# Patient Record
Sex: Male | Born: 1945
Health system: Southern US, Community
[De-identification: ages and names within clinical notes are randomized; demographics above are authoritative.]

## PROBLEM LIST (undated history)

## (undated) DIAGNOSIS — I10 Essential (primary) hypertension: Secondary | ICD-10-CM

## (undated) DIAGNOSIS — I2609 Other pulmonary embolism with acute cor pulmonale: Secondary | ICD-10-CM

## (undated) DIAGNOSIS — E785 Hyperlipidemia, unspecified: Secondary | ICD-10-CM

## (undated) DIAGNOSIS — N179 Acute kidney failure, unspecified: Secondary | ICD-10-CM

## (undated) DIAGNOSIS — J449 Chronic obstructive pulmonary disease, unspecified: Secondary | ICD-10-CM

## (undated) DIAGNOSIS — E669 Obesity, unspecified: Secondary | ICD-10-CM

## (undated) DIAGNOSIS — C649 Malignant neoplasm of unspecified kidney, except renal pelvis: Secondary | ICD-10-CM

## (undated) DIAGNOSIS — I82409 Acute embolism and thrombosis of unspecified deep veins of unspecified lower extremity: Secondary | ICD-10-CM

## (undated) DIAGNOSIS — I5033 Acute on chronic diastolic (congestive) heart failure: Secondary | ICD-10-CM

## (undated) DIAGNOSIS — M1990 Unspecified osteoarthritis, unspecified site: Secondary | ICD-10-CM

## (undated) DIAGNOSIS — Z86711 Personal history of pulmonary embolism: Secondary | ICD-10-CM

## (undated) DIAGNOSIS — M109 Gout, unspecified: Secondary | ICD-10-CM

## (undated) DIAGNOSIS — G4733 Obstructive sleep apnea (adult) (pediatric): Secondary | ICD-10-CM

## (undated) HISTORY — DX: Acute on chronic diastolic (congestive) heart failure: I50.33

## (undated) HISTORY — DX: Gout, unspecified: M10.9

## (undated) HISTORY — DX: Acute embolism and thrombosis of unspecified deep veins of unspecified lower extremity: I82.409

## (undated) HISTORY — DX: Essential (primary) hypertension: I10

## (undated) HISTORY — DX: Other pulmonary embolism with acute cor pulmonale: I26.09

## (undated) HISTORY — DX: Hyperlipidemia, unspecified: E78.5

## (undated) HISTORY — DX: Chronic obstructive pulmonary disease, unspecified: J44.9

## (undated) HISTORY — DX: Unspecified osteoarthritis, unspecified site: M19.90

## (undated) HISTORY — PX: NEPHRECTOMY: SHX65

## (undated) HISTORY — DX: Personal history of pulmonary embolism: Z86.711

## (undated) HISTORY — DX: Obstructive sleep apnea (adult) (pediatric): G47.33

## (undated) HISTORY — DX: Obesity, unspecified: E66.9

## (undated) HISTORY — DX: Malignant neoplasm of unspecified kidney, except renal pelvis: C64.9

## (undated) HISTORY — DX: Acute kidney failure, unspecified: N17.9

---

## 1997-07-17 ENCOUNTER — Emergency Department (HOSPITAL_COMMUNITY): Admission: EM | Admit: 1997-07-17 | Discharge: 1997-07-17 | Payer: Self-pay | Admitting: Emergency Medicine

## 1997-07-19 ENCOUNTER — Inpatient Hospital Stay (HOSPITAL_COMMUNITY): Admission: EM | Admit: 1997-07-19 | Discharge: 1997-07-21 | Payer: Self-pay | Admitting: Emergency Medicine

## 2012-04-13 DIAGNOSIS — E785 Hyperlipidemia, unspecified: Secondary | ICD-10-CM | POA: Diagnosis not present

## 2012-04-13 DIAGNOSIS — Z1212 Encounter for screening for malignant neoplasm of rectum: Secondary | ICD-10-CM | POA: Diagnosis not present

## 2012-04-13 DIAGNOSIS — J449 Chronic obstructive pulmonary disease, unspecified: Secondary | ICD-10-CM | POA: Diagnosis not present

## 2012-04-13 DIAGNOSIS — I10 Essential (primary) hypertension: Secondary | ICD-10-CM | POA: Diagnosis not present

## 2012-04-13 DIAGNOSIS — R3911 Hesitancy of micturition: Secondary | ICD-10-CM | POA: Diagnosis not present

## 2012-04-13 DIAGNOSIS — F17200 Nicotine dependence, unspecified, uncomplicated: Secondary | ICD-10-CM | POA: Diagnosis not present

## 2012-05-13 DIAGNOSIS — M25569 Pain in unspecified knee: Secondary | ICD-10-CM | POA: Diagnosis not present

## 2012-05-13 DIAGNOSIS — S33101A Dislocation of unspecified lumbar vertebra, initial encounter: Secondary | ICD-10-CM | POA: Diagnosis not present

## 2012-05-13 DIAGNOSIS — M62838 Other muscle spasm: Secondary | ICD-10-CM | POA: Diagnosis not present

## 2012-06-10 DIAGNOSIS — M62838 Other muscle spasm: Secondary | ICD-10-CM | POA: Diagnosis not present

## 2012-06-10 DIAGNOSIS — M25569 Pain in unspecified knee: Secondary | ICD-10-CM | POA: Diagnosis not present

## 2012-06-10 DIAGNOSIS — S33101A Dislocation of unspecified lumbar vertebra, initial encounter: Secondary | ICD-10-CM | POA: Diagnosis not present

## 2012-08-05 DIAGNOSIS — M62838 Other muscle spasm: Secondary | ICD-10-CM | POA: Diagnosis not present

## 2012-08-05 DIAGNOSIS — M25569 Pain in unspecified knee: Secondary | ICD-10-CM | POA: Diagnosis not present

## 2012-08-05 DIAGNOSIS — S33101A Dislocation of unspecified lumbar vertebra, initial encounter: Secondary | ICD-10-CM | POA: Diagnosis not present

## 2012-09-02 DIAGNOSIS — M62838 Other muscle spasm: Secondary | ICD-10-CM | POA: Diagnosis not present

## 2012-09-02 DIAGNOSIS — S33101A Dislocation of unspecified lumbar vertebra, initial encounter: Secondary | ICD-10-CM | POA: Diagnosis not present

## 2012-09-02 DIAGNOSIS — M25569 Pain in unspecified knee: Secondary | ICD-10-CM | POA: Diagnosis not present

## 2012-09-04 DIAGNOSIS — S33101A Dislocation of unspecified lumbar vertebra, initial encounter: Secondary | ICD-10-CM | POA: Diagnosis not present

## 2012-09-04 DIAGNOSIS — M25569 Pain in unspecified knee: Secondary | ICD-10-CM | POA: Diagnosis not present

## 2012-09-04 DIAGNOSIS — S339XXA Sprain of unspecified parts of lumbar spine and pelvis, initial encounter: Secondary | ICD-10-CM | POA: Diagnosis not present

## 2012-09-04 DIAGNOSIS — M62838 Other muscle spasm: Secondary | ICD-10-CM | POA: Diagnosis not present

## 2012-09-09 DIAGNOSIS — M62838 Other muscle spasm: Secondary | ICD-10-CM | POA: Diagnosis not present

## 2012-09-09 DIAGNOSIS — S339XXA Sprain of unspecified parts of lumbar spine and pelvis, initial encounter: Secondary | ICD-10-CM | POA: Diagnosis not present

## 2012-09-09 DIAGNOSIS — S33101A Dislocation of unspecified lumbar vertebra, initial encounter: Secondary | ICD-10-CM | POA: Diagnosis not present

## 2012-09-09 DIAGNOSIS — M25569 Pain in unspecified knee: Secondary | ICD-10-CM | POA: Diagnosis not present

## 2012-09-16 DIAGNOSIS — M62838 Other muscle spasm: Secondary | ICD-10-CM | POA: Diagnosis not present

## 2012-09-16 DIAGNOSIS — S339XXA Sprain of unspecified parts of lumbar spine and pelvis, initial encounter: Secondary | ICD-10-CM | POA: Diagnosis not present

## 2012-09-16 DIAGNOSIS — M25569 Pain in unspecified knee: Secondary | ICD-10-CM | POA: Diagnosis not present

## 2012-09-16 DIAGNOSIS — S33101A Dislocation of unspecified lumbar vertebra, initial encounter: Secondary | ICD-10-CM | POA: Diagnosis not present

## 2012-10-14 DIAGNOSIS — S33101A Dislocation of unspecified lumbar vertebra, initial encounter: Secondary | ICD-10-CM | POA: Diagnosis not present

## 2012-10-14 DIAGNOSIS — M62838 Other muscle spasm: Secondary | ICD-10-CM | POA: Diagnosis not present

## 2012-10-14 DIAGNOSIS — M25569 Pain in unspecified knee: Secondary | ICD-10-CM | POA: Diagnosis not present

## 2012-11-11 DIAGNOSIS — S33101A Dislocation of unspecified lumbar vertebra, initial encounter: Secondary | ICD-10-CM | POA: Diagnosis not present

## 2012-11-11 DIAGNOSIS — M62838 Other muscle spasm: Secondary | ICD-10-CM | POA: Diagnosis not present

## 2012-11-11 DIAGNOSIS — M25569 Pain in unspecified knee: Secondary | ICD-10-CM | POA: Diagnosis not present

## 2012-12-09 DIAGNOSIS — M25569 Pain in unspecified knee: Secondary | ICD-10-CM | POA: Diagnosis not present

## 2012-12-09 DIAGNOSIS — M62838 Other muscle spasm: Secondary | ICD-10-CM | POA: Diagnosis not present

## 2012-12-09 DIAGNOSIS — S33101A Dislocation of unspecified lumbar vertebra, initial encounter: Secondary | ICD-10-CM | POA: Diagnosis not present

## 2013-01-06 DIAGNOSIS — S339XXA Sprain of unspecified parts of lumbar spine and pelvis, initial encounter: Secondary | ICD-10-CM | POA: Diagnosis not present

## 2013-01-06 DIAGNOSIS — M62838 Other muscle spasm: Secondary | ICD-10-CM | POA: Diagnosis not present

## 2013-01-06 DIAGNOSIS — S33101A Dislocation of unspecified lumbar vertebra, initial encounter: Secondary | ICD-10-CM | POA: Diagnosis not present

## 2013-01-06 DIAGNOSIS — M25569 Pain in unspecified knee: Secondary | ICD-10-CM | POA: Diagnosis not present

## 2013-01-16 DIAGNOSIS — K219 Gastro-esophageal reflux disease without esophagitis: Secondary | ICD-10-CM | POA: Diagnosis present

## 2013-01-16 DIAGNOSIS — E785 Hyperlipidemia, unspecified: Secondary | ICD-10-CM | POA: Diagnosis present

## 2013-01-16 DIAGNOSIS — E279 Disorder of adrenal gland, unspecified: Secondary | ICD-10-CM | POA: Diagnosis present

## 2013-01-16 DIAGNOSIS — R319 Hematuria, unspecified: Secondary | ICD-10-CM | POA: Diagnosis not present

## 2013-01-16 DIAGNOSIS — I119 Hypertensive heart disease without heart failure: Secondary | ICD-10-CM | POA: Diagnosis not present

## 2013-01-16 DIAGNOSIS — R0602 Shortness of breath: Secondary | ICD-10-CM | POA: Diagnosis not present

## 2013-01-16 DIAGNOSIS — J982 Interstitial emphysema: Secondary | ICD-10-CM | POA: Diagnosis not present

## 2013-01-16 DIAGNOSIS — J449 Chronic obstructive pulmonary disease, unspecified: Secondary | ICD-10-CM | POA: Diagnosis not present

## 2013-01-16 DIAGNOSIS — R0902 Hypoxemia: Secondary | ICD-10-CM | POA: Diagnosis not present

## 2013-01-16 DIAGNOSIS — J189 Pneumonia, unspecified organism: Secondary | ICD-10-CM | POA: Diagnosis not present

## 2013-01-16 DIAGNOSIS — J159 Unspecified bacterial pneumonia: Secondary | ICD-10-CM | POA: Diagnosis not present

## 2013-01-16 DIAGNOSIS — N289 Disorder of kidney and ureter, unspecified: Secondary | ICD-10-CM | POA: Diagnosis not present

## 2013-01-16 DIAGNOSIS — J441 Chronic obstructive pulmonary disease with (acute) exacerbation: Secondary | ICD-10-CM | POA: Diagnosis not present

## 2013-01-16 DIAGNOSIS — Z79899 Other long term (current) drug therapy: Secondary | ICD-10-CM | POA: Diagnosis not present

## 2013-01-16 DIAGNOSIS — J4489 Other specified chronic obstructive pulmonary disease: Secondary | ICD-10-CM | POA: Diagnosis not present

## 2013-01-16 DIAGNOSIS — F17200 Nicotine dependence, unspecified, uncomplicated: Secondary | ICD-10-CM | POA: Diagnosis present

## 2013-01-16 DIAGNOSIS — R0609 Other forms of dyspnea: Secondary | ICD-10-CM | POA: Diagnosis not present

## 2013-01-16 DIAGNOSIS — C649 Malignant neoplasm of unspecified kidney, except renal pelvis: Secondary | ICD-10-CM | POA: Diagnosis not present

## 2013-01-16 DIAGNOSIS — J9600 Acute respiratory failure, unspecified whether with hypoxia or hypercapnia: Secondary | ICD-10-CM | POA: Diagnosis not present

## 2013-01-22 DIAGNOSIS — J449 Chronic obstructive pulmonary disease, unspecified: Secondary | ICD-10-CM | POA: Diagnosis not present

## 2013-01-22 DIAGNOSIS — E785 Hyperlipidemia, unspecified: Secondary | ICD-10-CM | POA: Diagnosis not present

## 2013-01-22 DIAGNOSIS — R7309 Other abnormal glucose: Secondary | ICD-10-CM | POA: Diagnosis not present

## 2013-01-22 DIAGNOSIS — J189 Pneumonia, unspecified organism: Secondary | ICD-10-CM | POA: Diagnosis not present

## 2013-01-22 DIAGNOSIS — I10 Essential (primary) hypertension: Secondary | ICD-10-CM | POA: Diagnosis not present

## 2013-02-01 DIAGNOSIS — N289 Disorder of kidney and ureter, unspecified: Secondary | ICD-10-CM | POA: Diagnosis not present

## 2013-02-01 DIAGNOSIS — N401 Enlarged prostate with lower urinary tract symptoms: Secondary | ICD-10-CM | POA: Diagnosis not present

## 2013-02-03 DIAGNOSIS — S33101A Dislocation of unspecified lumbar vertebra, initial encounter: Secondary | ICD-10-CM | POA: Diagnosis not present

## 2013-02-03 DIAGNOSIS — M25569 Pain in unspecified knee: Secondary | ICD-10-CM | POA: Diagnosis not present

## 2013-02-03 DIAGNOSIS — M62838 Other muscle spasm: Secondary | ICD-10-CM | POA: Diagnosis not present

## 2013-02-23 DIAGNOSIS — I10 Essential (primary) hypertension: Secondary | ICD-10-CM | POA: Diagnosis not present

## 2013-03-03 DIAGNOSIS — S33101A Dislocation of unspecified lumbar vertebra, initial encounter: Secondary | ICD-10-CM | POA: Diagnosis not present

## 2013-03-03 DIAGNOSIS — M25569 Pain in unspecified knee: Secondary | ICD-10-CM | POA: Diagnosis not present

## 2013-03-03 DIAGNOSIS — M62838 Other muscle spasm: Secondary | ICD-10-CM | POA: Diagnosis not present

## 2013-03-11 DIAGNOSIS — N2889 Other specified disorders of kidney and ureter: Secondary | ICD-10-CM

## 2013-03-11 HISTORY — DX: Other specified disorders of kidney and ureter: N28.89

## 2013-03-30 DIAGNOSIS — N289 Disorder of kidney and ureter, unspecified: Secondary | ICD-10-CM | POA: Diagnosis not present

## 2013-03-30 DIAGNOSIS — Z01810 Encounter for preprocedural cardiovascular examination: Secondary | ICD-10-CM | POA: Diagnosis not present

## 2013-03-31 DIAGNOSIS — M25569 Pain in unspecified knee: Secondary | ICD-10-CM | POA: Diagnosis not present

## 2013-03-31 DIAGNOSIS — M62838 Other muscle spasm: Secondary | ICD-10-CM | POA: Diagnosis not present

## 2013-03-31 DIAGNOSIS — S33101A Dislocation of unspecified lumbar vertebra, initial encounter: Secondary | ICD-10-CM | POA: Diagnosis not present

## 2013-04-02 DIAGNOSIS — D4100 Neoplasm of uncertain behavior of unspecified kidney: Secondary | ICD-10-CM | POA: Diagnosis not present

## 2013-04-02 DIAGNOSIS — C649 Malignant neoplasm of unspecified kidney, except renal pelvis: Secondary | ICD-10-CM | POA: Diagnosis not present

## 2013-04-20 DIAGNOSIS — J449 Chronic obstructive pulmonary disease, unspecified: Secondary | ICD-10-CM

## 2013-04-20 DIAGNOSIS — C649 Malignant neoplasm of unspecified kidney, except renal pelvis: Secondary | ICD-10-CM

## 2013-04-20 DIAGNOSIS — N289 Disorder of kidney and ureter, unspecified: Secondary | ICD-10-CM | POA: Diagnosis not present

## 2013-04-20 DIAGNOSIS — F1010 Alcohol abuse, uncomplicated: Secondary | ICD-10-CM | POA: Insufficient documentation

## 2013-04-20 HISTORY — DX: Chronic obstructive pulmonary disease, unspecified: J44.9

## 2013-04-20 HISTORY — DX: Malignant neoplasm of unspecified kidney, except renal pelvis: C64.9

## 2013-04-20 HISTORY — DX: Alcohol abuse, uncomplicated: F10.10

## 2013-04-28 DIAGNOSIS — S33101A Dislocation of unspecified lumbar vertebra, initial encounter: Secondary | ICD-10-CM | POA: Diagnosis not present

## 2013-04-28 DIAGNOSIS — S339XXA Sprain of unspecified parts of lumbar spine and pelvis, initial encounter: Secondary | ICD-10-CM | POA: Diagnosis not present

## 2013-04-28 DIAGNOSIS — M62838 Other muscle spasm: Secondary | ICD-10-CM | POA: Diagnosis not present

## 2013-04-28 DIAGNOSIS — M25569 Pain in unspecified knee: Secondary | ICD-10-CM | POA: Diagnosis not present

## 2013-04-29 DIAGNOSIS — Z09 Encounter for follow-up examination after completed treatment for conditions other than malignant neoplasm: Secondary | ICD-10-CM | POA: Diagnosis not present

## 2013-04-29 DIAGNOSIS — Z9089 Acquired absence of other organs: Secondary | ICD-10-CM | POA: Diagnosis not present

## 2013-04-29 DIAGNOSIS — Z85528 Personal history of other malignant neoplasm of kidney: Secondary | ICD-10-CM | POA: Diagnosis not present

## 2013-04-29 DIAGNOSIS — Z905 Acquired absence of kidney: Secondary | ICD-10-CM | POA: Diagnosis not present

## 2013-04-29 DIAGNOSIS — C649 Malignant neoplasm of unspecified kidney, except renal pelvis: Secondary | ICD-10-CM | POA: Diagnosis not present

## 2013-04-29 DIAGNOSIS — N289 Disorder of kidney and ureter, unspecified: Secondary | ICD-10-CM | POA: Diagnosis not present

## 2013-04-29 DIAGNOSIS — J449 Chronic obstructive pulmonary disease, unspecified: Secondary | ICD-10-CM | POA: Diagnosis not present

## 2013-05-11 DIAGNOSIS — Z1289 Encounter for screening for malignant neoplasm of other sites: Secondary | ICD-10-CM | POA: Diagnosis not present

## 2013-05-11 DIAGNOSIS — E079 Disorder of thyroid, unspecified: Secondary | ICD-10-CM | POA: Diagnosis not present

## 2013-05-11 DIAGNOSIS — Z905 Acquired absence of kidney: Secondary | ICD-10-CM | POA: Diagnosis not present

## 2013-05-11 DIAGNOSIS — C649 Malignant neoplasm of unspecified kidney, except renal pelvis: Secondary | ICD-10-CM | POA: Diagnosis not present

## 2013-05-11 DIAGNOSIS — Z0389 Encounter for observation for other suspected diseases and conditions ruled out: Secondary | ICD-10-CM | POA: Diagnosis not present

## 2013-06-01 DIAGNOSIS — E785 Hyperlipidemia, unspecified: Secondary | ICD-10-CM | POA: Diagnosis not present

## 2013-06-01 DIAGNOSIS — J449 Chronic obstructive pulmonary disease, unspecified: Secondary | ICD-10-CM | POA: Diagnosis not present

## 2013-06-01 DIAGNOSIS — M25569 Pain in unspecified knee: Secondary | ICD-10-CM | POA: Diagnosis not present

## 2013-06-01 DIAGNOSIS — I10 Essential (primary) hypertension: Secondary | ICD-10-CM | POA: Diagnosis not present

## 2013-06-01 DIAGNOSIS — M25469 Effusion, unspecified knee: Secondary | ICD-10-CM | POA: Diagnosis not present

## 2013-06-01 DIAGNOSIS — S99919A Unspecified injury of unspecified ankle, initial encounter: Secondary | ICD-10-CM | POA: Diagnosis not present

## 2013-06-01 DIAGNOSIS — R937 Abnormal findings on diagnostic imaging of other parts of musculoskeletal system: Secondary | ICD-10-CM | POA: Diagnosis not present

## 2013-06-01 DIAGNOSIS — M129 Arthropathy, unspecified: Secondary | ICD-10-CM | POA: Diagnosis not present

## 2013-06-01 DIAGNOSIS — S8990XA Unspecified injury of unspecified lower leg, initial encounter: Secondary | ICD-10-CM | POA: Diagnosis not present

## 2013-06-01 DIAGNOSIS — N182 Chronic kidney disease, stage 2 (mild): Secondary | ICD-10-CM | POA: Diagnosis not present

## 2013-06-23 DIAGNOSIS — I10 Essential (primary) hypertension: Secondary | ICD-10-CM | POA: Diagnosis not present

## 2013-06-23 DIAGNOSIS — E785 Hyperlipidemia, unspecified: Secondary | ICD-10-CM | POA: Diagnosis not present

## 2013-06-23 DIAGNOSIS — J449 Chronic obstructive pulmonary disease, unspecified: Secondary | ICD-10-CM | POA: Diagnosis not present

## 2013-06-23 DIAGNOSIS — Z905 Acquired absence of kidney: Secondary | ICD-10-CM | POA: Diagnosis not present

## 2013-06-23 DIAGNOSIS — I498 Other specified cardiac arrhythmias: Secondary | ICD-10-CM | POA: Diagnosis not present

## 2013-07-05 DIAGNOSIS — R011 Cardiac murmur, unspecified: Secondary | ICD-10-CM | POA: Diagnosis not present

## 2013-07-28 DIAGNOSIS — M999 Biomechanical lesion, unspecified: Secondary | ICD-10-CM | POA: Diagnosis not present

## 2013-07-28 DIAGNOSIS — S339XXA Sprain of unspecified parts of lumbar spine and pelvis, initial encounter: Secondary | ICD-10-CM | POA: Diagnosis not present

## 2013-07-30 DIAGNOSIS — M999 Biomechanical lesion, unspecified: Secondary | ICD-10-CM | POA: Diagnosis not present

## 2013-07-30 DIAGNOSIS — S339XXA Sprain of unspecified parts of lumbar spine and pelvis, initial encounter: Secondary | ICD-10-CM | POA: Diagnosis not present

## 2013-08-04 DIAGNOSIS — M999 Biomechanical lesion, unspecified: Secondary | ICD-10-CM | POA: Diagnosis not present

## 2013-08-04 DIAGNOSIS — S339XXA Sprain of unspecified parts of lumbar spine and pelvis, initial encounter: Secondary | ICD-10-CM | POA: Diagnosis not present

## 2013-08-06 DIAGNOSIS — M999 Biomechanical lesion, unspecified: Secondary | ICD-10-CM | POA: Diagnosis not present

## 2013-08-06 DIAGNOSIS — S339XXA Sprain of unspecified parts of lumbar spine and pelvis, initial encounter: Secondary | ICD-10-CM | POA: Diagnosis not present

## 2013-08-11 DIAGNOSIS — S339XXA Sprain of unspecified parts of lumbar spine and pelvis, initial encounter: Secondary | ICD-10-CM | POA: Diagnosis not present

## 2013-08-11 DIAGNOSIS — M999 Biomechanical lesion, unspecified: Secondary | ICD-10-CM | POA: Diagnosis not present

## 2013-08-13 DIAGNOSIS — M999 Biomechanical lesion, unspecified: Secondary | ICD-10-CM | POA: Diagnosis not present

## 2013-08-13 DIAGNOSIS — S339XXA Sprain of unspecified parts of lumbar spine and pelvis, initial encounter: Secondary | ICD-10-CM | POA: Diagnosis not present

## 2013-08-18 DIAGNOSIS — S339XXA Sprain of unspecified parts of lumbar spine and pelvis, initial encounter: Secondary | ICD-10-CM | POA: Diagnosis not present

## 2013-08-18 DIAGNOSIS — M999 Biomechanical lesion, unspecified: Secondary | ICD-10-CM | POA: Diagnosis not present

## 2013-08-20 DIAGNOSIS — S339XXA Sprain of unspecified parts of lumbar spine and pelvis, initial encounter: Secondary | ICD-10-CM | POA: Diagnosis not present

## 2013-08-20 DIAGNOSIS — M999 Biomechanical lesion, unspecified: Secondary | ICD-10-CM | POA: Diagnosis not present

## 2013-08-24 DIAGNOSIS — M25449 Effusion, unspecified hand: Secondary | ICD-10-CM | POA: Diagnosis not present

## 2013-08-24 DIAGNOSIS — Z85528 Personal history of other malignant neoplasm of kidney: Secondary | ICD-10-CM | POA: Diagnosis not present

## 2013-08-24 DIAGNOSIS — C649 Malignant neoplasm of unspecified kidney, except renal pelvis: Secondary | ICD-10-CM | POA: Diagnosis not present

## 2013-08-24 DIAGNOSIS — Q348 Other specified congenital malformations of respiratory system: Secondary | ICD-10-CM | POA: Diagnosis not present

## 2013-08-24 DIAGNOSIS — Z0389 Encounter for observation for other suspected diseases and conditions ruled out: Secondary | ICD-10-CM | POA: Diagnosis not present

## 2013-08-24 DIAGNOSIS — K7689 Other specified diseases of liver: Secondary | ICD-10-CM | POA: Diagnosis not present

## 2013-08-24 DIAGNOSIS — Z483 Aftercare following surgery for neoplasm: Secondary | ICD-10-CM | POA: Diagnosis not present

## 2013-08-25 DIAGNOSIS — S339XXA Sprain of unspecified parts of lumbar spine and pelvis, initial encounter: Secondary | ICD-10-CM | POA: Diagnosis not present

## 2013-08-25 DIAGNOSIS — M999 Biomechanical lesion, unspecified: Secondary | ICD-10-CM | POA: Diagnosis not present

## 2013-08-27 DIAGNOSIS — S339XXA Sprain of unspecified parts of lumbar spine and pelvis, initial encounter: Secondary | ICD-10-CM | POA: Diagnosis not present

## 2013-08-27 DIAGNOSIS — M999 Biomechanical lesion, unspecified: Secondary | ICD-10-CM | POA: Diagnosis not present

## 2013-09-01 DIAGNOSIS — S339XXA Sprain of unspecified parts of lumbar spine and pelvis, initial encounter: Secondary | ICD-10-CM | POA: Diagnosis not present

## 2013-09-01 DIAGNOSIS — M999 Biomechanical lesion, unspecified: Secondary | ICD-10-CM | POA: Diagnosis not present

## 2013-09-02 DIAGNOSIS — C649 Malignant neoplasm of unspecified kidney, except renal pelvis: Secondary | ICD-10-CM | POA: Diagnosis not present

## 2013-09-02 DIAGNOSIS — I10 Essential (primary) hypertension: Secondary | ICD-10-CM | POA: Diagnosis not present

## 2013-09-02 DIAGNOSIS — M1990 Unspecified osteoarthritis, unspecified site: Secondary | ICD-10-CM | POA: Diagnosis not present

## 2013-09-03 DIAGNOSIS — S339XXA Sprain of unspecified parts of lumbar spine and pelvis, initial encounter: Secondary | ICD-10-CM | POA: Diagnosis not present

## 2013-09-03 DIAGNOSIS — M999 Biomechanical lesion, unspecified: Secondary | ICD-10-CM | POA: Diagnosis not present

## 2013-09-08 DIAGNOSIS — S339XXA Sprain of unspecified parts of lumbar spine and pelvis, initial encounter: Secondary | ICD-10-CM | POA: Diagnosis not present

## 2013-09-08 DIAGNOSIS — M999 Biomechanical lesion, unspecified: Secondary | ICD-10-CM | POA: Diagnosis not present

## 2013-09-10 DIAGNOSIS — M999 Biomechanical lesion, unspecified: Secondary | ICD-10-CM | POA: Diagnosis not present

## 2013-09-10 DIAGNOSIS — S339XXA Sprain of unspecified parts of lumbar spine and pelvis, initial encounter: Secondary | ICD-10-CM | POA: Diagnosis not present

## 2013-09-15 DIAGNOSIS — M999 Biomechanical lesion, unspecified: Secondary | ICD-10-CM | POA: Diagnosis not present

## 2013-09-15 DIAGNOSIS — S339XXA Sprain of unspecified parts of lumbar spine and pelvis, initial encounter: Secondary | ICD-10-CM | POA: Diagnosis not present

## 2013-09-17 DIAGNOSIS — M999 Biomechanical lesion, unspecified: Secondary | ICD-10-CM | POA: Diagnosis not present

## 2013-09-17 DIAGNOSIS — S339XXA Sprain of unspecified parts of lumbar spine and pelvis, initial encounter: Secondary | ICD-10-CM | POA: Diagnosis not present

## 2013-09-22 DIAGNOSIS — S339XXA Sprain of unspecified parts of lumbar spine and pelvis, initial encounter: Secondary | ICD-10-CM | POA: Diagnosis not present

## 2013-09-22 DIAGNOSIS — M999 Biomechanical lesion, unspecified: Secondary | ICD-10-CM | POA: Diagnosis not present

## 2013-10-06 DIAGNOSIS — M999 Biomechanical lesion, unspecified: Secondary | ICD-10-CM | POA: Diagnosis not present

## 2013-10-06 DIAGNOSIS — S339XXA Sprain of unspecified parts of lumbar spine and pelvis, initial encounter: Secondary | ICD-10-CM | POA: Diagnosis not present

## 2013-10-12 DIAGNOSIS — Z905 Acquired absence of kidney: Secondary | ICD-10-CM | POA: Diagnosis not present

## 2013-10-12 DIAGNOSIS — N289 Disorder of kidney and ureter, unspecified: Secondary | ICD-10-CM | POA: Diagnosis not present

## 2013-10-12 DIAGNOSIS — C649 Malignant neoplasm of unspecified kidney, except renal pelvis: Secondary | ICD-10-CM | POA: Diagnosis not present

## 2013-10-13 DIAGNOSIS — S339XXA Sprain of unspecified parts of lumbar spine and pelvis, initial encounter: Secondary | ICD-10-CM | POA: Diagnosis not present

## 2013-10-13 DIAGNOSIS — M999 Biomechanical lesion, unspecified: Secondary | ICD-10-CM | POA: Diagnosis not present

## 2013-10-19 DIAGNOSIS — C649 Malignant neoplasm of unspecified kidney, except renal pelvis: Secondary | ICD-10-CM | POA: Diagnosis not present

## 2013-10-19 DIAGNOSIS — N289 Disorder of kidney and ureter, unspecified: Secondary | ICD-10-CM | POA: Diagnosis not present

## 2013-10-19 DIAGNOSIS — Z85528 Personal history of other malignant neoplasm of kidney: Secondary | ICD-10-CM | POA: Diagnosis not present

## 2013-10-19 DIAGNOSIS — Z905 Acquired absence of kidney: Secondary | ICD-10-CM | POA: Diagnosis not present

## 2013-10-19 DIAGNOSIS — Z483 Aftercare following surgery for neoplasm: Secondary | ICD-10-CM | POA: Diagnosis not present

## 2013-10-27 DIAGNOSIS — M999 Biomechanical lesion, unspecified: Secondary | ICD-10-CM | POA: Diagnosis not present

## 2013-10-27 DIAGNOSIS — S339XXA Sprain of unspecified parts of lumbar spine and pelvis, initial encounter: Secondary | ICD-10-CM | POA: Diagnosis not present

## 2013-11-10 DIAGNOSIS — S339XXA Sprain of unspecified parts of lumbar spine and pelvis, initial encounter: Secondary | ICD-10-CM | POA: Diagnosis not present

## 2013-11-10 DIAGNOSIS — M999 Biomechanical lesion, unspecified: Secondary | ICD-10-CM | POA: Diagnosis not present

## 2013-12-08 DIAGNOSIS — S339XXA Sprain of unspecified parts of lumbar spine and pelvis, initial encounter: Secondary | ICD-10-CM | POA: Diagnosis not present

## 2013-12-08 DIAGNOSIS — M999 Biomechanical lesion, unspecified: Secondary | ICD-10-CM | POA: Diagnosis not present

## 2013-12-09 DIAGNOSIS — Z23 Encounter for immunization: Secondary | ICD-10-CM | POA: Diagnosis not present

## 2013-12-09 DIAGNOSIS — I10 Essential (primary) hypertension: Secondary | ICD-10-CM | POA: Diagnosis not present

## 2013-12-21 DIAGNOSIS — C649 Malignant neoplasm of unspecified kidney, except renal pelvis: Secondary | ICD-10-CM | POA: Diagnosis not present

## 2013-12-29 DIAGNOSIS — S339XXA Sprain of unspecified parts of lumbar spine and pelvis, initial encounter: Secondary | ICD-10-CM | POA: Diagnosis not present

## 2013-12-29 DIAGNOSIS — M999 Biomechanical lesion, unspecified: Secondary | ICD-10-CM | POA: Diagnosis not present

## 2014-01-26 DIAGNOSIS — M25561 Pain in right knee: Secondary | ICD-10-CM | POA: Diagnosis not present

## 2014-01-26 DIAGNOSIS — S335XXD Sprain of ligaments of lumbar spine, subsequent encounter: Secondary | ICD-10-CM | POA: Diagnosis not present

## 2014-02-28 DIAGNOSIS — S335XXD Sprain of ligaments of lumbar spine, subsequent encounter: Secondary | ICD-10-CM | POA: Diagnosis not present

## 2014-02-28 DIAGNOSIS — M25561 Pain in right knee: Secondary | ICD-10-CM | POA: Diagnosis not present

## 2014-03-18 DIAGNOSIS — I10 Essential (primary) hypertension: Secondary | ICD-10-CM | POA: Diagnosis not present

## 2014-03-30 DIAGNOSIS — M25561 Pain in right knee: Secondary | ICD-10-CM | POA: Diagnosis not present

## 2014-03-30 DIAGNOSIS — S335XXD Sprain of ligaments of lumbar spine, subsequent encounter: Secondary | ICD-10-CM | POA: Diagnosis not present

## 2014-04-06 DIAGNOSIS — S335XXD Sprain of ligaments of lumbar spine, subsequent encounter: Secondary | ICD-10-CM | POA: Diagnosis not present

## 2014-04-06 DIAGNOSIS — M25561 Pain in right knee: Secondary | ICD-10-CM | POA: Diagnosis not present

## 2014-04-20 DIAGNOSIS — M25561 Pain in right knee: Secondary | ICD-10-CM | POA: Diagnosis not present

## 2014-04-20 DIAGNOSIS — S335XXD Sprain of ligaments of lumbar spine, subsequent encounter: Secondary | ICD-10-CM | POA: Diagnosis not present

## 2014-04-26 DIAGNOSIS — J449 Chronic obstructive pulmonary disease, unspecified: Secondary | ICD-10-CM | POA: Diagnosis not present

## 2014-04-26 DIAGNOSIS — R945 Abnormal results of liver function studies: Secondary | ICD-10-CM | POA: Insufficient documentation

## 2014-04-26 DIAGNOSIS — R7989 Other specified abnormal findings of blood chemistry: Secondary | ICD-10-CM

## 2014-04-26 DIAGNOSIS — C641 Malignant neoplasm of right kidney, except renal pelvis: Secondary | ICD-10-CM | POA: Diagnosis not present

## 2014-04-26 DIAGNOSIS — K7689 Other specified diseases of liver: Secondary | ICD-10-CM | POA: Diagnosis not present

## 2014-04-26 DIAGNOSIS — Z905 Acquired absence of kidney: Secondary | ICD-10-CM | POA: Diagnosis not present

## 2014-04-26 HISTORY — DX: Other specified abnormal findings of blood chemistry: R79.89

## 2014-04-26 HISTORY — DX: Abnormal results of liver function studies: R94.5

## 2014-06-03 DIAGNOSIS — I10 Essential (primary) hypertension: Secondary | ICD-10-CM | POA: Diagnosis not present

## 2014-06-03 DIAGNOSIS — J449 Chronic obstructive pulmonary disease, unspecified: Secondary | ICD-10-CM | POA: Diagnosis not present

## 2014-06-03 DIAGNOSIS — E785 Hyperlipidemia, unspecified: Secondary | ICD-10-CM | POA: Diagnosis not present

## 2014-06-03 DIAGNOSIS — Z1212 Encounter for screening for malignant neoplasm of rectum: Secondary | ICD-10-CM | POA: Diagnosis not present

## 2014-06-03 DIAGNOSIS — Z1389 Encounter for screening for other disorder: Secondary | ICD-10-CM | POA: Diagnosis not present

## 2014-06-03 DIAGNOSIS — N400 Enlarged prostate without lower urinary tract symptoms: Secondary | ICD-10-CM | POA: Diagnosis not present

## 2014-06-03 DIAGNOSIS — N183 Chronic kidney disease, stage 3 (moderate): Secondary | ICD-10-CM | POA: Diagnosis not present

## 2014-06-13 DIAGNOSIS — M1711 Unilateral primary osteoarthritis, right knee: Secondary | ICD-10-CM | POA: Diagnosis not present

## 2014-06-21 DIAGNOSIS — R748 Abnormal levels of other serum enzymes: Secondary | ICD-10-CM | POA: Diagnosis not present

## 2014-09-12 DIAGNOSIS — E785 Hyperlipidemia, unspecified: Secondary | ICD-10-CM | POA: Diagnosis not present

## 2014-09-12 DIAGNOSIS — I10 Essential (primary) hypertension: Secondary | ICD-10-CM | POA: Diagnosis not present

## 2014-09-12 DIAGNOSIS — M1990 Unspecified osteoarthritis, unspecified site: Secondary | ICD-10-CM | POA: Diagnosis not present

## 2014-10-04 DIAGNOSIS — Z0389 Encounter for observation for other suspected diseases and conditions ruled out: Secondary | ICD-10-CM | POA: Diagnosis not present

## 2014-10-04 DIAGNOSIS — R222 Localized swelling, mass and lump, trunk: Secondary | ICD-10-CM | POA: Diagnosis not present

## 2014-10-04 DIAGNOSIS — J984 Other disorders of lung: Secondary | ICD-10-CM | POA: Diagnosis not present

## 2014-10-04 DIAGNOSIS — R935 Abnormal findings on diagnostic imaging of other abdominal regions, including retroperitoneum: Secondary | ICD-10-CM | POA: Diagnosis not present

## 2014-10-04 DIAGNOSIS — C641 Malignant neoplasm of right kidney, except renal pelvis: Secondary | ICD-10-CM | POA: Diagnosis not present

## 2014-10-04 DIAGNOSIS — Z905 Acquired absence of kidney: Secondary | ICD-10-CM | POA: Diagnosis not present

## 2014-10-04 DIAGNOSIS — R7989 Other specified abnormal findings of blood chemistry: Secondary | ICD-10-CM | POA: Diagnosis not present

## 2014-10-04 DIAGNOSIS — K760 Fatty (change of) liver, not elsewhere classified: Secondary | ICD-10-CM | POA: Diagnosis not present

## 2014-10-28 DIAGNOSIS — Z905 Acquired absence of kidney: Secondary | ICD-10-CM | POA: Diagnosis not present

## 2014-10-28 DIAGNOSIS — Z85528 Personal history of other malignant neoplasm of kidney: Secondary | ICD-10-CM | POA: Diagnosis not present

## 2014-10-28 DIAGNOSIS — Z87891 Personal history of nicotine dependence: Secondary | ICD-10-CM | POA: Diagnosis not present

## 2014-10-28 DIAGNOSIS — C641 Malignant neoplasm of right kidney, except renal pelvis: Secondary | ICD-10-CM | POA: Diagnosis not present

## 2014-10-28 DIAGNOSIS — R222 Localized swelling, mass and lump, trunk: Secondary | ICD-10-CM | POA: Diagnosis not present

## 2014-10-28 DIAGNOSIS — Z885 Allergy status to narcotic agent status: Secondary | ICD-10-CM | POA: Diagnosis not present

## 2014-10-28 DIAGNOSIS — R0602 Shortness of breath: Secondary | ICD-10-CM | POA: Diagnosis not present

## 2014-10-28 DIAGNOSIS — J449 Chronic obstructive pulmonary disease, unspecified: Secondary | ICD-10-CM | POA: Diagnosis not present

## 2014-11-08 DIAGNOSIS — R222 Localized swelling, mass and lump, trunk: Secondary | ICD-10-CM | POA: Diagnosis not present

## 2014-11-08 DIAGNOSIS — Z01818 Encounter for other preprocedural examination: Secondary | ICD-10-CM | POA: Diagnosis not present

## 2014-11-09 DIAGNOSIS — R222 Localized swelling, mass and lump, trunk: Secondary | ICD-10-CM | POA: Diagnosis not present

## 2014-11-09 DIAGNOSIS — D142 Benign neoplasm of trachea: Secondary | ICD-10-CM | POA: Diagnosis not present

## 2014-12-19 DIAGNOSIS — E785 Hyperlipidemia, unspecified: Secondary | ICD-10-CM | POA: Diagnosis not present

## 2014-12-19 DIAGNOSIS — Z23 Encounter for immunization: Secondary | ICD-10-CM | POA: Diagnosis not present

## 2014-12-19 DIAGNOSIS — I10 Essential (primary) hypertension: Secondary | ICD-10-CM | POA: Diagnosis not present

## 2014-12-19 DIAGNOSIS — G4733 Obstructive sleep apnea (adult) (pediatric): Secondary | ICD-10-CM | POA: Diagnosis not present

## 2015-01-04 DIAGNOSIS — G4733 Obstructive sleep apnea (adult) (pediatric): Secondary | ICD-10-CM | POA: Diagnosis not present

## 2015-01-20 DIAGNOSIS — J449 Chronic obstructive pulmonary disease, unspecified: Secondary | ICD-10-CM | POA: Diagnosis not present

## 2015-01-20 DIAGNOSIS — G4733 Obstructive sleep apnea (adult) (pediatric): Secondary | ICD-10-CM | POA: Diagnosis not present

## 2015-01-20 DIAGNOSIS — R5383 Other fatigue: Secondary | ICD-10-CM | POA: Diagnosis not present

## 2015-02-09 DIAGNOSIS — G4733 Obstructive sleep apnea (adult) (pediatric): Secondary | ICD-10-CM | POA: Diagnosis not present

## 2015-02-21 DIAGNOSIS — J449 Chronic obstructive pulmonary disease, unspecified: Secondary | ICD-10-CM | POA: Diagnosis not present

## 2015-02-21 DIAGNOSIS — G4733 Obstructive sleep apnea (adult) (pediatric): Secondary | ICD-10-CM | POA: Diagnosis not present

## 2015-02-21 DIAGNOSIS — R5383 Other fatigue: Secondary | ICD-10-CM | POA: Diagnosis not present

## 2015-03-01 DIAGNOSIS — G4733 Obstructive sleep apnea (adult) (pediatric): Secondary | ICD-10-CM | POA: Diagnosis not present

## 2015-03-20 DIAGNOSIS — J439 Emphysema, unspecified: Secondary | ICD-10-CM | POA: Diagnosis not present

## 2015-03-20 DIAGNOSIS — M170 Bilateral primary osteoarthritis of knee: Secondary | ICD-10-CM | POA: Diagnosis not present

## 2015-04-04 DIAGNOSIS — C641 Malignant neoplasm of right kidney, except renal pelvis: Secondary | ICD-10-CM | POA: Diagnosis not present

## 2015-04-04 DIAGNOSIS — Z905 Acquired absence of kidney: Secondary | ICD-10-CM | POA: Diagnosis not present

## 2015-04-04 DIAGNOSIS — Z7982 Long term (current) use of aspirin: Secondary | ICD-10-CM | POA: Diagnosis not present

## 2015-04-12 DIAGNOSIS — G4733 Obstructive sleep apnea (adult) (pediatric): Secondary | ICD-10-CM | POA: Diagnosis not present

## 2015-04-12 DIAGNOSIS — R5383 Other fatigue: Secondary | ICD-10-CM | POA: Diagnosis not present

## 2015-04-12 DIAGNOSIS — J449 Chronic obstructive pulmonary disease, unspecified: Secondary | ICD-10-CM | POA: Diagnosis not present

## 2015-06-28 DIAGNOSIS — M150 Primary generalized (osteo)arthritis: Secondary | ICD-10-CM | POA: Diagnosis not present

## 2015-06-28 DIAGNOSIS — N183 Chronic kidney disease, stage 3 (moderate): Secondary | ICD-10-CM | POA: Diagnosis not present

## 2015-06-28 DIAGNOSIS — Z85528 Personal history of other malignant neoplasm of kidney: Secondary | ICD-10-CM | POA: Diagnosis not present

## 2015-06-28 DIAGNOSIS — J439 Emphysema, unspecified: Secondary | ICD-10-CM | POA: Diagnosis not present

## 2015-06-28 DIAGNOSIS — I10 Essential (primary) hypertension: Secondary | ICD-10-CM | POA: Diagnosis not present

## 2015-06-28 DIAGNOSIS — Z125 Encounter for screening for malignant neoplasm of prostate: Secondary | ICD-10-CM | POA: Diagnosis not present

## 2015-06-28 DIAGNOSIS — N400 Enlarged prostate without lower urinary tract symptoms: Secondary | ICD-10-CM | POA: Diagnosis not present

## 2015-06-28 DIAGNOSIS — E7800 Pure hypercholesterolemia, unspecified: Secondary | ICD-10-CM | POA: Diagnosis not present

## 2015-06-28 DIAGNOSIS — Z1389 Encounter for screening for other disorder: Secondary | ICD-10-CM | POA: Diagnosis not present

## 2015-06-28 DIAGNOSIS — G4733 Obstructive sleep apnea (adult) (pediatric): Secondary | ICD-10-CM | POA: Diagnosis not present

## 2015-07-05 DIAGNOSIS — Z905 Acquired absence of kidney: Secondary | ICD-10-CM | POA: Diagnosis not present

## 2015-07-05 DIAGNOSIS — G4730 Sleep apnea, unspecified: Secondary | ICD-10-CM | POA: Diagnosis not present

## 2015-07-18 DIAGNOSIS — R5383 Other fatigue: Secondary | ICD-10-CM | POA: Diagnosis not present

## 2015-07-18 DIAGNOSIS — G4733 Obstructive sleep apnea (adult) (pediatric): Secondary | ICD-10-CM | POA: Diagnosis not present

## 2015-07-18 DIAGNOSIS — J449 Chronic obstructive pulmonary disease, unspecified: Secondary | ICD-10-CM | POA: Diagnosis not present

## 2015-07-27 DIAGNOSIS — Z1211 Encounter for screening for malignant neoplasm of colon: Secondary | ICD-10-CM | POA: Diagnosis not present

## 2015-07-27 DIAGNOSIS — K5730 Diverticulosis of large intestine without perforation or abscess without bleeding: Secondary | ICD-10-CM | POA: Diagnosis not present

## 2015-07-27 DIAGNOSIS — G4730 Sleep apnea, unspecified: Secondary | ICD-10-CM | POA: Diagnosis not present

## 2015-07-27 DIAGNOSIS — Z86010 Personal history of colonic polyps: Secondary | ICD-10-CM | POA: Diagnosis not present

## 2015-07-27 DIAGNOSIS — Z79899 Other long term (current) drug therapy: Secondary | ICD-10-CM | POA: Diagnosis not present

## 2015-07-27 DIAGNOSIS — D126 Benign neoplasm of colon, unspecified: Secondary | ICD-10-CM | POA: Diagnosis not present

## 2015-07-27 DIAGNOSIS — I10 Essential (primary) hypertension: Secondary | ICD-10-CM | POA: Diagnosis not present

## 2015-07-27 DIAGNOSIS — M1990 Unspecified osteoarthritis, unspecified site: Secondary | ICD-10-CM | POA: Diagnosis not present

## 2015-07-27 DIAGNOSIS — F17220 Nicotine dependence, chewing tobacco, uncomplicated: Secondary | ICD-10-CM | POA: Diagnosis not present

## 2015-07-27 DIAGNOSIS — D122 Benign neoplasm of ascending colon: Secondary | ICD-10-CM | POA: Diagnosis not present

## 2015-07-27 DIAGNOSIS — J449 Chronic obstructive pulmonary disease, unspecified: Secondary | ICD-10-CM | POA: Diagnosis not present

## 2015-07-27 DIAGNOSIS — N400 Enlarged prostate without lower urinary tract symptoms: Secondary | ICD-10-CM | POA: Diagnosis not present

## 2015-08-09 DIAGNOSIS — J449 Chronic obstructive pulmonary disease, unspecified: Secondary | ICD-10-CM | POA: Diagnosis not present

## 2015-08-09 DIAGNOSIS — R5383 Other fatigue: Secondary | ICD-10-CM | POA: Diagnosis not present

## 2015-08-09 DIAGNOSIS — G4733 Obstructive sleep apnea (adult) (pediatric): Secondary | ICD-10-CM | POA: Diagnosis not present

## 2015-08-10 DIAGNOSIS — G4733 Obstructive sleep apnea (adult) (pediatric): Secondary | ICD-10-CM | POA: Diagnosis not present

## 2015-08-15 DIAGNOSIS — G4733 Obstructive sleep apnea (adult) (pediatric): Secondary | ICD-10-CM | POA: Diagnosis not present

## 2015-09-12 DIAGNOSIS — R7989 Other specified abnormal findings of blood chemistry: Secondary | ICD-10-CM | POA: Diagnosis not present

## 2015-09-12 DIAGNOSIS — D7589 Other specified diseases of blood and blood-forming organs: Secondary | ICD-10-CM | POA: Diagnosis not present

## 2015-09-12 DIAGNOSIS — Z905 Acquired absence of kidney: Secondary | ICD-10-CM | POA: Diagnosis not present

## 2015-09-12 DIAGNOSIS — C641 Malignant neoplasm of right kidney, except renal pelvis: Secondary | ICD-10-CM | POA: Diagnosis not present

## 2015-09-12 DIAGNOSIS — N2889 Other specified disorders of kidney and ureter: Secondary | ICD-10-CM | POA: Diagnosis not present

## 2015-09-12 DIAGNOSIS — R911 Solitary pulmonary nodule: Secondary | ICD-10-CM | POA: Diagnosis not present

## 2015-10-05 DIAGNOSIS — I10 Essential (primary) hypertension: Secondary | ICD-10-CM | POA: Diagnosis not present

## 2015-11-17 DIAGNOSIS — G4733 Obstructive sleep apnea (adult) (pediatric): Secondary | ICD-10-CM | POA: Diagnosis not present

## 2015-11-17 DIAGNOSIS — J449 Chronic obstructive pulmonary disease, unspecified: Secondary | ICD-10-CM | POA: Diagnosis not present

## 2015-11-17 DIAGNOSIS — R5383 Other fatigue: Secondary | ICD-10-CM | POA: Diagnosis not present

## 2015-11-21 DIAGNOSIS — G4733 Obstructive sleep apnea (adult) (pediatric): Secondary | ICD-10-CM | POA: Diagnosis not present

## 2015-12-01 ENCOUNTER — Other Ambulatory Visit: Payer: Self-pay

## 2015-12-13 DIAGNOSIS — H2513 Age-related nuclear cataract, bilateral: Secondary | ICD-10-CM | POA: Diagnosis not present

## 2015-12-13 DIAGNOSIS — H524 Presbyopia: Secondary | ICD-10-CM | POA: Diagnosis not present

## 2015-12-19 DIAGNOSIS — N281 Cyst of kidney, acquired: Secondary | ICD-10-CM | POA: Diagnosis not present

## 2015-12-19 DIAGNOSIS — Z0389 Encounter for observation for other suspected diseases and conditions ruled out: Secondary | ICD-10-CM | POA: Diagnosis not present

## 2015-12-19 DIAGNOSIS — C641 Malignant neoplasm of right kidney, except renal pelvis: Secondary | ICD-10-CM | POA: Diagnosis not present

## 2015-12-19 DIAGNOSIS — Z905 Acquired absence of kidney: Secondary | ICD-10-CM | POA: Diagnosis not present

## 2015-12-19 DIAGNOSIS — R7989 Other specified abnormal findings of blood chemistry: Secondary | ICD-10-CM | POA: Diagnosis not present

## 2015-12-19 DIAGNOSIS — Z7982 Long term (current) use of aspirin: Secondary | ICD-10-CM | POA: Diagnosis not present

## 2016-01-29 DIAGNOSIS — Z23 Encounter for immunization: Secondary | ICD-10-CM | POA: Diagnosis not present

## 2016-01-29 DIAGNOSIS — E7800 Pure hypercholesterolemia, unspecified: Secondary | ICD-10-CM | POA: Diagnosis not present

## 2016-01-29 DIAGNOSIS — I10 Essential (primary) hypertension: Secondary | ICD-10-CM | POA: Diagnosis not present

## 2016-02-08 DIAGNOSIS — G4733 Obstructive sleep apnea (adult) (pediatric): Secondary | ICD-10-CM | POA: Diagnosis not present

## 2016-02-09 DIAGNOSIS — R5383 Other fatigue: Secondary | ICD-10-CM | POA: Diagnosis not present

## 2016-02-09 DIAGNOSIS — J449 Chronic obstructive pulmonary disease, unspecified: Secondary | ICD-10-CM | POA: Diagnosis not present

## 2016-02-09 DIAGNOSIS — G4733 Obstructive sleep apnea (adult) (pediatric): Secondary | ICD-10-CM | POA: Diagnosis not present

## 2016-05-10 DIAGNOSIS — R5383 Other fatigue: Secondary | ICD-10-CM | POA: Diagnosis not present

## 2016-05-10 DIAGNOSIS — M25569 Pain in unspecified knee: Secondary | ICD-10-CM | POA: Diagnosis not present

## 2016-05-10 DIAGNOSIS — G4733 Obstructive sleep apnea (adult) (pediatric): Secondary | ICD-10-CM | POA: Diagnosis not present

## 2016-05-15 DIAGNOSIS — M1711 Unilateral primary osteoarthritis, right knee: Secondary | ICD-10-CM | POA: Diagnosis not present

## 2016-05-15 DIAGNOSIS — M1712 Unilateral primary osteoarthritis, left knee: Secondary | ICD-10-CM | POA: Diagnosis not present

## 2016-06-18 DIAGNOSIS — R600 Localized edema: Secondary | ICD-10-CM | POA: Diagnosis not present

## 2016-06-18 DIAGNOSIS — C641 Malignant neoplasm of right kidney, except renal pelvis: Secondary | ICD-10-CM | POA: Diagnosis not present

## 2016-06-18 DIAGNOSIS — K760 Fatty (change of) liver, not elsewhere classified: Secondary | ICD-10-CM | POA: Diagnosis not present

## 2016-06-18 DIAGNOSIS — Z905 Acquired absence of kidney: Secondary | ICD-10-CM | POA: Diagnosis not present

## 2016-06-18 DIAGNOSIS — R7989 Other specified abnormal findings of blood chemistry: Secondary | ICD-10-CM | POA: Diagnosis not present

## 2016-06-18 DIAGNOSIS — N281 Cyst of kidney, acquired: Secondary | ICD-10-CM | POA: Diagnosis not present

## 2016-06-20 DIAGNOSIS — R600 Localized edema: Secondary | ICD-10-CM | POA: Diagnosis not present

## 2016-06-20 DIAGNOSIS — N189 Chronic kidney disease, unspecified: Secondary | ICD-10-CM | POA: Diagnosis not present

## 2016-06-26 DIAGNOSIS — M1712 Unilateral primary osteoarthritis, left knee: Secondary | ICD-10-CM | POA: Diagnosis not present

## 2016-06-26 DIAGNOSIS — M1711 Unilateral primary osteoarthritis, right knee: Secondary | ICD-10-CM | POA: Diagnosis not present

## 2016-07-05 DIAGNOSIS — M1711 Unilateral primary osteoarthritis, right knee: Secondary | ICD-10-CM | POA: Diagnosis not present

## 2016-07-12 DIAGNOSIS — M1712 Unilateral primary osteoarthritis, left knee: Secondary | ICD-10-CM | POA: Diagnosis not present

## 2016-08-23 DIAGNOSIS — E669 Obesity, unspecified: Secondary | ICD-10-CM | POA: Diagnosis not present

## 2016-08-23 DIAGNOSIS — Z85528 Personal history of other malignant neoplasm of kidney: Secondary | ICD-10-CM | POA: Diagnosis not present

## 2016-08-23 DIAGNOSIS — G4733 Obstructive sleep apnea (adult) (pediatric): Secondary | ICD-10-CM | POA: Diagnosis not present

## 2016-08-23 DIAGNOSIS — Z6830 Body mass index (BMI) 30.0-30.9, adult: Secondary | ICD-10-CM | POA: Diagnosis not present

## 2016-08-23 DIAGNOSIS — J449 Chronic obstructive pulmonary disease, unspecified: Secondary | ICD-10-CM | POA: Diagnosis not present

## 2016-08-23 DIAGNOSIS — N182 Chronic kidney disease, stage 2 (mild): Secondary | ICD-10-CM | POA: Diagnosis not present

## 2016-08-23 DIAGNOSIS — R609 Edema, unspecified: Secondary | ICD-10-CM | POA: Diagnosis not present

## 2016-08-23 DIAGNOSIS — Z1389 Encounter for screening for other disorder: Secondary | ICD-10-CM | POA: Diagnosis not present

## 2016-08-23 DIAGNOSIS — E7800 Pure hypercholesterolemia, unspecified: Secondary | ICD-10-CM | POA: Diagnosis not present

## 2016-08-23 DIAGNOSIS — M150 Primary generalized (osteo)arthritis: Secondary | ICD-10-CM | POA: Diagnosis not present

## 2016-08-23 DIAGNOSIS — N400 Enlarged prostate without lower urinary tract symptoms: Secondary | ICD-10-CM | POA: Diagnosis not present

## 2016-08-23 DIAGNOSIS — I10 Essential (primary) hypertension: Secondary | ICD-10-CM | POA: Diagnosis not present

## 2016-08-28 DIAGNOSIS — M1711 Unilateral primary osteoarthritis, right knee: Secondary | ICD-10-CM | POA: Diagnosis not present

## 2016-08-28 DIAGNOSIS — M1712 Unilateral primary osteoarthritis, left knee: Secondary | ICD-10-CM | POA: Diagnosis not present

## 2016-09-11 DIAGNOSIS — I509 Heart failure, unspecified: Secondary | ICD-10-CM | POA: Diagnosis not present

## 2016-09-11 DIAGNOSIS — R0602 Shortness of breath: Secondary | ICD-10-CM | POA: Diagnosis not present

## 2016-09-11 DIAGNOSIS — I358 Other nonrheumatic aortic valve disorders: Secondary | ICD-10-CM | POA: Diagnosis not present

## 2016-09-20 DIAGNOSIS — R069 Unspecified abnormalities of breathing: Secondary | ICD-10-CM | POA: Diagnosis not present

## 2016-09-21 DIAGNOSIS — R600 Localized edema: Secondary | ICD-10-CM | POA: Diagnosis not present

## 2016-09-21 DIAGNOSIS — D649 Anemia, unspecified: Secondary | ICD-10-CM | POA: Diagnosis not present

## 2016-09-21 DIAGNOSIS — R0602 Shortness of breath: Secondary | ICD-10-CM | POA: Insufficient documentation

## 2016-09-21 DIAGNOSIS — I272 Pulmonary hypertension, unspecified: Secondary | ICD-10-CM | POA: Diagnosis not present

## 2016-09-21 DIAGNOSIS — I361 Nonrheumatic tricuspid (valve) insufficiency: Secondary | ICD-10-CM | POA: Diagnosis not present

## 2016-09-21 DIAGNOSIS — E871 Hypo-osmolality and hyponatremia: Secondary | ICD-10-CM | POA: Insufficient documentation

## 2016-09-21 DIAGNOSIS — N289 Disorder of kidney and ureter, unspecified: Secondary | ICD-10-CM | POA: Diagnosis not present

## 2016-09-21 HISTORY — DX: Hypo-osmolality and hyponatremia: E87.1

## 2016-09-21 HISTORY — DX: Shortness of breath: R06.02

## 2016-09-21 HISTORY — DX: Anemia, unspecified: D64.9

## 2016-09-21 HISTORY — DX: Localized edema: R60.0

## 2016-09-22 DIAGNOSIS — I82403 Acute embolism and thrombosis of unspecified deep veins of lower extremity, bilateral: Secondary | ICD-10-CM | POA: Insufficient documentation

## 2016-09-22 DIAGNOSIS — D649 Anemia, unspecified: Secondary | ICD-10-CM | POA: Diagnosis not present

## 2016-09-22 DIAGNOSIS — E871 Hypo-osmolality and hyponatremia: Secondary | ICD-10-CM | POA: Diagnosis not present

## 2016-09-22 DIAGNOSIS — R600 Localized edema: Secondary | ICD-10-CM | POA: Diagnosis not present

## 2016-09-22 HISTORY — DX: Acute embolism and thrombosis of unspecified deep veins of lower extremity, bilateral: I82.403

## 2016-09-23 DIAGNOSIS — I2699 Other pulmonary embolism without acute cor pulmonale: Secondary | ICD-10-CM | POA: Insufficient documentation

## 2016-09-23 DIAGNOSIS — I82409 Acute embolism and thrombosis of unspecified deep veins of unspecified lower extremity: Secondary | ICD-10-CM | POA: Diagnosis not present

## 2016-09-23 DIAGNOSIS — R600 Localized edema: Secondary | ICD-10-CM | POA: Diagnosis not present

## 2016-09-23 DIAGNOSIS — D649 Anemia, unspecified: Secondary | ICD-10-CM | POA: Diagnosis not present

## 2016-09-23 DIAGNOSIS — E871 Hypo-osmolality and hyponatremia: Secondary | ICD-10-CM | POA: Diagnosis not present

## 2016-09-23 HISTORY — DX: Other pulmonary embolism without acute cor pulmonale: I26.99

## 2016-09-24 DIAGNOSIS — F10931 Alcohol use, unspecified with withdrawal delirium: Secondary | ICD-10-CM | POA: Insufficient documentation

## 2016-09-24 DIAGNOSIS — D649 Anemia, unspecified: Secondary | ICD-10-CM | POA: Diagnosis not present

## 2016-09-24 DIAGNOSIS — E871 Hypo-osmolality and hyponatremia: Secondary | ICD-10-CM | POA: Diagnosis not present

## 2016-09-24 DIAGNOSIS — F10231 Alcohol dependence with withdrawal delirium: Secondary | ICD-10-CM

## 2016-09-24 DIAGNOSIS — R600 Localized edema: Secondary | ICD-10-CM | POA: Diagnosis not present

## 2016-09-24 HISTORY — DX: Alcohol use, unspecified with withdrawal delirium: F10.931

## 2016-09-24 HISTORY — DX: Alcohol dependence with withdrawal delirium: F10.231

## 2016-09-25 DIAGNOSIS — R000 Tachycardia, unspecified: Secondary | ICD-10-CM | POA: Diagnosis not present

## 2016-09-25 DIAGNOSIS — I471 Supraventricular tachycardia, unspecified: Secondary | ICD-10-CM

## 2016-09-25 DIAGNOSIS — I491 Atrial premature depolarization: Secondary | ICD-10-CM | POA: Diagnosis not present

## 2016-09-25 DIAGNOSIS — D631 Anemia in chronic kidney disease: Secondary | ICD-10-CM | POA: Diagnosis not present

## 2016-09-25 DIAGNOSIS — R609 Edema, unspecified: Secondary | ICD-10-CM | POA: Diagnosis not present

## 2016-09-25 DIAGNOSIS — D649 Anemia, unspecified: Secondary | ICD-10-CM | POA: Diagnosis not present

## 2016-09-25 HISTORY — DX: Supraventricular tachycardia, unspecified: I47.10

## 2016-09-25 HISTORY — DX: Supraventricular tachycardia: I47.1

## 2016-09-26 DIAGNOSIS — D649 Anemia, unspecified: Secondary | ICD-10-CM | POA: Diagnosis not present

## 2016-09-26 DIAGNOSIS — D631 Anemia in chronic kidney disease: Secondary | ICD-10-CM | POA: Diagnosis not present

## 2016-09-26 DIAGNOSIS — M7989 Other specified soft tissue disorders: Secondary | ICD-10-CM | POA: Diagnosis not present

## 2016-09-26 DIAGNOSIS — R609 Edema, unspecified: Secondary | ICD-10-CM | POA: Diagnosis not present

## 2016-09-27 DIAGNOSIS — D649 Anemia, unspecified: Secondary | ICD-10-CM | POA: Diagnosis not present

## 2016-09-27 DIAGNOSIS — D631 Anemia in chronic kidney disease: Secondary | ICD-10-CM | POA: Diagnosis not present

## 2016-09-27 DIAGNOSIS — R05 Cough: Secondary | ICD-10-CM | POA: Diagnosis not present

## 2016-09-27 DIAGNOSIS — R609 Edema, unspecified: Secondary | ICD-10-CM | POA: Diagnosis not present

## 2016-09-28 DIAGNOSIS — Z85528 Personal history of other malignant neoplasm of kidney: Secondary | ICD-10-CM

## 2016-09-28 DIAGNOSIS — J9811 Atelectasis: Secondary | ICD-10-CM | POA: Diagnosis not present

## 2016-09-28 DIAGNOSIS — I82403 Acute embolism and thrombosis of unspecified deep veins of lower extremity, bilateral: Secondary | ICD-10-CM | POA: Diagnosis not present

## 2016-09-28 DIAGNOSIS — E8779 Other fluid overload: Secondary | ICD-10-CM | POA: Diagnosis not present

## 2016-09-28 DIAGNOSIS — E871 Hypo-osmolality and hyponatremia: Secondary | ICD-10-CM | POA: Diagnosis not present

## 2016-09-28 HISTORY — DX: Personal history of other malignant neoplasm of kidney: Z85.528

## 2016-09-29 DIAGNOSIS — I82403 Acute embolism and thrombosis of unspecified deep veins of lower extremity, bilateral: Secondary | ICD-10-CM | POA: Diagnosis not present

## 2016-09-29 DIAGNOSIS — I82402 Acute embolism and thrombosis of unspecified deep veins of left lower extremity: Secondary | ICD-10-CM | POA: Diagnosis not present

## 2016-09-29 DIAGNOSIS — Z7901 Long term (current) use of anticoagulants: Secondary | ICD-10-CM | POA: Diagnosis not present

## 2016-09-29 DIAGNOSIS — E871 Hypo-osmolality and hyponatremia: Secondary | ICD-10-CM | POA: Diagnosis not present

## 2016-09-29 DIAGNOSIS — D62 Acute posthemorrhagic anemia: Secondary | ICD-10-CM | POA: Diagnosis not present

## 2016-09-29 DIAGNOSIS — E8779 Other fluid overload: Secondary | ICD-10-CM | POA: Diagnosis not present

## 2016-09-30 DIAGNOSIS — R609 Edema, unspecified: Secondary | ICD-10-CM | POA: Diagnosis not present

## 2016-09-30 DIAGNOSIS — I82433 Acute embolism and thrombosis of popliteal vein, bilateral: Secondary | ICD-10-CM | POA: Diagnosis not present

## 2016-09-30 DIAGNOSIS — R0602 Shortness of breath: Secondary | ICD-10-CM | POA: Diagnosis not present

## 2016-09-30 DIAGNOSIS — I2699 Other pulmonary embolism without acute cor pulmonale: Secondary | ICD-10-CM | POA: Diagnosis not present

## 2016-09-30 DIAGNOSIS — D649 Anemia, unspecified: Secondary | ICD-10-CM | POA: Diagnosis not present

## 2016-09-30 DIAGNOSIS — K8020 Calculus of gallbladder without cholecystitis without obstruction: Secondary | ICD-10-CM | POA: Diagnosis not present

## 2016-09-30 DIAGNOSIS — N281 Cyst of kidney, acquired: Secondary | ICD-10-CM | POA: Diagnosis not present

## 2016-09-30 DIAGNOSIS — D631 Anemia in chronic kidney disease: Secondary | ICD-10-CM | POA: Diagnosis not present

## 2016-09-30 DIAGNOSIS — R600 Localized edema: Secondary | ICD-10-CM | POA: Diagnosis not present

## 2016-09-30 DIAGNOSIS — C641 Malignant neoplasm of right kidney, except renal pelvis: Secondary | ICD-10-CM | POA: Diagnosis not present

## 2016-10-01 DIAGNOSIS — Z452 Encounter for adjustment and management of vascular access device: Secondary | ICD-10-CM | POA: Diagnosis not present

## 2016-10-01 DIAGNOSIS — D631 Anemia in chronic kidney disease: Secondary | ICD-10-CM | POA: Diagnosis not present

## 2016-10-01 DIAGNOSIS — K36 Other appendicitis: Secondary | ICD-10-CM

## 2016-10-01 DIAGNOSIS — R531 Weakness: Secondary | ICD-10-CM | POA: Diagnosis not present

## 2016-10-01 DIAGNOSIS — R609 Edema, unspecified: Secondary | ICD-10-CM | POA: Diagnosis not present

## 2016-10-01 DIAGNOSIS — R935 Abnormal findings on diagnostic imaging of other abdominal regions, including retroperitoneum: Secondary | ICD-10-CM | POA: Diagnosis not present

## 2016-10-01 HISTORY — DX: Other appendicitis: K36

## 2016-10-02 DIAGNOSIS — R4182 Altered mental status, unspecified: Secondary | ICD-10-CM | POA: Diagnosis not present

## 2016-10-02 DIAGNOSIS — J90 Pleural effusion, not elsewhere classified: Secondary | ICD-10-CM | POA: Diagnosis not present

## 2016-10-02 DIAGNOSIS — R000 Tachycardia, unspecified: Secondary | ICD-10-CM | POA: Diagnosis not present

## 2016-10-02 DIAGNOSIS — D464 Refractory anemia, unspecified: Secondary | ICD-10-CM | POA: Diagnosis not present

## 2016-10-02 DIAGNOSIS — R918 Other nonspecific abnormal finding of lung field: Secondary | ICD-10-CM | POA: Diagnosis not present

## 2016-10-03 DIAGNOSIS — G4733 Obstructive sleep apnea (adult) (pediatric): Secondary | ICD-10-CM | POA: Insufficient documentation

## 2016-10-03 DIAGNOSIS — Z87898 Personal history of other specified conditions: Secondary | ICD-10-CM | POA: Diagnosis not present

## 2016-10-03 DIAGNOSIS — Z9119 Patient's noncompliance with other medical treatment and regimen: Secondary | ICD-10-CM | POA: Diagnosis not present

## 2016-10-03 DIAGNOSIS — J9601 Acute respiratory failure with hypoxia: Secondary | ICD-10-CM | POA: Diagnosis not present

## 2016-10-03 DIAGNOSIS — R4182 Altered mental status, unspecified: Secondary | ICD-10-CM | POA: Diagnosis not present

## 2016-10-03 DIAGNOSIS — I272 Pulmonary hypertension, unspecified: Secondary | ICD-10-CM | POA: Diagnosis not present

## 2016-10-03 DIAGNOSIS — I129 Hypertensive chronic kidney disease with stage 1 through stage 4 chronic kidney disease, or unspecified chronic kidney disease: Secondary | ICD-10-CM | POA: Diagnosis not present

## 2016-10-03 DIAGNOSIS — I2699 Other pulmonary embolism without acute cor pulmonale: Secondary | ICD-10-CM | POA: Diagnosis not present

## 2016-10-03 DIAGNOSIS — N183 Chronic kidney disease, stage 3 (moderate): Secondary | ICD-10-CM | POA: Diagnosis not present

## 2016-10-03 HISTORY — DX: Obstructive sleep apnea (adult) (pediatric): G47.33

## 2016-10-08 DIAGNOSIS — Z86711 Personal history of pulmonary embolism: Secondary | ICD-10-CM | POA: Diagnosis not present

## 2016-10-08 DIAGNOSIS — R54 Age-related physical debility: Secondary | ICD-10-CM | POA: Diagnosis not present

## 2016-10-08 DIAGNOSIS — I10 Essential (primary) hypertension: Secondary | ICD-10-CM | POA: Diagnosis not present

## 2016-10-08 DIAGNOSIS — I82409 Acute embolism and thrombosis of unspecified deep veins of unspecified lower extremity: Secondary | ICD-10-CM | POA: Diagnosis not present

## 2016-10-08 DIAGNOSIS — R339 Retention of urine, unspecified: Secondary | ICD-10-CM | POA: Diagnosis not present

## 2016-10-08 DIAGNOSIS — Z86718 Personal history of other venous thrombosis and embolism: Secondary | ICD-10-CM | POA: Diagnosis not present

## 2016-10-08 DIAGNOSIS — N390 Urinary tract infection, site not specified: Secondary | ICD-10-CM | POA: Diagnosis not present

## 2016-10-08 DIAGNOSIS — R600 Localized edema: Secondary | ICD-10-CM | POA: Diagnosis not present

## 2016-10-08 DIAGNOSIS — I2699 Other pulmonary embolism without acute cor pulmonale: Secondary | ICD-10-CM | POA: Diagnosis not present

## 2016-10-08 DIAGNOSIS — M25569 Pain in unspecified knee: Secondary | ICD-10-CM | POA: Diagnosis not present

## 2016-10-08 DIAGNOSIS — M109 Gout, unspecified: Secondary | ICD-10-CM | POA: Diagnosis not present

## 2016-10-08 DIAGNOSIS — G9341 Metabolic encephalopathy: Secondary | ICD-10-CM | POA: Diagnosis not present

## 2016-10-08 DIAGNOSIS — R41841 Cognitive communication deficit: Secondary | ICD-10-CM | POA: Diagnosis not present

## 2016-10-08 DIAGNOSIS — M6281 Muscle weakness (generalized): Secondary | ICD-10-CM | POA: Diagnosis not present

## 2016-10-08 DIAGNOSIS — R52 Pain, unspecified: Secondary | ICD-10-CM | POA: Diagnosis not present

## 2016-10-08 DIAGNOSIS — M179 Osteoarthritis of knee, unspecified: Secondary | ICD-10-CM | POA: Diagnosis not present

## 2016-10-08 DIAGNOSIS — N401 Enlarged prostate with lower urinary tract symptoms: Secondary | ICD-10-CM | POA: Diagnosis not present

## 2016-10-08 DIAGNOSIS — R296 Repeated falls: Secondary | ICD-10-CM | POA: Diagnosis not present

## 2016-10-08 DIAGNOSIS — R269 Unspecified abnormalities of gait and mobility: Secondary | ICD-10-CM | POA: Diagnosis not present

## 2016-10-08 DIAGNOSIS — I5020 Unspecified systolic (congestive) heart failure: Secondary | ICD-10-CM | POA: Diagnosis not present

## 2016-10-08 DIAGNOSIS — R9430 Abnormal result of cardiovascular function study, unspecified: Secondary | ICD-10-CM | POA: Diagnosis not present

## 2016-10-08 DIAGNOSIS — D649 Anemia, unspecified: Secondary | ICD-10-CM | POA: Diagnosis not present

## 2016-10-08 DIAGNOSIS — R5383 Other fatigue: Secondary | ICD-10-CM | POA: Diagnosis not present

## 2016-10-08 DIAGNOSIS — F0391 Unspecified dementia with behavioral disturbance: Secondary | ICD-10-CM | POA: Diagnosis not present

## 2016-10-08 DIAGNOSIS — Z6827 Body mass index (BMI) 27.0-27.9, adult: Secondary | ICD-10-CM | POA: Diagnosis not present

## 2016-10-08 DIAGNOSIS — R531 Weakness: Secondary | ICD-10-CM | POA: Diagnosis not present

## 2016-10-08 DIAGNOSIS — M1000 Idiopathic gout, unspecified site: Secondary | ICD-10-CM | POA: Diagnosis not present

## 2016-10-08 DIAGNOSIS — J9690 Respiratory failure, unspecified, unspecified whether with hypoxia or hypercapnia: Secondary | ICD-10-CM | POA: Diagnosis not present

## 2016-10-08 DIAGNOSIS — W19XXXA Unspecified fall, initial encounter: Secondary | ICD-10-CM | POA: Diagnosis not present

## 2016-10-08 DIAGNOSIS — M2560 Stiffness of unspecified joint, not elsewhere classified: Secondary | ICD-10-CM | POA: Diagnosis not present

## 2016-10-08 DIAGNOSIS — M1711 Unilateral primary osteoarthritis, right knee: Secondary | ICD-10-CM | POA: Diagnosis not present

## 2016-10-08 DIAGNOSIS — M79641 Pain in right hand: Secondary | ICD-10-CM | POA: Diagnosis not present

## 2016-10-08 DIAGNOSIS — Z85528 Personal history of other malignant neoplasm of kidney: Secondary | ICD-10-CM | POA: Diagnosis not present

## 2016-10-08 DIAGNOSIS — R278 Other lack of coordination: Secondary | ICD-10-CM | POA: Diagnosis not present

## 2016-10-16 ENCOUNTER — Other Ambulatory Visit: Payer: Self-pay | Admitting: *Deleted

## 2016-10-16 NOTE — Patient Outreach (Signed)
[facility]) Care Management  10/16/2016  Ralph Powell [birth year] [id number]   Communication with Jean Rosenthal, SW at facility.  She reports patient has multiple health issues and is very ill, she has set up a meeting with patient and wife to see what the thoughts around discharge planning or if patient will be LTC.   Plan RNCM requested that SW update RNCM with discharge planning needs and RNCM can make visit with patient and wife As indicated to discuss [facility] care management needs.  She agrees. RNCM will follow up as indicated.  Royetta Crochet. Laymond Purser, RN, BSN, [place] [phone]) Business Cell  [phone]) Toll Free Office

## 2016-10-17 DIAGNOSIS — D649 Anemia, unspecified: Secondary | ICD-10-CM | POA: Diagnosis not present

## 2016-10-17 DIAGNOSIS — R600 Localized edema: Secondary | ICD-10-CM | POA: Diagnosis not present

## 2016-10-17 DIAGNOSIS — M109 Gout, unspecified: Secondary | ICD-10-CM | POA: Diagnosis not present

## 2016-10-17 DIAGNOSIS — Z85528 Personal history of other malignant neoplasm of kidney: Secondary | ICD-10-CM | POA: Diagnosis not present

## 2016-10-17 DIAGNOSIS — Z86718 Personal history of other venous thrombosis and embolism: Secondary | ICD-10-CM | POA: Diagnosis not present

## 2016-10-17 DIAGNOSIS — M79641 Pain in right hand: Secondary | ICD-10-CM | POA: Diagnosis not present

## 2016-10-17 DIAGNOSIS — R54 Age-related physical debility: Secondary | ICD-10-CM | POA: Diagnosis not present

## 2016-10-31 DIAGNOSIS — R54 Age-related physical debility: Secondary | ICD-10-CM | POA: Diagnosis not present

## 2016-10-31 DIAGNOSIS — D649 Anemia, unspecified: Secondary | ICD-10-CM | POA: Diagnosis not present

## 2016-10-31 DIAGNOSIS — I82409 Acute embolism and thrombosis of unspecified deep veins of unspecified lower extremity: Secondary | ICD-10-CM | POA: Diagnosis not present

## 2016-10-31 DIAGNOSIS — I5020 Unspecified systolic (congestive) heart failure: Secondary | ICD-10-CM | POA: Diagnosis not present

## 2016-11-14 DIAGNOSIS — Z6827 Body mass index (BMI) 27.0-27.9, adult: Secondary | ICD-10-CM | POA: Diagnosis not present

## 2016-11-14 DIAGNOSIS — M25569 Pain in unspecified knee: Secondary | ICD-10-CM | POA: Diagnosis not present

## 2016-11-14 DIAGNOSIS — I10 Essential (primary) hypertension: Secondary | ICD-10-CM | POA: Diagnosis not present

## 2016-11-14 DIAGNOSIS — I5020 Unspecified systolic (congestive) heart failure: Secondary | ICD-10-CM | POA: Diagnosis not present

## 2016-11-14 DIAGNOSIS — D649 Anemia, unspecified: Secondary | ICD-10-CM | POA: Diagnosis not present

## 2016-11-25 ENCOUNTER — Other Ambulatory Visit: Payer: Self-pay | Admitting: *Deleted

## 2016-11-25 NOTE — Patient Outreach (Signed)
Call from Jean Rosenthal, SW at facility.  Patient continues to rehab at facility. His wife is wanting to take patient home. SW feels patient needs LTC and continues to discuss patient needs with wife.  Instructed SW to refer patient to [facility] care management if patient will discharge home and not remain LTC.  She verbalizes understanding and agreement to referral to [facility] care management upon discharge.  Plan to sign off and SW agrees to send referral if patient does discharge home.  Royetta Crochet. Laymond Purser, RN, BSN, [place] [phone]) Business Cell  [phone]) Toll Free Office

## 2016-11-28 DIAGNOSIS — F0391 Unspecified dementia with behavioral disturbance: Secondary | ICD-10-CM | POA: Diagnosis not present

## 2016-11-28 DIAGNOSIS — W19XXXA Unspecified fall, initial encounter: Secondary | ICD-10-CM | POA: Diagnosis not present

## 2016-11-28 DIAGNOSIS — R296 Repeated falls: Secondary | ICD-10-CM | POA: Diagnosis not present

## 2016-11-28 DIAGNOSIS — R269 Unspecified abnormalities of gait and mobility: Secondary | ICD-10-CM | POA: Diagnosis not present

## 2016-11-28 DIAGNOSIS — M6281 Muscle weakness (generalized): Secondary | ICD-10-CM | POA: Diagnosis not present

## 2016-11-28 DIAGNOSIS — R54 Age-related physical debility: Secondary | ICD-10-CM | POA: Diagnosis not present

## 2016-12-04 DIAGNOSIS — M1711 Unilateral primary osteoarthritis, right knee: Secondary | ICD-10-CM | POA: Diagnosis not present

## 2016-12-18 DIAGNOSIS — N390 Urinary tract infection, site not specified: Secondary | ICD-10-CM | POA: Diagnosis not present

## 2016-12-18 DIAGNOSIS — N401 Enlarged prostate with lower urinary tract symptoms: Secondary | ICD-10-CM | POA: Diagnosis not present

## 2016-12-18 DIAGNOSIS — R339 Retention of urine, unspecified: Secondary | ICD-10-CM | POA: Diagnosis not present

## 2016-12-26 DIAGNOSIS — Z86711 Personal history of pulmonary embolism: Secondary | ICD-10-CM | POA: Diagnosis not present

## 2016-12-26 DIAGNOSIS — Z85528 Personal history of other malignant neoplasm of kidney: Secondary | ICD-10-CM | POA: Diagnosis not present

## 2016-12-26 DIAGNOSIS — M6281 Muscle weakness (generalized): Secondary | ICD-10-CM | POA: Diagnosis not present

## 2016-12-26 DIAGNOSIS — J9690 Respiratory failure, unspecified, unspecified whether with hypoxia or hypercapnia: Secondary | ICD-10-CM | POA: Diagnosis not present

## 2016-12-27 DIAGNOSIS — N401 Enlarged prostate with lower urinary tract symptoms: Secondary | ICD-10-CM | POA: Diagnosis not present

## 2016-12-27 DIAGNOSIS — R339 Retention of urine, unspecified: Secondary | ICD-10-CM | POA: Diagnosis not present

## 2017-01-16 DIAGNOSIS — S80922D Unspecified superficial injury of left lower leg, subsequent encounter: Secondary | ICD-10-CM | POA: Diagnosis not present

## 2017-01-16 DIAGNOSIS — D539 Nutritional anemia, unspecified: Secondary | ICD-10-CM | POA: Diagnosis not present

## 2017-01-16 DIAGNOSIS — I130 Hypertensive heart and chronic kidney disease with heart failure and stage 1 through stage 4 chronic kidney disease, or unspecified chronic kidney disease: Secondary | ICD-10-CM | POA: Diagnosis not present

## 2017-01-16 DIAGNOSIS — L89620 Pressure ulcer of left heel, unstageable: Secondary | ICD-10-CM | POA: Diagnosis not present

## 2017-01-17 DIAGNOSIS — L89620 Pressure ulcer of left heel, unstageable: Secondary | ICD-10-CM | POA: Diagnosis not present

## 2017-01-17 DIAGNOSIS — S80922D Unspecified superficial injury of left lower leg, subsequent encounter: Secondary | ICD-10-CM | POA: Diagnosis not present

## 2017-01-20 DIAGNOSIS — N401 Enlarged prostate with lower urinary tract symptoms: Secondary | ICD-10-CM | POA: Diagnosis not present

## 2017-01-20 DIAGNOSIS — L89620 Pressure ulcer of left heel, unstageable: Secondary | ICD-10-CM | POA: Diagnosis not present

## 2017-01-20 DIAGNOSIS — S80922D Unspecified superficial injury of left lower leg, subsequent encounter: Secondary | ICD-10-CM | POA: Diagnosis not present

## 2017-01-20 DIAGNOSIS — R339 Retention of urine, unspecified: Secondary | ICD-10-CM | POA: Diagnosis not present

## 2017-01-21 DIAGNOSIS — R5381 Other malaise: Secondary | ICD-10-CM | POA: Diagnosis not present

## 2017-01-21 DIAGNOSIS — I2699 Other pulmonary embolism without acute cor pulmonale: Secondary | ICD-10-CM | POA: Diagnosis not present

## 2017-01-22 DIAGNOSIS — S80922D Unspecified superficial injury of left lower leg, subsequent encounter: Secondary | ICD-10-CM | POA: Diagnosis not present

## 2017-01-22 DIAGNOSIS — L89620 Pressure ulcer of left heel, unstageable: Secondary | ICD-10-CM | POA: Diagnosis not present

## 2017-01-24 DIAGNOSIS — L89620 Pressure ulcer of left heel, unstageable: Secondary | ICD-10-CM | POA: Diagnosis not present

## 2017-01-24 DIAGNOSIS — S80922D Unspecified superficial injury of left lower leg, subsequent encounter: Secondary | ICD-10-CM | POA: Diagnosis not present

## 2017-01-28 DIAGNOSIS — L89620 Pressure ulcer of left heel, unstageable: Secondary | ICD-10-CM | POA: Diagnosis not present

## 2017-01-28 DIAGNOSIS — S80922D Unspecified superficial injury of left lower leg, subsequent encounter: Secondary | ICD-10-CM | POA: Diagnosis not present

## 2017-01-30 DIAGNOSIS — L89620 Pressure ulcer of left heel, unstageable: Secondary | ICD-10-CM | POA: Diagnosis not present

## 2017-01-30 DIAGNOSIS — S80922D Unspecified superficial injury of left lower leg, subsequent encounter: Secondary | ICD-10-CM | POA: Diagnosis not present

## 2017-01-31 DIAGNOSIS — R609 Edema, unspecified: Secondary | ICD-10-CM | POA: Diagnosis not present

## 2017-02-03 DIAGNOSIS — J449 Chronic obstructive pulmonary disease, unspecified: Secondary | ICD-10-CM | POA: Diagnosis not present

## 2017-02-03 DIAGNOSIS — G4730 Sleep apnea, unspecified: Secondary | ICD-10-CM | POA: Diagnosis not present

## 2017-02-03 DIAGNOSIS — I130 Hypertensive heart and chronic kidney disease with heart failure and stage 1 through stage 4 chronic kidney disease, or unspecified chronic kidney disease: Secondary | ICD-10-CM | POA: Diagnosis not present

## 2017-02-03 DIAGNOSIS — Z87891 Personal history of nicotine dependence: Secondary | ICD-10-CM | POA: Diagnosis not present

## 2017-02-03 DIAGNOSIS — I509 Heart failure, unspecified: Secondary | ICD-10-CM | POA: Diagnosis not present

## 2017-02-03 DIAGNOSIS — L89892 Pressure ulcer of other site, stage 2: Secondary | ICD-10-CM | POA: Diagnosis not present

## 2017-02-03 DIAGNOSIS — M109 Gout, unspecified: Secondary | ICD-10-CM | POA: Diagnosis not present

## 2017-02-03 DIAGNOSIS — M1990 Unspecified osteoarthritis, unspecified site: Secondary | ICD-10-CM | POA: Diagnosis not present

## 2017-02-03 DIAGNOSIS — L89612 Pressure ulcer of right heel, stage 2: Secondary | ICD-10-CM | POA: Diagnosis not present

## 2017-02-03 DIAGNOSIS — N183 Chronic kidney disease, stage 3 (moderate): Secondary | ICD-10-CM | POA: Diagnosis not present

## 2017-02-03 DIAGNOSIS — L89620 Pressure ulcer of left heel, unstageable: Secondary | ICD-10-CM | POA: Diagnosis not present

## 2017-02-04 DIAGNOSIS — S80922D Unspecified superficial injury of left lower leg, subsequent encounter: Secondary | ICD-10-CM | POA: Diagnosis not present

## 2017-02-04 DIAGNOSIS — L89620 Pressure ulcer of left heel, unstageable: Secondary | ICD-10-CM | POA: Diagnosis not present

## 2017-02-06 DIAGNOSIS — S80922D Unspecified superficial injury of left lower leg, subsequent encounter: Secondary | ICD-10-CM | POA: Diagnosis not present

## 2017-02-06 DIAGNOSIS — L89620 Pressure ulcer of left heel, unstageable: Secondary | ICD-10-CM | POA: Diagnosis not present

## 2017-02-07 DIAGNOSIS — S80922D Unspecified superficial injury of left lower leg, subsequent encounter: Secondary | ICD-10-CM | POA: Diagnosis not present

## 2017-02-07 DIAGNOSIS — L89620 Pressure ulcer of left heel, unstageable: Secondary | ICD-10-CM | POA: Diagnosis not present

## 2017-02-10 DIAGNOSIS — L89612 Pressure ulcer of right heel, stage 2: Secondary | ICD-10-CM | POA: Diagnosis not present

## 2017-02-10 DIAGNOSIS — S80922D Unspecified superficial injury of left lower leg, subsequent encounter: Secondary | ICD-10-CM | POA: Diagnosis not present

## 2017-02-10 DIAGNOSIS — L89620 Pressure ulcer of left heel, unstageable: Secondary | ICD-10-CM | POA: Diagnosis not present

## 2017-02-10 DIAGNOSIS — L89892 Pressure ulcer of other site, stage 2: Secondary | ICD-10-CM | POA: Diagnosis not present

## 2017-02-11 DIAGNOSIS — S80922D Unspecified superficial injury of left lower leg, subsequent encounter: Secondary | ICD-10-CM | POA: Diagnosis not present

## 2017-02-11 DIAGNOSIS — L89620 Pressure ulcer of left heel, unstageable: Secondary | ICD-10-CM | POA: Diagnosis not present

## 2017-02-12 DIAGNOSIS — L89620 Pressure ulcer of left heel, unstageable: Secondary | ICD-10-CM | POA: Diagnosis not present

## 2017-02-12 DIAGNOSIS — S80922D Unspecified superficial injury of left lower leg, subsequent encounter: Secondary | ICD-10-CM | POA: Diagnosis not present

## 2017-02-13 DIAGNOSIS — S80922D Unspecified superficial injury of left lower leg, subsequent encounter: Secondary | ICD-10-CM | POA: Diagnosis not present

## 2017-02-13 DIAGNOSIS — L89620 Pressure ulcer of left heel, unstageable: Secondary | ICD-10-CM | POA: Diagnosis not present

## 2017-02-14 DIAGNOSIS — L89620 Pressure ulcer of left heel, unstageable: Secondary | ICD-10-CM | POA: Diagnosis not present

## 2017-02-14 DIAGNOSIS — S80922D Unspecified superficial injury of left lower leg, subsequent encounter: Secondary | ICD-10-CM | POA: Diagnosis not present

## 2017-02-17 DIAGNOSIS — L89892 Pressure ulcer of other site, stage 2: Secondary | ICD-10-CM | POA: Diagnosis not present

## 2017-02-17 DIAGNOSIS — L89629 Pressure ulcer of left heel, unspecified stage: Secondary | ICD-10-CM | POA: Diagnosis not present

## 2017-02-17 DIAGNOSIS — S80922D Unspecified superficial injury of left lower leg, subsequent encounter: Secondary | ICD-10-CM | POA: Diagnosis not present

## 2017-02-17 DIAGNOSIS — L89612 Pressure ulcer of right heel, stage 2: Secondary | ICD-10-CM | POA: Diagnosis not present

## 2017-02-17 DIAGNOSIS — L89620 Pressure ulcer of left heel, unstageable: Secondary | ICD-10-CM | POA: Diagnosis not present

## 2017-02-18 DIAGNOSIS — S80922D Unspecified superficial injury of left lower leg, subsequent encounter: Secondary | ICD-10-CM | POA: Diagnosis not present

## 2017-02-18 DIAGNOSIS — L89620 Pressure ulcer of left heel, unstageable: Secondary | ICD-10-CM | POA: Diagnosis not present

## 2017-02-19 DIAGNOSIS — S80922D Unspecified superficial injury of left lower leg, subsequent encounter: Secondary | ICD-10-CM | POA: Diagnosis not present

## 2017-02-19 DIAGNOSIS — L89620 Pressure ulcer of left heel, unstageable: Secondary | ICD-10-CM | POA: Diagnosis not present

## 2017-02-21 DIAGNOSIS — N390 Urinary tract infection, site not specified: Secondary | ICD-10-CM | POA: Diagnosis not present

## 2017-02-21 DIAGNOSIS — L89620 Pressure ulcer of left heel, unstageable: Secondary | ICD-10-CM | POA: Diagnosis not present

## 2017-02-21 DIAGNOSIS — S80922D Unspecified superficial injury of left lower leg, subsequent encounter: Secondary | ICD-10-CM | POA: Diagnosis not present

## 2017-02-21 DIAGNOSIS — N401 Enlarged prostate with lower urinary tract symptoms: Secondary | ICD-10-CM | POA: Diagnosis not present

## 2017-02-24 DIAGNOSIS — L89612 Pressure ulcer of right heel, stage 2: Secondary | ICD-10-CM | POA: Diagnosis not present

## 2017-02-24 DIAGNOSIS — L89622 Pressure ulcer of left heel, stage 2: Secondary | ICD-10-CM | POA: Diagnosis not present

## 2017-02-24 DIAGNOSIS — L89620 Pressure ulcer of left heel, unstageable: Secondary | ICD-10-CM | POA: Diagnosis not present

## 2017-02-24 DIAGNOSIS — L89892 Pressure ulcer of other site, stage 2: Secondary | ICD-10-CM | POA: Diagnosis not present

## 2017-02-25 DIAGNOSIS — S80922D Unspecified superficial injury of left lower leg, subsequent encounter: Secondary | ICD-10-CM | POA: Diagnosis not present

## 2017-02-25 DIAGNOSIS — L89620 Pressure ulcer of left heel, unstageable: Secondary | ICD-10-CM | POA: Diagnosis not present

## 2017-02-26 DIAGNOSIS — L89620 Pressure ulcer of left heel, unstageable: Secondary | ICD-10-CM | POA: Diagnosis not present

## 2017-02-26 DIAGNOSIS — N401 Enlarged prostate with lower urinary tract symptoms: Secondary | ICD-10-CM | POA: Diagnosis not present

## 2017-02-26 DIAGNOSIS — N390 Urinary tract infection, site not specified: Secondary | ICD-10-CM | POA: Diagnosis not present

## 2017-02-26 DIAGNOSIS — R21 Rash and other nonspecific skin eruption: Secondary | ICD-10-CM | POA: Diagnosis not present

## 2017-02-26 DIAGNOSIS — S80922D Unspecified superficial injury of left lower leg, subsequent encounter: Secondary | ICD-10-CM | POA: Diagnosis not present

## 2017-02-26 DIAGNOSIS — R339 Retention of urine, unspecified: Secondary | ICD-10-CM | POA: Diagnosis not present

## 2017-02-27 DIAGNOSIS — L89620 Pressure ulcer of left heel, unstageable: Secondary | ICD-10-CM | POA: Diagnosis not present

## 2017-02-27 DIAGNOSIS — S80922D Unspecified superficial injury of left lower leg, subsequent encounter: Secondary | ICD-10-CM | POA: Diagnosis not present

## 2017-02-28 DIAGNOSIS — S80922D Unspecified superficial injury of left lower leg, subsequent encounter: Secondary | ICD-10-CM | POA: Diagnosis not present

## 2017-02-28 DIAGNOSIS — L89619 Pressure ulcer of right heel, unspecified stage: Secondary | ICD-10-CM | POA: Diagnosis not present

## 2017-02-28 DIAGNOSIS — L97519 Non-pressure chronic ulcer of other part of right foot with unspecified severity: Secondary | ICD-10-CM | POA: Diagnosis not present

## 2017-02-28 DIAGNOSIS — L89620 Pressure ulcer of left heel, unstageable: Secondary | ICD-10-CM | POA: Diagnosis not present

## 2017-02-28 DIAGNOSIS — L89629 Pressure ulcer of left heel, unspecified stage: Secondary | ICD-10-CM | POA: Diagnosis not present

## 2017-03-03 DIAGNOSIS — L89892 Pressure ulcer of other site, stage 2: Secondary | ICD-10-CM | POA: Diagnosis not present

## 2017-03-03 DIAGNOSIS — L89612 Pressure ulcer of right heel, stage 2: Secondary | ICD-10-CM | POA: Diagnosis not present

## 2017-03-03 DIAGNOSIS — L89624 Pressure ulcer of left heel, stage 4: Secondary | ICD-10-CM | POA: Diagnosis not present

## 2017-03-04 DIAGNOSIS — R609 Edema, unspecified: Secondary | ICD-10-CM | POA: Diagnosis not present

## 2017-03-05 DIAGNOSIS — L89620 Pressure ulcer of left heel, unstageable: Secondary | ICD-10-CM | POA: Diagnosis not present

## 2017-03-05 DIAGNOSIS — N401 Enlarged prostate with lower urinary tract symptoms: Secondary | ICD-10-CM | POA: Diagnosis not present

## 2017-03-05 DIAGNOSIS — S80922D Unspecified superficial injury of left lower leg, subsequent encounter: Secondary | ICD-10-CM | POA: Diagnosis not present

## 2017-03-05 DIAGNOSIS — R339 Retention of urine, unspecified: Secondary | ICD-10-CM | POA: Diagnosis not present

## 2017-03-07 DIAGNOSIS — L89620 Pressure ulcer of left heel, unstageable: Secondary | ICD-10-CM | POA: Diagnosis not present

## 2017-03-07 DIAGNOSIS — S80922D Unspecified superficial injury of left lower leg, subsequent encounter: Secondary | ICD-10-CM | POA: Diagnosis not present

## 2017-03-10 DIAGNOSIS — S80922D Unspecified superficial injury of left lower leg, subsequent encounter: Secondary | ICD-10-CM | POA: Diagnosis not present

## 2017-03-10 DIAGNOSIS — L89620 Pressure ulcer of left heel, unstageable: Secondary | ICD-10-CM | POA: Diagnosis not present

## 2017-03-11 DIAGNOSIS — M1712 Unilateral primary osteoarthritis, left knee: Secondary | ICD-10-CM | POA: Diagnosis not present

## 2017-03-12 DIAGNOSIS — R609 Edema, unspecified: Secondary | ICD-10-CM | POA: Diagnosis not present

## 2017-03-13 DIAGNOSIS — L89624 Pressure ulcer of left heel, stage 4: Secondary | ICD-10-CM | POA: Diagnosis not present

## 2017-03-13 DIAGNOSIS — L89892 Pressure ulcer of other site, stage 2: Secondary | ICD-10-CM | POA: Diagnosis not present

## 2017-03-15 DIAGNOSIS — S80922D Unspecified superficial injury of left lower leg, subsequent encounter: Secondary | ICD-10-CM | POA: Diagnosis not present

## 2017-03-15 DIAGNOSIS — L89620 Pressure ulcer of left heel, unstageable: Secondary | ICD-10-CM | POA: Diagnosis not present

## 2017-03-17 DIAGNOSIS — L89892 Pressure ulcer of other site, stage 2: Secondary | ICD-10-CM | POA: Diagnosis not present

## 2017-03-17 DIAGNOSIS — S80922D Unspecified superficial injury of left lower leg, subsequent encounter: Secondary | ICD-10-CM | POA: Diagnosis not present

## 2017-03-17 DIAGNOSIS — L89620 Pressure ulcer of left heel, unstageable: Secondary | ICD-10-CM | POA: Diagnosis not present

## 2017-03-17 DIAGNOSIS — L89624 Pressure ulcer of left heel, stage 4: Secondary | ICD-10-CM | POA: Diagnosis not present

## 2017-03-17 DIAGNOSIS — I130 Hypertensive heart and chronic kidney disease with heart failure and stage 1 through stage 4 chronic kidney disease, or unspecified chronic kidney disease: Secondary | ICD-10-CM | POA: Diagnosis not present

## 2017-03-17 DIAGNOSIS — I5020 Unspecified systolic (congestive) heart failure: Secondary | ICD-10-CM | POA: Diagnosis not present

## 2017-03-19 DIAGNOSIS — S80922D Unspecified superficial injury of left lower leg, subsequent encounter: Secondary | ICD-10-CM | POA: Diagnosis not present

## 2017-03-19 DIAGNOSIS — R339 Retention of urine, unspecified: Secondary | ICD-10-CM | POA: Diagnosis not present

## 2017-03-19 DIAGNOSIS — N401 Enlarged prostate with lower urinary tract symptoms: Secondary | ICD-10-CM | POA: Diagnosis not present

## 2017-03-19 DIAGNOSIS — L89620 Pressure ulcer of left heel, unstageable: Secondary | ICD-10-CM | POA: Diagnosis not present

## 2017-03-21 DIAGNOSIS — M1712 Unilateral primary osteoarthritis, left knee: Secondary | ICD-10-CM | POA: Diagnosis not present

## 2017-03-21 DIAGNOSIS — M1711 Unilateral primary osteoarthritis, right knee: Secondary | ICD-10-CM | POA: Diagnosis not present

## 2017-03-21 DIAGNOSIS — M25561 Pain in right knee: Secondary | ICD-10-CM | POA: Diagnosis not present

## 2017-03-21 DIAGNOSIS — L89620 Pressure ulcer of left heel, unstageable: Secondary | ICD-10-CM | POA: Diagnosis not present

## 2017-03-21 DIAGNOSIS — S80922D Unspecified superficial injury of left lower leg, subsequent encounter: Secondary | ICD-10-CM | POA: Diagnosis not present

## 2017-03-24 DIAGNOSIS — L89892 Pressure ulcer of other site, stage 2: Secondary | ICD-10-CM | POA: Diagnosis not present

## 2017-03-24 DIAGNOSIS — L89624 Pressure ulcer of left heel, stage 4: Secondary | ICD-10-CM | POA: Diagnosis not present

## 2017-03-26 DIAGNOSIS — S80922D Unspecified superficial injury of left lower leg, subsequent encounter: Secondary | ICD-10-CM | POA: Diagnosis not present

## 2017-03-26 DIAGNOSIS — L89620 Pressure ulcer of left heel, unstageable: Secondary | ICD-10-CM | POA: Diagnosis not present

## 2017-03-28 DIAGNOSIS — L89620 Pressure ulcer of left heel, unstageable: Secondary | ICD-10-CM | POA: Diagnosis not present

## 2017-03-28 DIAGNOSIS — S80922D Unspecified superficial injury of left lower leg, subsequent encounter: Secondary | ICD-10-CM | POA: Diagnosis not present

## 2017-03-31 DIAGNOSIS — S80922D Unspecified superficial injury of left lower leg, subsequent encounter: Secondary | ICD-10-CM | POA: Diagnosis not present

## 2017-03-31 DIAGNOSIS — L89620 Pressure ulcer of left heel, unstageable: Secondary | ICD-10-CM | POA: Diagnosis not present

## 2017-04-02 DIAGNOSIS — L89620 Pressure ulcer of left heel, unstageable: Secondary | ICD-10-CM | POA: Diagnosis not present

## 2017-04-02 DIAGNOSIS — S80922D Unspecified superficial injury of left lower leg, subsequent encounter: Secondary | ICD-10-CM | POA: Diagnosis not present

## 2017-04-04 DIAGNOSIS — L89620 Pressure ulcer of left heel, unstageable: Secondary | ICD-10-CM | POA: Diagnosis not present

## 2017-04-04 DIAGNOSIS — S80922D Unspecified superficial injury of left lower leg, subsequent encounter: Secondary | ICD-10-CM | POA: Diagnosis not present

## 2017-04-04 DIAGNOSIS — I872 Venous insufficiency (chronic) (peripheral): Secondary | ICD-10-CM | POA: Diagnosis not present

## 2017-04-07 DIAGNOSIS — L89893 Pressure ulcer of other site, stage 3: Secondary | ICD-10-CM | POA: Diagnosis not present

## 2017-04-07 DIAGNOSIS — L89624 Pressure ulcer of left heel, stage 4: Secondary | ICD-10-CM | POA: Diagnosis not present

## 2017-04-09 DIAGNOSIS — L89620 Pressure ulcer of left heel, unstageable: Secondary | ICD-10-CM | POA: Diagnosis not present

## 2017-04-09 DIAGNOSIS — S80922D Unspecified superficial injury of left lower leg, subsequent encounter: Secondary | ICD-10-CM | POA: Diagnosis not present

## 2017-04-09 DIAGNOSIS — N401 Enlarged prostate with lower urinary tract symptoms: Secondary | ICD-10-CM | POA: Diagnosis not present

## 2017-04-09 DIAGNOSIS — R339 Retention of urine, unspecified: Secondary | ICD-10-CM | POA: Diagnosis not present

## 2017-04-11 DIAGNOSIS — S80922D Unspecified superficial injury of left lower leg, subsequent encounter: Secondary | ICD-10-CM | POA: Diagnosis not present

## 2017-04-11 DIAGNOSIS — L89620 Pressure ulcer of left heel, unstageable: Secondary | ICD-10-CM | POA: Diagnosis not present

## 2017-04-14 DIAGNOSIS — L89892 Pressure ulcer of other site, stage 2: Secondary | ICD-10-CM | POA: Diagnosis not present

## 2017-04-14 DIAGNOSIS — L89624 Pressure ulcer of left heel, stage 4: Secondary | ICD-10-CM | POA: Diagnosis not present

## 2017-04-14 DIAGNOSIS — S80812A Abrasion, left lower leg, initial encounter: Secondary | ICD-10-CM | POA: Diagnosis not present

## 2017-04-16 DIAGNOSIS — S80922D Unspecified superficial injury of left lower leg, subsequent encounter: Secondary | ICD-10-CM | POA: Diagnosis not present

## 2017-04-16 DIAGNOSIS — L89620 Pressure ulcer of left heel, unstageable: Secondary | ICD-10-CM | POA: Diagnosis not present

## 2017-04-18 DIAGNOSIS — S80922D Unspecified superficial injury of left lower leg, subsequent encounter: Secondary | ICD-10-CM | POA: Diagnosis not present

## 2017-04-18 DIAGNOSIS — L89620 Pressure ulcer of left heel, unstageable: Secondary | ICD-10-CM | POA: Diagnosis not present

## 2017-04-21 DIAGNOSIS — L89624 Pressure ulcer of left heel, stage 4: Secondary | ICD-10-CM | POA: Diagnosis not present

## 2017-04-23 DIAGNOSIS — S80922D Unspecified superficial injury of left lower leg, subsequent encounter: Secondary | ICD-10-CM | POA: Diagnosis not present

## 2017-04-23 DIAGNOSIS — L89620 Pressure ulcer of left heel, unstageable: Secondary | ICD-10-CM | POA: Diagnosis not present

## 2017-04-25 DIAGNOSIS — S80922D Unspecified superficial injury of left lower leg, subsequent encounter: Secondary | ICD-10-CM | POA: Diagnosis not present

## 2017-04-25 DIAGNOSIS — M1712 Unilateral primary osteoarthritis, left knee: Secondary | ICD-10-CM | POA: Diagnosis not present

## 2017-04-25 DIAGNOSIS — L89620 Pressure ulcer of left heel, unstageable: Secondary | ICD-10-CM | POA: Diagnosis not present

## 2017-04-28 DIAGNOSIS — L89624 Pressure ulcer of left heel, stage 4: Secondary | ICD-10-CM | POA: Diagnosis not present

## 2017-04-30 DIAGNOSIS — L89620 Pressure ulcer of left heel, unstageable: Secondary | ICD-10-CM | POA: Diagnosis not present

## 2017-04-30 DIAGNOSIS — S80922D Unspecified superficial injury of left lower leg, subsequent encounter: Secondary | ICD-10-CM | POA: Diagnosis not present

## 2017-05-02 DIAGNOSIS — S80922D Unspecified superficial injury of left lower leg, subsequent encounter: Secondary | ICD-10-CM | POA: Diagnosis not present

## 2017-05-02 DIAGNOSIS — L89620 Pressure ulcer of left heel, unstageable: Secondary | ICD-10-CM | POA: Diagnosis not present

## 2017-05-05 DIAGNOSIS — L89624 Pressure ulcer of left heel, stage 4: Secondary | ICD-10-CM | POA: Diagnosis not present

## 2017-05-07 DIAGNOSIS — L89620 Pressure ulcer of left heel, unstageable: Secondary | ICD-10-CM | POA: Diagnosis not present

## 2017-05-07 DIAGNOSIS — N401 Enlarged prostate with lower urinary tract symptoms: Secondary | ICD-10-CM | POA: Diagnosis not present

## 2017-05-07 DIAGNOSIS — R339 Retention of urine, unspecified: Secondary | ICD-10-CM | POA: Diagnosis not present

## 2017-05-07 DIAGNOSIS — S80922D Unspecified superficial injury of left lower leg, subsequent encounter: Secondary | ICD-10-CM | POA: Diagnosis not present

## 2017-05-09 DIAGNOSIS — L89620 Pressure ulcer of left heel, unstageable: Secondary | ICD-10-CM | POA: Diagnosis not present

## 2017-05-09 DIAGNOSIS — S80922D Unspecified superficial injury of left lower leg, subsequent encounter: Secondary | ICD-10-CM | POA: Diagnosis not present

## 2017-05-12 DIAGNOSIS — L89624 Pressure ulcer of left heel, stage 4: Secondary | ICD-10-CM | POA: Diagnosis not present

## 2017-05-14 DIAGNOSIS — S80922D Unspecified superficial injury of left lower leg, subsequent encounter: Secondary | ICD-10-CM | POA: Diagnosis not present

## 2017-05-14 DIAGNOSIS — L89620 Pressure ulcer of left heel, unstageable: Secondary | ICD-10-CM | POA: Diagnosis not present

## 2017-05-15 DIAGNOSIS — L89620 Pressure ulcer of left heel, unstageable: Secondary | ICD-10-CM | POA: Diagnosis not present

## 2017-05-15 DIAGNOSIS — S80922D Unspecified superficial injury of left lower leg, subsequent encounter: Secondary | ICD-10-CM | POA: Diagnosis not present

## 2017-05-16 DIAGNOSIS — N183 Chronic kidney disease, stage 3 (moderate): Secondary | ICD-10-CM | POA: Diagnosis not present

## 2017-05-16 DIAGNOSIS — L89624 Pressure ulcer of left heel, stage 4: Secondary | ICD-10-CM | POA: Diagnosis not present

## 2017-05-16 DIAGNOSIS — I130 Hypertensive heart and chronic kidney disease with heart failure and stage 1 through stage 4 chronic kidney disease, or unspecified chronic kidney disease: Secondary | ICD-10-CM | POA: Diagnosis not present

## 2017-05-16 DIAGNOSIS — I5020 Unspecified systolic (congestive) heart failure: Secondary | ICD-10-CM | POA: Diagnosis not present

## 2017-05-19 DIAGNOSIS — L89624 Pressure ulcer of left heel, stage 4: Secondary | ICD-10-CM | POA: Diagnosis not present

## 2017-05-22 DIAGNOSIS — I130 Hypertensive heart and chronic kidney disease with heart failure and stage 1 through stage 4 chronic kidney disease, or unspecified chronic kidney disease: Secondary | ICD-10-CM | POA: Diagnosis not present

## 2017-05-22 DIAGNOSIS — L89624 Pressure ulcer of left heel, stage 4: Secondary | ICD-10-CM | POA: Diagnosis not present

## 2017-05-26 DIAGNOSIS — L89624 Pressure ulcer of left heel, stage 4: Secondary | ICD-10-CM | POA: Diagnosis not present

## 2017-05-29 DIAGNOSIS — I130 Hypertensive heart and chronic kidney disease with heart failure and stage 1 through stage 4 chronic kidney disease, or unspecified chronic kidney disease: Secondary | ICD-10-CM | POA: Diagnosis not present

## 2017-05-29 DIAGNOSIS — L89624 Pressure ulcer of left heel, stage 4: Secondary | ICD-10-CM | POA: Diagnosis not present

## 2017-06-02 DIAGNOSIS — L89624 Pressure ulcer of left heel, stage 4: Secondary | ICD-10-CM | POA: Diagnosis not present

## 2017-06-04 DIAGNOSIS — N401 Enlarged prostate with lower urinary tract symptoms: Secondary | ICD-10-CM | POA: Diagnosis not present

## 2017-06-04 DIAGNOSIS — R339 Retention of urine, unspecified: Secondary | ICD-10-CM | POA: Diagnosis not present

## 2017-06-05 DIAGNOSIS — I130 Hypertensive heart and chronic kidney disease with heart failure and stage 1 through stage 4 chronic kidney disease, or unspecified chronic kidney disease: Secondary | ICD-10-CM | POA: Diagnosis not present

## 2017-06-05 DIAGNOSIS — L89624 Pressure ulcer of left heel, stage 4: Secondary | ICD-10-CM | POA: Diagnosis not present

## 2017-06-09 DIAGNOSIS — L89624 Pressure ulcer of left heel, stage 4: Secondary | ICD-10-CM | POA: Diagnosis not present

## 2017-06-11 DIAGNOSIS — N401 Enlarged prostate with lower urinary tract symptoms: Secondary | ICD-10-CM | POA: Diagnosis not present

## 2017-06-11 DIAGNOSIS — R339 Retention of urine, unspecified: Secondary | ICD-10-CM | POA: Diagnosis not present

## 2017-06-12 DIAGNOSIS — L89624 Pressure ulcer of left heel, stage 4: Secondary | ICD-10-CM | POA: Diagnosis not present

## 2017-06-12 DIAGNOSIS — I130 Hypertensive heart and chronic kidney disease with heart failure and stage 1 through stage 4 chronic kidney disease, or unspecified chronic kidney disease: Secondary | ICD-10-CM | POA: Diagnosis not present

## 2017-06-16 DIAGNOSIS — Z09 Encounter for follow-up examination after completed treatment for conditions other than malignant neoplasm: Secondary | ICD-10-CM | POA: Diagnosis not present

## 2017-06-16 DIAGNOSIS — L89629 Pressure ulcer of left heel, unspecified stage: Secondary | ICD-10-CM | POA: Diagnosis not present

## 2017-06-16 DIAGNOSIS — Z872 Personal history of diseases of the skin and subcutaneous tissue: Secondary | ICD-10-CM | POA: Diagnosis not present

## 2017-06-19 DIAGNOSIS — I130 Hypertensive heart and chronic kidney disease with heart failure and stage 1 through stage 4 chronic kidney disease, or unspecified chronic kidney disease: Secondary | ICD-10-CM | POA: Diagnosis not present

## 2017-06-19 DIAGNOSIS — I872 Venous insufficiency (chronic) (peripheral): Secondary | ICD-10-CM | POA: Diagnosis not present

## 2017-06-19 DIAGNOSIS — L89624 Pressure ulcer of left heel, stage 4: Secondary | ICD-10-CM | POA: Diagnosis not present

## 2017-06-19 DIAGNOSIS — I10 Essential (primary) hypertension: Secondary | ICD-10-CM | POA: Diagnosis not present

## 2017-06-24 DIAGNOSIS — I130 Hypertensive heart and chronic kidney disease with heart failure and stage 1 through stage 4 chronic kidney disease, or unspecified chronic kidney disease: Secondary | ICD-10-CM | POA: Diagnosis not present

## 2017-06-24 DIAGNOSIS — L89624 Pressure ulcer of left heel, stage 4: Secondary | ICD-10-CM | POA: Diagnosis not present

## 2017-06-26 DIAGNOSIS — L89624 Pressure ulcer of left heel, stage 4: Secondary | ICD-10-CM | POA: Diagnosis not present

## 2017-06-26 DIAGNOSIS — I130 Hypertensive heart and chronic kidney disease with heart failure and stage 1 through stage 4 chronic kidney disease, or unspecified chronic kidney disease: Secondary | ICD-10-CM | POA: Diagnosis not present

## 2017-07-01 DIAGNOSIS — I130 Hypertensive heart and chronic kidney disease with heart failure and stage 1 through stage 4 chronic kidney disease, or unspecified chronic kidney disease: Secondary | ICD-10-CM | POA: Diagnosis not present

## 2017-07-01 DIAGNOSIS — L89624 Pressure ulcer of left heel, stage 4: Secondary | ICD-10-CM | POA: Diagnosis not present

## 2017-07-03 DIAGNOSIS — I130 Hypertensive heart and chronic kidney disease with heart failure and stage 1 through stage 4 chronic kidney disease, or unspecified chronic kidney disease: Secondary | ICD-10-CM | POA: Diagnosis not present

## 2017-07-03 DIAGNOSIS — L89624 Pressure ulcer of left heel, stage 4: Secondary | ICD-10-CM | POA: Diagnosis not present

## 2017-07-04 DIAGNOSIS — I872 Venous insufficiency (chronic) (peripheral): Secondary | ICD-10-CM | POA: Diagnosis not present

## 2017-07-08 DIAGNOSIS — L89624 Pressure ulcer of left heel, stage 4: Secondary | ICD-10-CM | POA: Diagnosis not present

## 2017-07-08 DIAGNOSIS — I130 Hypertensive heart and chronic kidney disease with heart failure and stage 1 through stage 4 chronic kidney disease, or unspecified chronic kidney disease: Secondary | ICD-10-CM | POA: Diagnosis not present

## 2017-07-10 DIAGNOSIS — N401 Enlarged prostate with lower urinary tract symptoms: Secondary | ICD-10-CM | POA: Diagnosis not present

## 2017-07-10 DIAGNOSIS — R339 Retention of urine, unspecified: Secondary | ICD-10-CM | POA: Diagnosis not present

## 2017-09-16 DIAGNOSIS — Z125 Encounter for screening for malignant neoplasm of prostate: Secondary | ICD-10-CM | POA: Diagnosis not present

## 2017-09-16 DIAGNOSIS — N401 Enlarged prostate with lower urinary tract symptoms: Secondary | ICD-10-CM | POA: Diagnosis not present

## 2017-10-06 DIAGNOSIS — I83009 Varicose veins of unspecified lower extremity with ulcer of unspecified site: Secondary | ICD-10-CM | POA: Diagnosis not present

## 2017-10-06 DIAGNOSIS — L97909 Non-pressure chronic ulcer of unspecified part of unspecified lower leg with unspecified severity: Secondary | ICD-10-CM | POA: Diagnosis not present

## 2017-10-09 DIAGNOSIS — Z905 Acquired absence of kidney: Secondary | ICD-10-CM | POA: Diagnosis not present

## 2017-10-09 DIAGNOSIS — M109 Gout, unspecified: Secondary | ICD-10-CM | POA: Diagnosis not present

## 2017-10-09 DIAGNOSIS — G4730 Sleep apnea, unspecified: Secondary | ICD-10-CM | POA: Diagnosis not present

## 2017-10-09 DIAGNOSIS — L89523 Pressure ulcer of left ankle, stage 3: Secondary | ICD-10-CM | POA: Diagnosis not present

## 2017-10-09 DIAGNOSIS — I5030 Unspecified diastolic (congestive) heart failure: Secondary | ICD-10-CM | POA: Diagnosis not present

## 2017-10-09 DIAGNOSIS — Z86718 Personal history of other venous thrombosis and embolism: Secondary | ICD-10-CM | POA: Diagnosis not present

## 2017-10-09 DIAGNOSIS — Z86711 Personal history of pulmonary embolism: Secondary | ICD-10-CM | POA: Diagnosis not present

## 2017-10-09 DIAGNOSIS — I110 Hypertensive heart disease with heart failure: Secondary | ICD-10-CM | POA: Diagnosis not present

## 2017-10-09 DIAGNOSIS — I87312 Chronic venous hypertension (idiopathic) with ulcer of left lower extremity: Secondary | ICD-10-CM | POA: Diagnosis not present

## 2017-10-09 DIAGNOSIS — J449 Chronic obstructive pulmonary disease, unspecified: Secondary | ICD-10-CM | POA: Diagnosis not present

## 2017-10-09 DIAGNOSIS — L97322 Non-pressure chronic ulcer of left ankle with fat layer exposed: Secondary | ICD-10-CM | POA: Diagnosis not present

## 2017-10-09 DIAGNOSIS — Z87891 Personal history of nicotine dependence: Secondary | ICD-10-CM | POA: Diagnosis not present

## 2017-10-09 DIAGNOSIS — Z85528 Personal history of other malignant neoplasm of kidney: Secondary | ICD-10-CM | POA: Diagnosis not present

## 2017-10-09 DIAGNOSIS — I872 Venous insufficiency (chronic) (peripheral): Secondary | ICD-10-CM | POA: Diagnosis not present

## 2017-10-09 DIAGNOSIS — I739 Peripheral vascular disease, unspecified: Secondary | ICD-10-CM | POA: Diagnosis not present

## 2017-10-09 DIAGNOSIS — M1990 Unspecified osteoarthritis, unspecified site: Secondary | ICD-10-CM | POA: Diagnosis not present

## 2017-10-16 DIAGNOSIS — I87312 Chronic venous hypertension (idiopathic) with ulcer of left lower extremity: Secondary | ICD-10-CM | POA: Diagnosis not present

## 2017-10-16 DIAGNOSIS — I5030 Unspecified diastolic (congestive) heart failure: Secondary | ICD-10-CM | POA: Diagnosis not present

## 2017-10-16 DIAGNOSIS — L97522 Non-pressure chronic ulcer of other part of left foot with fat layer exposed: Secondary | ICD-10-CM | POA: Diagnosis not present

## 2017-10-16 DIAGNOSIS — L97322 Non-pressure chronic ulcer of left ankle with fat layer exposed: Secondary | ICD-10-CM | POA: Diagnosis not present

## 2017-10-18 DIAGNOSIS — I87312 Chronic venous hypertension (idiopathic) with ulcer of left lower extremity: Secondary | ICD-10-CM | POA: Diagnosis not present

## 2017-10-18 DIAGNOSIS — L97822 Non-pressure chronic ulcer of other part of left lower leg with fat layer exposed: Secondary | ICD-10-CM | POA: Diagnosis not present

## 2017-10-18 DIAGNOSIS — I5030 Unspecified diastolic (congestive) heart failure: Secondary | ICD-10-CM | POA: Diagnosis not present

## 2017-10-18 DIAGNOSIS — I130 Hypertensive heart and chronic kidney disease with heart failure and stage 1 through stage 4 chronic kidney disease, or unspecified chronic kidney disease: Secondary | ICD-10-CM | POA: Diagnosis not present

## 2017-10-20 DIAGNOSIS — I87312 Chronic venous hypertension (idiopathic) with ulcer of left lower extremity: Secondary | ICD-10-CM | POA: Diagnosis not present

## 2017-10-20 DIAGNOSIS — L97822 Non-pressure chronic ulcer of other part of left lower leg with fat layer exposed: Secondary | ICD-10-CM | POA: Diagnosis not present

## 2017-10-23 DIAGNOSIS — L97322 Non-pressure chronic ulcer of left ankle with fat layer exposed: Secondary | ICD-10-CM | POA: Diagnosis not present

## 2017-10-23 DIAGNOSIS — I87312 Chronic venous hypertension (idiopathic) with ulcer of left lower extremity: Secondary | ICD-10-CM | POA: Diagnosis not present

## 2017-10-23 DIAGNOSIS — I872 Venous insufficiency (chronic) (peripheral): Secondary | ICD-10-CM | POA: Diagnosis not present

## 2017-10-27 DIAGNOSIS — L97822 Non-pressure chronic ulcer of other part of left lower leg with fat layer exposed: Secondary | ICD-10-CM | POA: Diagnosis not present

## 2017-10-27 DIAGNOSIS — I87312 Chronic venous hypertension (idiopathic) with ulcer of left lower extremity: Secondary | ICD-10-CM | POA: Diagnosis not present

## 2017-10-30 DIAGNOSIS — L97822 Non-pressure chronic ulcer of other part of left lower leg with fat layer exposed: Secondary | ICD-10-CM | POA: Diagnosis not present

## 2017-10-30 DIAGNOSIS — I5030 Unspecified diastolic (congestive) heart failure: Secondary | ICD-10-CM | POA: Diagnosis not present

## 2017-10-30 DIAGNOSIS — I87312 Chronic venous hypertension (idiopathic) with ulcer of left lower extremity: Secondary | ICD-10-CM | POA: Diagnosis not present

## 2017-10-30 DIAGNOSIS — S81002A Unspecified open wound, left knee, initial encounter: Secondary | ICD-10-CM | POA: Diagnosis not present

## 2017-10-30 DIAGNOSIS — L97322 Non-pressure chronic ulcer of left ankle with fat layer exposed: Secondary | ICD-10-CM | POA: Diagnosis not present

## 2017-10-31 ENCOUNTER — Other Ambulatory Visit: Payer: Self-pay

## 2017-10-31 DIAGNOSIS — S81802A Unspecified open wound, left lower leg, initial encounter: Secondary | ICD-10-CM | POA: Diagnosis not present

## 2017-10-31 DIAGNOSIS — I87312 Chronic venous hypertension (idiopathic) with ulcer of left lower extremity: Secondary | ICD-10-CM | POA: Diagnosis not present

## 2017-11-03 DIAGNOSIS — L97822 Non-pressure chronic ulcer of other part of left lower leg with fat layer exposed: Secondary | ICD-10-CM | POA: Diagnosis not present

## 2017-11-03 DIAGNOSIS — I87312 Chronic venous hypertension (idiopathic) with ulcer of left lower extremity: Secondary | ICD-10-CM | POA: Diagnosis not present

## 2017-11-06 DIAGNOSIS — L97322 Non-pressure chronic ulcer of left ankle with fat layer exposed: Secondary | ICD-10-CM | POA: Diagnosis not present

## 2017-11-06 DIAGNOSIS — I5030 Unspecified diastolic (congestive) heart failure: Secondary | ICD-10-CM | POA: Diagnosis not present

## 2017-11-06 DIAGNOSIS — L97822 Non-pressure chronic ulcer of other part of left lower leg with fat layer exposed: Secondary | ICD-10-CM | POA: Diagnosis not present

## 2017-11-06 DIAGNOSIS — I87312 Chronic venous hypertension (idiopathic) with ulcer of left lower extremity: Secondary | ICD-10-CM | POA: Diagnosis not present

## 2017-11-10 DIAGNOSIS — L97822 Non-pressure chronic ulcer of other part of left lower leg with fat layer exposed: Secondary | ICD-10-CM | POA: Diagnosis not present

## 2017-11-10 DIAGNOSIS — I87312 Chronic venous hypertension (idiopathic) with ulcer of left lower extremity: Secondary | ICD-10-CM | POA: Diagnosis not present

## 2017-11-12 DIAGNOSIS — Z8679 Personal history of other diseases of the circulatory system: Secondary | ICD-10-CM | POA: Diagnosis not present

## 2017-11-12 DIAGNOSIS — L97829 Non-pressure chronic ulcer of other part of left lower leg with unspecified severity: Secondary | ICD-10-CM | POA: Diagnosis not present

## 2017-11-12 DIAGNOSIS — Z09 Encounter for follow-up examination after completed treatment for conditions other than malignant neoplasm: Secondary | ICD-10-CM | POA: Diagnosis not present

## 2017-11-12 DIAGNOSIS — Z872 Personal history of diseases of the skin and subcutaneous tissue: Secondary | ICD-10-CM | POA: Diagnosis not present

## 2017-11-12 DIAGNOSIS — L97329 Non-pressure chronic ulcer of left ankle with unspecified severity: Secondary | ICD-10-CM | POA: Diagnosis not present

## 2017-11-14 DIAGNOSIS — I82541 Chronic embolism and thrombosis of right tibial vein: Secondary | ICD-10-CM | POA: Diagnosis not present

## 2017-11-14 DIAGNOSIS — I82531 Chronic embolism and thrombosis of right popliteal vein: Secondary | ICD-10-CM | POA: Diagnosis not present

## 2017-11-14 DIAGNOSIS — R2241 Localized swelling, mass and lump, right lower limb: Secondary | ICD-10-CM | POA: Diagnosis not present

## 2017-11-14 DIAGNOSIS — L97822 Non-pressure chronic ulcer of other part of left lower leg with fat layer exposed: Secondary | ICD-10-CM | POA: Diagnosis not present

## 2017-11-14 DIAGNOSIS — R2242 Localized swelling, mass and lump, left lower limb: Secondary | ICD-10-CM | POA: Diagnosis not present

## 2017-11-14 DIAGNOSIS — I87312 Chronic venous hypertension (idiopathic) with ulcer of left lower extremity: Secondary | ICD-10-CM | POA: Diagnosis not present

## 2017-11-17 DIAGNOSIS — I87312 Chronic venous hypertension (idiopathic) with ulcer of left lower extremity: Secondary | ICD-10-CM | POA: Diagnosis not present

## 2017-11-17 DIAGNOSIS — L97822 Non-pressure chronic ulcer of other part of left lower leg with fat layer exposed: Secondary | ICD-10-CM | POA: Diagnosis not present

## 2017-12-22 DIAGNOSIS — R339 Retention of urine, unspecified: Secondary | ICD-10-CM | POA: Diagnosis not present

## 2017-12-22 DIAGNOSIS — N401 Enlarged prostate with lower urinary tract symptoms: Secondary | ICD-10-CM | POA: Diagnosis not present

## 2018-01-08 DIAGNOSIS — Z23 Encounter for immunization: Secondary | ICD-10-CM | POA: Diagnosis not present

## 2018-01-08 DIAGNOSIS — M150 Primary generalized (osteo)arthritis: Secondary | ICD-10-CM | POA: Diagnosis not present

## 2018-01-08 DIAGNOSIS — Z6835 Body mass index (BMI) 35.0-35.9, adult: Secondary | ICD-10-CM | POA: Diagnosis not present

## 2018-01-08 DIAGNOSIS — J449 Chronic obstructive pulmonary disease, unspecified: Secondary | ICD-10-CM | POA: Diagnosis not present

## 2018-01-08 DIAGNOSIS — Z86711 Personal history of pulmonary embolism: Secondary | ICD-10-CM | POA: Diagnosis not present

## 2018-01-08 DIAGNOSIS — I872 Venous insufficiency (chronic) (peripheral): Secondary | ICD-10-CM | POA: Diagnosis not present

## 2018-01-08 DIAGNOSIS — I10 Essential (primary) hypertension: Secondary | ICD-10-CM | POA: Diagnosis not present

## 2018-01-08 DIAGNOSIS — N183 Chronic kidney disease, stage 3 (moderate): Secondary | ICD-10-CM | POA: Diagnosis not present

## 2018-01-08 DIAGNOSIS — Z1389 Encounter for screening for other disorder: Secondary | ICD-10-CM | POA: Diagnosis not present

## 2018-01-08 DIAGNOSIS — E7800 Pure hypercholesterolemia, unspecified: Secondary | ICD-10-CM | POA: Diagnosis not present

## 2018-01-08 DIAGNOSIS — G4733 Obstructive sleep apnea (adult) (pediatric): Secondary | ICD-10-CM | POA: Diagnosis not present

## 2018-01-08 DIAGNOSIS — N400 Enlarged prostate without lower urinary tract symptoms: Secondary | ICD-10-CM | POA: Diagnosis not present

## 2018-02-18 ENCOUNTER — Other Ambulatory Visit: Payer: Self-pay

## 2018-04-10 DIAGNOSIS — J189 Pneumonia, unspecified organism: Secondary | ICD-10-CM | POA: Diagnosis not present

## 2018-04-10 DIAGNOSIS — R05 Cough: Secondary | ICD-10-CM | POA: Diagnosis not present

## 2018-04-10 DIAGNOSIS — J441 Chronic obstructive pulmonary disease with (acute) exacerbation: Secondary | ICD-10-CM | POA: Diagnosis not present

## 2018-04-10 DIAGNOSIS — R0602 Shortness of breath: Secondary | ICD-10-CM | POA: Diagnosis not present

## 2018-04-10 DIAGNOSIS — R0902 Hypoxemia: Secondary | ICD-10-CM | POA: Diagnosis not present

## 2018-04-11 DIAGNOSIS — J189 Pneumonia, unspecified organism: Secondary | ICD-10-CM | POA: Diagnosis not present

## 2018-04-11 DIAGNOSIS — J441 Chronic obstructive pulmonary disease with (acute) exacerbation: Secondary | ICD-10-CM | POA: Diagnosis not present

## 2018-04-12 DIAGNOSIS — J441 Chronic obstructive pulmonary disease with (acute) exacerbation: Secondary | ICD-10-CM | POA: Diagnosis not present

## 2018-04-12 DIAGNOSIS — J189 Pneumonia, unspecified organism: Secondary | ICD-10-CM | POA: Diagnosis not present

## 2018-04-13 DIAGNOSIS — J441 Chronic obstructive pulmonary disease with (acute) exacerbation: Secondary | ICD-10-CM | POA: Diagnosis not present

## 2018-04-13 DIAGNOSIS — J189 Pneumonia, unspecified organism: Secondary | ICD-10-CM | POA: Diagnosis not present

## 2018-04-14 DIAGNOSIS — J441 Chronic obstructive pulmonary disease with (acute) exacerbation: Secondary | ICD-10-CM | POA: Diagnosis not present

## 2018-04-14 DIAGNOSIS — I517 Cardiomegaly: Secondary | ICD-10-CM | POA: Diagnosis not present

## 2018-04-14 DIAGNOSIS — I361 Nonrheumatic tricuspid (valve) insufficiency: Secondary | ICD-10-CM | POA: Diagnosis not present

## 2018-04-14 DIAGNOSIS — J189 Pneumonia, unspecified organism: Secondary | ICD-10-CM | POA: Diagnosis not present

## 2018-04-15 DIAGNOSIS — R293 Abnormal posture: Secondary | ICD-10-CM | POA: Diagnosis not present

## 2018-04-15 DIAGNOSIS — D649 Anemia, unspecified: Secondary | ICD-10-CM | POA: Diagnosis not present

## 2018-04-15 DIAGNOSIS — N182 Chronic kidney disease, stage 2 (mild): Secondary | ICD-10-CM | POA: Diagnosis not present

## 2018-04-15 DIAGNOSIS — M2550 Pain in unspecified joint: Secondary | ICD-10-CM | POA: Diagnosis not present

## 2018-04-15 DIAGNOSIS — Z9981 Dependence on supplemental oxygen: Secondary | ICD-10-CM | POA: Diagnosis not present

## 2018-04-15 DIAGNOSIS — J9611 Chronic respiratory failure with hypoxia: Secondary | ICD-10-CM | POA: Diagnosis not present

## 2018-04-15 DIAGNOSIS — I5042 Chronic combined systolic (congestive) and diastolic (congestive) heart failure: Secondary | ICD-10-CM | POA: Diagnosis not present

## 2018-04-15 DIAGNOSIS — R2681 Unsteadiness on feet: Secondary | ICD-10-CM | POA: Diagnosis not present

## 2018-04-15 DIAGNOSIS — E785 Hyperlipidemia, unspecified: Secondary | ICD-10-CM | POA: Diagnosis not present

## 2018-04-15 DIAGNOSIS — J189 Pneumonia, unspecified organism: Secondary | ICD-10-CM | POA: Diagnosis not present

## 2018-04-15 DIAGNOSIS — R339 Retention of urine, unspecified: Secondary | ICD-10-CM | POA: Diagnosis not present

## 2018-04-15 DIAGNOSIS — R2689 Other abnormalities of gait and mobility: Secondary | ICD-10-CM | POA: Diagnosis not present

## 2018-04-15 DIAGNOSIS — Z741 Need for assistance with personal care: Secondary | ICD-10-CM | POA: Diagnosis not present

## 2018-04-15 DIAGNOSIS — J441 Chronic obstructive pulmonary disease with (acute) exacerbation: Secondary | ICD-10-CM | POA: Diagnosis not present

## 2018-04-15 DIAGNOSIS — J439 Emphysema, unspecified: Secondary | ICD-10-CM | POA: Diagnosis not present

## 2018-04-15 DIAGNOSIS — J449 Chronic obstructive pulmonary disease, unspecified: Secondary | ICD-10-CM | POA: Diagnosis not present

## 2018-04-15 DIAGNOSIS — R278 Other lack of coordination: Secondary | ICD-10-CM | POA: Diagnosis not present

## 2018-04-15 DIAGNOSIS — Z720 Tobacco use: Secondary | ICD-10-CM | POA: Diagnosis not present

## 2018-04-15 DIAGNOSIS — I110 Hypertensive heart disease with heart failure: Secondary | ICD-10-CM | POA: Diagnosis not present

## 2018-04-15 DIAGNOSIS — R5383 Other fatigue: Secondary | ICD-10-CM | POA: Diagnosis not present

## 2018-04-15 DIAGNOSIS — I10 Essential (primary) hypertension: Secondary | ICD-10-CM | POA: Diagnosis not present

## 2018-04-15 DIAGNOSIS — Z7401 Bed confinement status: Secondary | ICD-10-CM | POA: Diagnosis not present

## 2018-04-17 DIAGNOSIS — Z720 Tobacco use: Secondary | ICD-10-CM | POA: Diagnosis not present

## 2018-04-17 DIAGNOSIS — I110 Hypertensive heart disease with heart failure: Secondary | ICD-10-CM | POA: Diagnosis not present

## 2018-04-17 DIAGNOSIS — I5042 Chronic combined systolic (congestive) and diastolic (congestive) heart failure: Secondary | ICD-10-CM | POA: Diagnosis not present

## 2018-04-17 DIAGNOSIS — J9611 Chronic respiratory failure with hypoxia: Secondary | ICD-10-CM | POA: Diagnosis not present

## 2018-04-17 DIAGNOSIS — J449 Chronic obstructive pulmonary disease, unspecified: Secondary | ICD-10-CM | POA: Diagnosis not present

## 2018-04-24 DIAGNOSIS — J449 Chronic obstructive pulmonary disease, unspecified: Secondary | ICD-10-CM | POA: Diagnosis not present

## 2018-04-24 DIAGNOSIS — I110 Hypertensive heart disease with heart failure: Secondary | ICD-10-CM | POA: Diagnosis not present

## 2018-04-24 DIAGNOSIS — I5042 Chronic combined systolic (congestive) and diastolic (congestive) heart failure: Secondary | ICD-10-CM | POA: Diagnosis not present

## 2018-04-24 DIAGNOSIS — J9611 Chronic respiratory failure with hypoxia: Secondary | ICD-10-CM | POA: Diagnosis not present

## 2018-04-28 DIAGNOSIS — J189 Pneumonia, unspecified organism: Secondary | ICD-10-CM | POA: Diagnosis not present

## 2018-04-28 DIAGNOSIS — I130 Hypertensive heart and chronic kidney disease with heart failure and stage 1 through stage 4 chronic kidney disease, or unspecified chronic kidney disease: Secondary | ICD-10-CM | POA: Diagnosis not present

## 2018-04-28 DIAGNOSIS — J439 Emphysema, unspecified: Secondary | ICD-10-CM | POA: Diagnosis not present

## 2018-04-28 DIAGNOSIS — J9611 Chronic respiratory failure with hypoxia: Secondary | ICD-10-CM | POA: Diagnosis not present

## 2018-05-01 DIAGNOSIS — J449 Chronic obstructive pulmonary disease, unspecified: Secondary | ICD-10-CM | POA: Diagnosis not present

## 2018-05-06 DIAGNOSIS — J449 Chronic obstructive pulmonary disease, unspecified: Secondary | ICD-10-CM | POA: Diagnosis present

## 2018-05-06 DIAGNOSIS — R4020 Unspecified coma: Secondary | ICD-10-CM | POA: Diagnosis not present

## 2018-05-06 DIAGNOSIS — Z86711 Personal history of pulmonary embolism: Secondary | ICD-10-CM | POA: Diagnosis not present

## 2018-05-06 DIAGNOSIS — D509 Iron deficiency anemia, unspecified: Secondary | ICD-10-CM | POA: Diagnosis present

## 2018-05-06 DIAGNOSIS — M109 Gout, unspecified: Secondary | ICD-10-CM | POA: Diagnosis present

## 2018-05-06 DIAGNOSIS — I480 Paroxysmal atrial fibrillation: Secondary | ICD-10-CM | POA: Diagnosis present

## 2018-05-06 DIAGNOSIS — R000 Tachycardia, unspecified: Secondary | ICD-10-CM | POA: Diagnosis present

## 2018-05-06 DIAGNOSIS — N179 Acute kidney failure, unspecified: Secondary | ICD-10-CM | POA: Diagnosis present

## 2018-05-06 DIAGNOSIS — E785 Hyperlipidemia, unspecified: Secondary | ICD-10-CM | POA: Diagnosis present

## 2018-05-06 DIAGNOSIS — Z87891 Personal history of nicotine dependence: Secondary | ICD-10-CM | POA: Diagnosis not present

## 2018-05-06 DIAGNOSIS — Z6841 Body Mass Index (BMI) 40.0 and over, adult: Secondary | ICD-10-CM | POA: Diagnosis not present

## 2018-05-06 DIAGNOSIS — R42 Dizziness and giddiness: Secondary | ICD-10-CM | POA: Diagnosis not present

## 2018-05-06 DIAGNOSIS — K219 Gastro-esophageal reflux disease without esophagitis: Secondary | ICD-10-CM | POA: Diagnosis present

## 2018-05-06 DIAGNOSIS — Z905 Acquired absence of kidney: Secondary | ICD-10-CM | POA: Diagnosis not present

## 2018-05-06 DIAGNOSIS — Z85528 Personal history of other malignant neoplasm of kidney: Secondary | ICD-10-CM | POA: Diagnosis not present

## 2018-05-06 DIAGNOSIS — N281 Cyst of kidney, acquired: Secondary | ICD-10-CM | POA: Diagnosis not present

## 2018-05-06 DIAGNOSIS — I4892 Unspecified atrial flutter: Secondary | ICD-10-CM | POA: Diagnosis present

## 2018-05-06 DIAGNOSIS — G629 Polyneuropathy, unspecified: Secondary | ICD-10-CM | POA: Diagnosis present

## 2018-05-06 DIAGNOSIS — R55 Syncope and collapse: Secondary | ICD-10-CM | POA: Diagnosis not present

## 2018-05-06 DIAGNOSIS — I5032 Chronic diastolic (congestive) heart failure: Secondary | ICD-10-CM | POA: Diagnosis present

## 2018-05-06 DIAGNOSIS — N400 Enlarged prostate without lower urinary tract symptoms: Secondary | ICD-10-CM | POA: Diagnosis present

## 2018-05-06 DIAGNOSIS — I10 Essential (primary) hypertension: Secondary | ICD-10-CM | POA: Diagnosis present

## 2018-05-06 DIAGNOSIS — I4891 Unspecified atrial fibrillation: Secondary | ICD-10-CM | POA: Diagnosis not present

## 2018-05-06 DIAGNOSIS — Z86718 Personal history of other venous thrombosis and embolism: Secondary | ICD-10-CM | POA: Diagnosis not present

## 2018-05-06 DIAGNOSIS — Z7901 Long term (current) use of anticoagulants: Secondary | ICD-10-CM | POA: Diagnosis not present

## 2018-05-06 DIAGNOSIS — I428 Other cardiomyopathies: Secondary | ICD-10-CM | POA: Diagnosis present

## 2018-05-06 DIAGNOSIS — I110 Hypertensive heart disease with heart failure: Secondary | ICD-10-CM | POA: Diagnosis present

## 2018-05-06 DIAGNOSIS — Z9981 Dependence on supplemental oxygen: Secondary | ICD-10-CM | POA: Diagnosis not present

## 2018-05-08 DIAGNOSIS — I4891 Unspecified atrial fibrillation: Secondary | ICD-10-CM

## 2018-05-08 DIAGNOSIS — I480 Paroxysmal atrial fibrillation: Secondary | ICD-10-CM

## 2018-05-09 DIAGNOSIS — I5032 Chronic diastolic (congestive) heart failure: Secondary | ICD-10-CM

## 2018-05-09 DIAGNOSIS — J449 Chronic obstructive pulmonary disease, unspecified: Secondary | ICD-10-CM

## 2018-05-11 DIAGNOSIS — I4891 Unspecified atrial fibrillation: Secondary | ICD-10-CM

## 2018-05-12 DIAGNOSIS — I4892 Unspecified atrial flutter: Secondary | ICD-10-CM

## 2018-05-18 DIAGNOSIS — I4891 Unspecified atrial fibrillation: Secondary | ICD-10-CM | POA: Diagnosis not present

## 2018-05-27 DIAGNOSIS — N401 Enlarged prostate with lower urinary tract symptoms: Secondary | ICD-10-CM | POA: Diagnosis not present

## 2018-05-27 DIAGNOSIS — R339 Retention of urine, unspecified: Secondary | ICD-10-CM | POA: Diagnosis not present

## 2018-05-28 DIAGNOSIS — G4733 Obstructive sleep apnea (adult) (pediatric): Secondary | ICD-10-CM | POA: Diagnosis not present

## 2018-05-28 DIAGNOSIS — I5032 Chronic diastolic (congestive) heart failure: Secondary | ICD-10-CM | POA: Diagnosis not present

## 2018-05-28 DIAGNOSIS — N179 Acute kidney failure, unspecified: Secondary | ICD-10-CM | POA: Diagnosis not present

## 2018-05-28 DIAGNOSIS — R338 Other retention of urine: Secondary | ICD-10-CM | POA: Diagnosis not present

## 2018-05-28 DIAGNOSIS — D509 Iron deficiency anemia, unspecified: Secondary | ICD-10-CM | POA: Diagnosis not present

## 2018-05-28 DIAGNOSIS — E669 Obesity, unspecified: Secondary | ICD-10-CM | POA: Diagnosis not present

## 2018-05-28 DIAGNOSIS — J439 Emphysema, unspecified: Secondary | ICD-10-CM | POA: Diagnosis not present

## 2018-05-28 DIAGNOSIS — I4891 Unspecified atrial fibrillation: Secondary | ICD-10-CM | POA: Diagnosis not present

## 2018-05-28 DIAGNOSIS — I130 Hypertensive heart and chronic kidney disease with heart failure and stage 1 through stage 4 chronic kidney disease, or unspecified chronic kidney disease: Secondary | ICD-10-CM | POA: Diagnosis not present

## 2018-05-28 DIAGNOSIS — M1030 Gout due to renal impairment, unspecified site: Secondary | ICD-10-CM | POA: Diagnosis not present

## 2018-05-28 DIAGNOSIS — J9622 Acute and chronic respiratory failure with hypercapnia: Secondary | ICD-10-CM | POA: Diagnosis not present

## 2018-05-28 DIAGNOSIS — J9621 Acute and chronic respiratory failure with hypoxia: Secondary | ICD-10-CM | POA: Diagnosis not present

## 2018-05-28 DIAGNOSIS — N401 Enlarged prostate with lower urinary tract symptoms: Secondary | ICD-10-CM | POA: Diagnosis not present

## 2018-05-28 DIAGNOSIS — I87309 Chronic venous hypertension (idiopathic) without complications of unspecified lower extremity: Secondary | ICD-10-CM | POA: Diagnosis not present

## 2018-05-28 DIAGNOSIS — I739 Peripheral vascular disease, unspecified: Secondary | ICD-10-CM | POA: Diagnosis not present

## 2018-05-28 DIAGNOSIS — I272 Pulmonary hypertension, unspecified: Secondary | ICD-10-CM | POA: Diagnosis not present

## 2018-05-28 DIAGNOSIS — J189 Pneumonia, unspecified organism: Secondary | ICD-10-CM | POA: Diagnosis not present

## 2018-05-28 DIAGNOSIS — F419 Anxiety disorder, unspecified: Secondary | ICD-10-CM | POA: Diagnosis not present

## 2018-05-28 DIAGNOSIS — I2510 Atherosclerotic heart disease of native coronary artery without angina pectoris: Secondary | ICD-10-CM | POA: Diagnosis not present

## 2018-05-28 DIAGNOSIS — I5020 Unspecified systolic (congestive) heart failure: Secondary | ICD-10-CM | POA: Diagnosis not present

## 2018-05-28 DIAGNOSIS — N183 Chronic kidney disease, stage 3 (moderate): Secondary | ICD-10-CM | POA: Diagnosis not present

## 2018-05-28 DIAGNOSIS — M170 Bilateral primary osteoarthritis of knee: Secondary | ICD-10-CM | POA: Diagnosis not present

## 2018-05-28 DIAGNOSIS — G629 Polyneuropathy, unspecified: Secondary | ICD-10-CM | POA: Diagnosis not present

## 2018-05-28 DIAGNOSIS — R41841 Cognitive communication deficit: Secondary | ICD-10-CM | POA: Diagnosis not present

## 2018-05-28 DIAGNOSIS — D631 Anemia in chronic kidney disease: Secondary | ICD-10-CM | POA: Diagnosis not present

## 2018-06-02 DIAGNOSIS — J189 Pneumonia, unspecified organism: Secondary | ICD-10-CM | POA: Diagnosis not present

## 2018-06-02 DIAGNOSIS — J439 Emphysema, unspecified: Secondary | ICD-10-CM | POA: Diagnosis not present

## 2018-06-02 DIAGNOSIS — J9621 Acute and chronic respiratory failure with hypoxia: Secondary | ICD-10-CM | POA: Diagnosis not present

## 2018-06-02 DIAGNOSIS — J9622 Acute and chronic respiratory failure with hypercapnia: Secondary | ICD-10-CM | POA: Diagnosis not present

## 2018-06-02 DIAGNOSIS — I4891 Unspecified atrial fibrillation: Secondary | ICD-10-CM | POA: Diagnosis not present

## 2018-06-02 DIAGNOSIS — I130 Hypertensive heart and chronic kidney disease with heart failure and stage 1 through stage 4 chronic kidney disease, or unspecified chronic kidney disease: Secondary | ICD-10-CM | POA: Diagnosis not present

## 2018-06-03 DIAGNOSIS — J9622 Acute and chronic respiratory failure with hypercapnia: Secondary | ICD-10-CM | POA: Diagnosis not present

## 2018-06-03 DIAGNOSIS — I130 Hypertensive heart and chronic kidney disease with heart failure and stage 1 through stage 4 chronic kidney disease, or unspecified chronic kidney disease: Secondary | ICD-10-CM | POA: Diagnosis not present

## 2018-06-03 DIAGNOSIS — J439 Emphysema, unspecified: Secondary | ICD-10-CM | POA: Diagnosis not present

## 2018-06-03 DIAGNOSIS — J189 Pneumonia, unspecified organism: Secondary | ICD-10-CM | POA: Diagnosis not present

## 2018-06-03 DIAGNOSIS — J9621 Acute and chronic respiratory failure with hypoxia: Secondary | ICD-10-CM | POA: Diagnosis not present

## 2018-06-03 DIAGNOSIS — I4891 Unspecified atrial fibrillation: Secondary | ICD-10-CM | POA: Diagnosis not present

## 2018-06-04 ENCOUNTER — Other Ambulatory Visit: Payer: Self-pay

## 2018-06-09 ENCOUNTER — Encounter: Payer: Self-pay | Admitting: Cardiology

## 2018-06-09 ENCOUNTER — Ambulatory Visit (INDEPENDENT_AMBULATORY_CARE_PROVIDER_SITE_OTHER): Payer: Medicare Other | Admitting: Cardiology

## 2018-06-09 VITALS — BP 118/66 | HR 50 | Ht 72.0 in | Wt 261.0 lb

## 2018-06-09 DIAGNOSIS — Z86711 Personal history of pulmonary embolism: Secondary | ICD-10-CM

## 2018-06-09 DIAGNOSIS — Z86718 Personal history of other venous thrombosis and embolism: Secondary | ICD-10-CM | POA: Diagnosis not present

## 2018-06-09 DIAGNOSIS — I480 Paroxysmal atrial fibrillation: Secondary | ICD-10-CM | POA: Diagnosis not present

## 2018-06-09 DIAGNOSIS — G4733 Obstructive sleep apnea (adult) (pediatric): Secondary | ICD-10-CM

## 2018-06-09 HISTORY — DX: Personal history of pulmonary embolism: Z86.711

## 2018-06-09 HISTORY — DX: Personal history of other venous thrombosis and embolism: Z86.718

## 2018-06-09 HISTORY — DX: Paroxysmal atrial fibrillation: I48.0

## 2018-06-09 MED ORDER — DRONEDARONE HCL 400 MG PO TABS: 400.0000 mg | ORAL_TABLET | Freq: Two times a day (BID) | ORAL | Status: DC

## 2018-06-09 MED ORDER — DRONEDARONE HCL 400 MG PO TABS: 400.0000 mg | ORAL_TABLET | Freq: Two times a day (BID) | ORAL | 2 refills | Status: DC

## 2018-06-09 NOTE — Patient Instructions (Signed)
Medication Instructions:  Your physician has recommended you make the following change in your medication: STOP  Taking amiodorone START taking dronedarone 400 mg (1 tablet) 2 x's per day   If you need a refill on your cardiac medications before your next appointment, please call your pharmacy.   Lab work: NONE If you have labs (blood work) drawn today and your tests are completely normal, you will receive your results only by: Marland Kitchen MyChart Message (if you have MyChart) OR . A paper copy in the mail If you have any lab test that is abnormal or we need to change your treatment, we will call you to review the results.  Testing/Procedures: An EKG was performed today  Your physician has recommended that you have a sleep study. This test records several body functions during sleep, including: brain activity, eye movement, oxygen and carbon dioxide blood levels, heart rate and rhythm, breathing rate and rhythm, the flow of air through your mouth and nose, snoring, body muscle movements, and chest and belly movement.   You have been referred to pulmonary.   Follow-Up: At [facility], you and your health needs are our priority.  As part of our continuing mission to provide you with exceptional heart care, we have created designated Provider Care Teams.  These Care Teams include your primary Cardiologist (physician) and Advanced Practice Providers (APPs -  Physician Assistants and Nurse Practitioners) who all work together to provide you with the care you need, when you need it. You will need a follow up appointment in 1 months.    Any Other Special Instructions Will Be Listed Below  Dronedarone tablets What is this medicine? DRONEDARONE (droe NE da rone) is an antiarrhythmic drug. It helps make your heart beat regularly. This medicine may be used for other purposes; ask your health care provider or pharmacist if you have questions. COMMON BRAND NAME(S): Multaq What should I tell my health  care provider before I take this medicine? They need to know if you have any of these conditions: -heart failure -history of irregular heartbeat -liver disease -liver or lung problems with the past use of amiodarone -low levels of magnesium in the blood -low levels of potassium in the blood -other heart disease -an unusual or allergic reaction to dronedarone, other medicines, foods, dyes, or preservatives -pregnant or trying to get pregnant -breast-feeding How should I use this medicine? Take this medicine by mouth with a glass of water. Follow the directions on the prescription label. Take one tablet with the morning meal and one tablet with the evening meal. Do not take your medicine more often than directed. Do not stop taking except on the advice of your doctor or health care professional. A special MedGuide will be given to you by the pharmacist with each prescription and refill. Be sure to read this information carefully each time. Talk to your pediatrician regarding the use of this medicine in children. Special care may be needed. Overdosage: If you think you have taken too much of this medicine contact a poison control center or emergency room at once. NOTE: This medicine is only for you. Do not share this medicine with others. What if I miss a dose? If you miss a dose, take it as soon as you can. If it is almost time for your next dose, take only that dose. Do not take double or extra doses. What may interact with this medicine? Do not take this medicine with any of the following medications: -arsenic trioxide -  certain antibiotics like clarithromycin, erythromycin, pentamidine, telithromycin, troleandomycin -certain medicines for depression like tricyclic antidepressants -certain medicines for fungal infections like fluconazole, itraconazole, ketoconazole, posaconazole, voriconazole -certain medicines for irregular heart beat like amiodarone, disopyramide, dofetilide, flecainide,  ibutilide, quinidine, propafenone, sotalol -certain medicines for malaria like chloroquine, halofantrine -cisapride -cyclosporine -droperidol -haloperidol -methadone -other medicines that prolong the QT interval (cause an abnormal heart rhythm) -pimozide -nefazodone -phenothiazines like chlorpromazine, mesoridazine, prochlorperazine, thioridazine -ritonavir -ziprasidone This medicine may also interact with the following medications: -certain medicines for blood pressure, heart disease, or irregular heart beat like diltiazem, metoprolol, propranolol, verapamil -certain medicines for cholesterol like atorvastatin, lovastatin, simvastatin -certain medicines for seizures like carbamazepine, phenobarbital, phenytoin -digoxin -grapefruit juice -rifampin -sirolimus -St. John's Wort -tacrolimus This list may not describe all possible interactions. Give your health care provider a list of all the medicines, herbs, non-prescription drugs, or dietary supplements you use. Also tell them if you smoke, drink alcohol, or use illegal drugs. Some items may interact with your medicine. What should I watch for while using this medicine? Your condition will be monitored closely when you first begin therapy. Often, this drug is first started in a hospital or other monitored health care setting. Once you are on maintenance therapy, visit your doctor or health care professional for regular checks on your progress. Because your condition and use of this medicine carry some risk, it is a good idea to carry an identification card, necklace or bracelet with details of your condition, medications, and doctor or health care professional. Dennis Bast may get drowsy or dizzy. Do not drive, use machinery, or do anything that needs mental alertness until you know how this medicine affects you. Do not stand or sit up quickly, especially if you are an older patient. This reduces the risk of dizzy or fainting spells. What side  effects may I notice from receiving this medicine? Side effects that you should report to your doctor or health care professional as soon as possible: -allergic reactions like skin rash, itching or hives, swelling of the face, lips, or tongue -breathing problems -cough -dark urine -fast, irregular heartbeat -general ill feeling or flu-like symptoms -light-colored stools -loss of appetite, nausea -right upper belly pain -slow heartbeat -stomach pain -swelling of the legs or ankles -unusually weak or tired -weight gain -yellowing of the eyes or skin Side effects that usually do not require medical attention (report to your doctor or health care professional if they continue or are bothersome): -nausea -vomiting -stomach pain This list may not describe all possible side effects. Call your doctor for medical advice about side effects. You may report side effects to FDA at 1-800-FDA-1088. Where should I keep my medicine? Keep out of the reach of children. Store at room temperature between 15 and 30 degrees C (59 and 86 degrees F). Throw away any unused medicine after the expiration date. NOTE: This sheet is a summary. It may not cover all possible information. If you have questions about this medicine, talk to your doctor, pharmacist, or health care provider.  2019 Elsevier/Gold Standard (2015-04-20 12:43:06)

## 2018-06-09 NOTE — Progress Notes (Signed)
Cardiology Office Note:    Date:  06/09/2018   ID:  Ralph Powell, DOB 1945-06-08, MRN [id number]  PCP:  Ralph Roger, MD  Cardiologist:  Ralph Lindau, MD   Referring MD: Ralph Roger, MD    ASSESSMENT:    1. Paroxysmal atrial fibrillation (HCC)   2. History of pulmonary embolism   3. History of DVT (deep vein thrombosis)   4. OSA (obstructive sleep apnea)    PLAN:    In order of problems listed above:  1. Primary prevention stressed with the patient.  Importance of compliance with diet and medication stressed and he vocalized understanding.  He has COPD on home oxygen.  I have told him that he needs to be followed by pulmonologist closely as these issues might be connected with his atrial fibrillation. 2. In addition he has history of sleep apnea and does not use any sleep apnea therapy as directed by his doctors in the past.  We will make him an referral for sleep evaluation as this might have a direct effect on his atrial fibrillation. 3. I am not comfortable with him being on amiodarone because of COPD and being on home oxygen.  He also has history of alcohol abuse.  In view of this I will switch him to dronedrone.  We will stop the amiodarone therapy. 4. I discussed with the patient atrial fibrillation, disease process. Management and therapy including rate and rhythm control, anticoagulation benefits and potential risks were discussed extensively with the patient. Patient had multiple questions which were answered to patient's satisfaction. 5. Benefits and potential risks of a forementioned medications were discussed with the patient and he vocalized understanding.  Alcohol and he tells me that he has not used alcohol since hospital discharge and I congratulated. 6. Follow-up appointment in a month or earlier if he has any questions.   Medication Adjustments/Labs and Tests Ordered: Current medicines are reviewed at length with the patient today.  Concerns regarding  medicines are outlined above.  No orders of the defined types were placed in this encounter.  No orders of the defined types were placed in this encounter.    History of Present Illness:    Ralph Powell is a 73 y.o. male who is being seen today for the evaluation of paroxysmal atrial fibrillation at the request of Ralph Roger, MD.  Patient is a pleasant 73 year old male.  He has past medical history of smoking.  He quit a few years ago.  He has history of alcohol abuse.  He tells me that he reduced taking alcohol in the past few weeks.  He gives history of COPD he is on home oxygen 24 hours.  He denies any chest pain orthopnea or PND.  He was admitted to [facility] with paroxysmal atrial fibrillation.  He had rapid ventricular rate and underwent cardioversion this rhythm is here for follow-up.  He is on anticoagulation.  At the time of my evaluation, the patient is alert awake oriented and in no distress.  He gives history of obstructive sleep apnea but does not use any sleep aids at this time.  Past Medical History:  Diagnosis Date  . Arthritis   . COPD (chronic obstructive pulmonary disease) ([place])   . DVT, lower extremity ([place])   . Gout   . History of pulmonary embolus (PE)   . Hyperlipidemia   . Hypertension   . Obesity   . Obstructive sleep apnea   . Renal carcinoma ([place])  History reviewed. No pertinent surgical history.  Current Medications: Current Meds  Medication Sig  . acetaminophen (TYLENOL) 325 MG tablet Take 650 mg by mouth every 4 (four) hours as needed.   Marland Kitchen allopurinol (ZYLOPRIM) 100 MG tablet Take 1 tablet by mouth daily.  Marland Kitchen amiodarone (PACERONE) 200 MG tablet Take 1 tablet by mouth daily.  . bethanechol (URECHOLINE) 50 MG tablet Take 50 mg by mouth 2 (two) times daily.  . furosemide (LASIX) 20 MG tablet Take 20 mg by mouth.  . gabapentin (NEURONTIN) 100 MG capsule Take 1 capsule by mouth 3 (three) times daily.  . Heparin Sod, Porcine, in D5W (HEPARIN,  PORCINE,) 50-5 UNIT/ML-% infusion Inject into the vein.  . metoprolol tartrate (LOPRESSOR) 25 MG tablet Take 0.5 tablets by mouth 2 (two) times daily.  . Misc Natural Products (TART CHERRY ADVANCED PO) Take by mouth.  . rivaroxaban (XARELTO) 20 MG TABS tablet Take 20 mg by mouth.  . tamsulosin (FLOMAX) 0.4 MG CAPS capsule Take 1 capsule by mouth daily.  Marland Kitchen thiamine 100 MG tablet Take 100 mg by mouth.     Allergies:   Oxycodone and Codeine   Social History   Socioeconomic History  . Marital status: Married    Spouse name: Not on file  . Number of children: Not on file  . Years of education: Not on file  . Highest education level: Not on file  Occupational History  . Not on file  Social Needs  . Financial resource strain: Not on file  . Food insecurity:    Worry: Not on file    Inability: Not on file  . Transportation needs:    Medical: Not on file    Non-medical: Not on file  Tobacco Use  . Smoking status: Former Research scientist (life sciences)  . Smokeless tobacco: Never Used  Substance and Sexual Activity  . Alcohol use: Not on file  . Drug use: Not on file  . Sexual activity: Not on file  Lifestyle  . Physical activity:    Days per week: Not on file    Minutes per session: Not on file  . Stress: Not on file  Relationships  . Social connections:    Talks on phone: Not on file    Gets together: Not on file    Attends religious service: Not on file    Active member of club or organization: Not on file    Attends meetings of clubs or organizations: Not on file    Relationship status: Not on file  Other Topics Concern  . Not on file  Social History Narrative  . Not on file     Family History: The patient's family history is not on file.  ROS:   Please see the history of present illness.    All other systems reviewed and are negative.  EKGs/Labs/Other Studies Reviewed:    The following studies were reviewed today: I reviewed [facility] records extensively.   Recent  Labs: No results found for requested labs within last 8760 hours.  Recent Lipid Panel No results found for: CHOL, TRIG, HDL, CHOLHDL, VLDL, LDLCALC, LDLDIRECT  Physical Exam:    VS:  BP 118/66 (BP Location: Right Arm, Patient Position: Sitting, Cuff Size: Normal)   Pulse (!) 50   Ht 6' (1.829 m)   Wt 261 lb (118.4 kg)   SpO2 98%   BMI 35.40 kg/m     Wt Readings from Last 3 Encounters:  06/09/18 261 lb (118.4 kg)  GEN: Patient is in no acute distress HEENT: Normal NECK: No JVD; No carotid bruits LYMPHATICS: No lymphadenopathy CARDIAC: S1 S2 regular, 2/6 systolic murmur at the apex. RESPIRATORY:  Clear to auscultation without rales, wheezing or rhonchi  ABDOMEN: Soft, non-tender, non-distended MUSCULOSKELETAL:  No edema; No deformity  SKIN: Warm and dry NEUROLOGIC:  Alert and oriented x 3 PSYCHIATRIC:  Normal affect    Signed, Ralph Lindau, MD  06/09/2018 9:25 AM    Ralph Powell

## 2018-06-09 NOTE — Addendum Note (Signed)
Addended by: Beckey Rutter on: 06/09/2018 05:01 PM   Modules accepted: Orders

## 2018-06-09 NOTE — Addendum Note (Signed)
Addended by: Austin Miles on: 06/09/2018 04:58 PM   Modules accepted: Orders

## 2018-06-10 DIAGNOSIS — J439 Emphysema, unspecified: Secondary | ICD-10-CM | POA: Diagnosis not present

## 2018-06-10 DIAGNOSIS — I4891 Unspecified atrial fibrillation: Secondary | ICD-10-CM | POA: Diagnosis not present

## 2018-06-10 DIAGNOSIS — I130 Hypertensive heart and chronic kidney disease with heart failure and stage 1 through stage 4 chronic kidney disease, or unspecified chronic kidney disease: Secondary | ICD-10-CM | POA: Diagnosis not present

## 2018-06-10 DIAGNOSIS — J9621 Acute and chronic respiratory failure with hypoxia: Secondary | ICD-10-CM | POA: Diagnosis not present

## 2018-06-10 DIAGNOSIS — J189 Pneumonia, unspecified organism: Secondary | ICD-10-CM | POA: Diagnosis not present

## 2018-06-10 DIAGNOSIS — J9622 Acute and chronic respiratory failure with hypercapnia: Secondary | ICD-10-CM | POA: Diagnosis not present

## 2018-06-11 ENCOUNTER — Telehealth: Payer: Self-pay | Admitting: Cardiology

## 2018-06-11 DIAGNOSIS — J439 Emphysema, unspecified: Secondary | ICD-10-CM | POA: Diagnosis not present

## 2018-06-11 DIAGNOSIS — I130 Hypertensive heart and chronic kidney disease with heart failure and stage 1 through stage 4 chronic kidney disease, or unspecified chronic kidney disease: Secondary | ICD-10-CM | POA: Diagnosis not present

## 2018-06-11 DIAGNOSIS — J9621 Acute and chronic respiratory failure with hypoxia: Secondary | ICD-10-CM | POA: Diagnosis not present

## 2018-06-11 DIAGNOSIS — J9622 Acute and chronic respiratory failure with hypercapnia: Secondary | ICD-10-CM | POA: Diagnosis not present

## 2018-06-11 DIAGNOSIS — J189 Pneumonia, unspecified organism: Secondary | ICD-10-CM | POA: Diagnosis not present

## 2018-06-11 DIAGNOSIS — I4891 Unspecified atrial fibrillation: Secondary | ICD-10-CM | POA: Diagnosis not present

## 2018-06-11 MED ORDER — METOPROLOL TARTRATE 25 MG PO TABS: 12.5000 mg | ORAL_TABLET | Freq: Two times a day (BID) | ORAL | 3 refills | Status: DC

## 2018-06-11 NOTE — Addendum Note (Signed)
Addended by: Beckey Rutter on: 06/11/2018 10:58 AM   Modules accepted: Orders

## 2018-06-11 NOTE — Telephone Encounter (Signed)
Call dronedarone and metoprolol to [facility] in [place]

## 2018-06-15 ENCOUNTER — Telehealth: Payer: Self-pay | Admitting: Cardiology

## 2018-06-15 DIAGNOSIS — J9622 Acute and chronic respiratory failure with hypercapnia: Secondary | ICD-10-CM | POA: Diagnosis not present

## 2018-06-15 DIAGNOSIS — J189 Pneumonia, unspecified organism: Secondary | ICD-10-CM | POA: Diagnosis not present

## 2018-06-15 DIAGNOSIS — J439 Emphysema, unspecified: Secondary | ICD-10-CM | POA: Diagnosis not present

## 2018-06-15 DIAGNOSIS — I130 Hypertensive heart and chronic kidney disease with heart failure and stage 1 through stage 4 chronic kidney disease, or unspecified chronic kidney disease: Secondary | ICD-10-CM | POA: Diagnosis not present

## 2018-06-15 DIAGNOSIS — J9621 Acute and chronic respiratory failure with hypoxia: Secondary | ICD-10-CM | POA: Diagnosis not present

## 2018-06-15 DIAGNOSIS — I4891 Unspecified atrial fibrillation: Secondary | ICD-10-CM | POA: Diagnosis not present

## 2018-06-15 NOTE — Telephone Encounter (Signed)
Was switched medications when he was here last but it is too expensive and wants to know if he can just stay on what he was on

## 2018-06-17 NOTE — Telephone Encounter (Signed)
Unfortunately with his lung condition there is not much of a choice.  Please check if there is any patient assistance program with the dronedarone.

## 2018-06-18 DIAGNOSIS — J189 Pneumonia, unspecified organism: Secondary | ICD-10-CM | POA: Diagnosis not present

## 2018-06-18 DIAGNOSIS — I4891 Unspecified atrial fibrillation: Secondary | ICD-10-CM | POA: Diagnosis not present

## 2018-06-18 DIAGNOSIS — I130 Hypertensive heart and chronic kidney disease with heart failure and stage 1 through stage 4 chronic kidney disease, or unspecified chronic kidney disease: Secondary | ICD-10-CM | POA: Diagnosis not present

## 2018-06-18 DIAGNOSIS — J9621 Acute and chronic respiratory failure with hypoxia: Secondary | ICD-10-CM | POA: Diagnosis not present

## 2018-06-18 DIAGNOSIS — J9622 Acute and chronic respiratory failure with hypercapnia: Secondary | ICD-10-CM | POA: Diagnosis not present

## 2018-06-18 DIAGNOSIS — J439 Emphysema, unspecified: Secondary | ICD-10-CM | POA: Diagnosis not present

## 2018-06-18 NOTE — Telephone Encounter (Signed)
Patient given [facility] program and patient assistance paperwork along with 3 Pk samples of multaq.

## 2018-06-25 ENCOUNTER — Telehealth: Payer: Self-pay

## 2018-06-25 DIAGNOSIS — J189 Pneumonia, unspecified organism: Secondary | ICD-10-CM | POA: Diagnosis not present

## 2018-06-25 DIAGNOSIS — I130 Hypertensive heart and chronic kidney disease with heart failure and stage 1 through stage 4 chronic kidney disease, or unspecified chronic kidney disease: Secondary | ICD-10-CM | POA: Diagnosis not present

## 2018-06-25 DIAGNOSIS — J9622 Acute and chronic respiratory failure with hypercapnia: Secondary | ICD-10-CM | POA: Diagnosis not present

## 2018-06-25 DIAGNOSIS — I4891 Unspecified atrial fibrillation: Secondary | ICD-10-CM | POA: Diagnosis not present

## 2018-06-25 DIAGNOSIS — J439 Emphysema, unspecified: Secondary | ICD-10-CM | POA: Diagnosis not present

## 2018-06-25 DIAGNOSIS — J9621 Acute and chronic respiratory failure with hypoxia: Secondary | ICD-10-CM | POA: Diagnosis not present

## 2018-06-25 NOTE — Telephone Encounter (Signed)
[facility] nurse called with questions about dronedarone rx. Patient told her that they can not afford medication and would like to get samples. D.Kyree Fedorko, RN informed Ralph Powell that patient was supplied with samples a couple days ago and completed patient assistance information has been submitted already for price breaks.

## 2018-06-29 ENCOUNTER — Telehealth: Payer: Self-pay

## 2018-06-29 NOTE — Telephone Encounter (Signed)
Patient assistance [employer] pg 2-3 mailed to patient for signature on multaq.

## 2018-07-02 ENCOUNTER — Telehealth: Payer: Self-pay

## 2018-07-02 NOTE — Telephone Encounter (Signed)

## 2018-07-06 NOTE — Telephone Encounter (Signed)
televisit rescheduled, verified patient assistance information

## 2018-07-07 NOTE — Telephone Encounter (Signed)
PT verbally consented by phone

## 2018-07-08 ENCOUNTER — Encounter: Payer: Self-pay | Admitting: Cardiology

## 2018-07-08 ENCOUNTER — Other Ambulatory Visit: Payer: Self-pay

## 2018-07-08 ENCOUNTER — Telehealth (INDEPENDENT_AMBULATORY_CARE_PROVIDER_SITE_OTHER): Payer: Medicare Other | Admitting: Cardiology

## 2018-07-08 VITALS — BP 127/59 | HR 49 | Ht 72.0 in | Wt 282.0 lb

## 2018-07-08 DIAGNOSIS — I480 Paroxysmal atrial fibrillation: Secondary | ICD-10-CM | POA: Diagnosis not present

## 2018-07-08 DIAGNOSIS — R609 Edema, unspecified: Secondary | ICD-10-CM

## 2018-07-08 DIAGNOSIS — Z86711 Personal history of pulmonary embolism: Secondary | ICD-10-CM

## 2018-07-08 DIAGNOSIS — I471 Supraventricular tachycardia, unspecified: Secondary | ICD-10-CM

## 2018-07-08 DIAGNOSIS — Z86718 Personal history of other venous thrombosis and embolism: Secondary | ICD-10-CM

## 2018-07-08 DIAGNOSIS — J431 Panlobular emphysema: Secondary | ICD-10-CM

## 2018-07-08 DIAGNOSIS — R0602 Shortness of breath: Secondary | ICD-10-CM

## 2018-07-08 DIAGNOSIS — G4733 Obstructive sleep apnea (adult) (pediatric): Secondary | ICD-10-CM

## 2018-07-08 DIAGNOSIS — F1010 Alcohol abuse, uncomplicated: Secondary | ICD-10-CM

## 2018-07-08 DIAGNOSIS — Z85528 Personal history of other malignant neoplasm of kidney: Secondary | ICD-10-CM

## 2018-07-08 NOTE — Addendum Note (Signed)
Addended by: Beckey Rutter on: 07/08/2018 11:24 AM   Modules accepted: Orders

## 2018-07-08 NOTE — Progress Notes (Signed)
Virtual Visit via Telephone Note   This visit type was conducted due to national recommendations for restrictions regarding the COVID-19 Pandemic (e.g. social distancing) in an effort to limit this patient's exposure and mitigate transmission in our community.  Due to his co-morbid illnesses, this patient is at least at moderate risk for complications without adequate follow up.  This format is felt to be most appropriate for this patient at this time.  The patient did not have access to video technology/had technical difficulties with video requiring transitioning to audio format only (telephone).  All issues noted in this document were discussed and addressed.  No physical exam could be performed with this format.  Please refer to the patient's chart for his  consent to telehealth for [facility].   Evaluation Performed:  Follow-up visit  Date:  07/08/2018   ID:  Ralph, Powell [birth year], MRN [id number]  Patient Location: Home  Provider Location: Home  PCP:  Ralph Roger, MD  Cardiologist:  No primary care provider on file.  Electrophysiologist:  None   Chief Complaint:  PAF  History of Present Illness:    Ralph Powell is a 73 y.o. male who presents via audio/video conferencing for a telehealth visit today.    Patient mentions to me that he is feeling fine but has swelling in the lower extremities.  His furosemide was increased by his primary care physician to 40 mg a day but this has not helped.  No chest pain orthopnea or PND.  At the time of my evaluation, the patient is alert awake oriented and in no distress.  The patient mentions to me that since starting dronedarone he has gained weight about 15 pounds but he is not sure about it because their scale is not accurate at home.  His wife was on the phone and very supportive during this time.  The patient does not have symptoms concerning for COVID-19 infection (fever, chills, cough, or new shortness of breath).    Past  Medical History:  Diagnosis Date  . Arthritis   . COPD (chronic obstructive pulmonary disease) ([place])   . DVT, lower extremity ([place])   . Gout   . History of pulmonary embolus (PE)   . Hyperlipidemia   . Hypertension   . Obesity   . Obstructive sleep apnea   . Renal carcinoma ([place])    History reviewed. No pertinent surgical history.   Current Meds  Medication Sig  . acetaminophen (TYLENOL) 325 MG tablet Take 650 mg by mouth every 4 (four) hours as needed.   Marland Kitchen allopurinol (ZYLOPRIM) 100 MG tablet Take 1 tablet by mouth daily.  . bethanechol (URECHOLINE) 50 MG tablet Take 50 mg by mouth 2 (two) times daily.  Marland Kitchen dronedarone (MULTAQ) 400 MG tablet Take 1 tablet (400 mg total) by mouth 2 (two) times daily with a meal.  . furosemide (LASIX) 20 MG tablet Take 20 mg by mouth.  . gabapentin (NEURONTIN) 100 MG capsule Take 1 capsule by mouth 3 (three) times daily.  . Heparin Sod, Porcine, in D5W (HEPARIN, PORCINE,) 50-5 UNIT/ML-% infusion Inject into the vein.  . metoprolol tartrate (LOPRESSOR) 25 MG tablet Take 0.5 tablets (12.5 mg total) by mouth 2 (two) times daily.  . Misc Natural Products (TART CHERRY ADVANCED PO) Take by mouth.  . rivaroxaban (XARELTO) 20 MG TABS tablet Take 20 mg by mouth.  . tamsulosin (FLOMAX) 0.4 MG CAPS capsule Take 1 capsule by mouth daily.  Marland Kitchen thiamine 100 MG tablet  Take 100 mg by mouth.     Allergies:   Oxycodone and Codeine   Social History   Tobacco Use  . Smoking status: Former Research scientist (life sciences)  . Smokeless tobacco: Never Used  Substance Use Topics  . Alcohol use: Not on file  . Drug use: Not on file     Family Hx: The patient's family history is not on file.  ROS:   Please see the history of present illness.    No other symptoms All other systems reviewed and are negative.   Prior CV studies:   The following studies were reviewed today:  I discussed findings with patient  Labs/Other Tests and Data Reviewed:    EKG:  No ECG reviewed.  Recent  Labs: No results found for requested labs within last 8760 hours.   Recent Lipid Panel No results found for: CHOL, TRIG, HDL, CHOLHDL, LDLCALC, LDLDIRECT  Wt Readings from Last 3 Encounters:  07/08/18 282 lb (127.9 kg)  06/09/18 261 lb (118.4 kg)     Objective:    Vital Signs:  BP (!) 127/59 (BP Location: Left Arm, Patient Position: Sitting, Cuff Size: Normal)   Pulse (!) 49   Ht 6' (1.829 m)   Wt 282 lb (127.9 kg)   BMI 38.25 kg/m    Well nourished, well developed male in no acute distress. He appeared comfortable over the phone   ASSESSMENT & PLAN:    1. Paroxysmal atrial fibrillation: I discussed with the patient atrial fibrillation, disease process. Management and therapy including rate and rhythm control, anticoagulation benefits and potential risks were discussed extensively with the patient. Patient had multiple questions which were answered to patient's satisfaction. 2. The anticoagulation will also help in view of his history of DVT and pulmonary thromboembolism in the past 3. Think dietary is much choice for his medications.  He feels that dronedarone is making him gain weight but with history of alcohol abuse in the past and smoking and COPD I think amiodarone medication will be too risky.  He will continue his current medications at this time. 4. We will get him to our office to get BNP BMP and CBC.  This will help me assess his diuretic therapy.  I may change him to medications like torsemide or metaxalone based on the results of these tests. 5. Patient will be seen in follow-up appointment in 2 weeks or earlier if the patient has any concerns   COVID-19 Education: The signs and symptoms of COVID-19 were discussed with the patient and how to seek care for testing (follow up with PCP or arrange E-visit).  The importance of social distancing was discussed today.  Time:   Today, I have spent 22 minutes with the patient with telehealth technology discussing the above  problems.     Medication Adjustments/Labs and Tests Ordered: Current medicines are reviewed at length with the patient today.  Concerns regarding medicines are outlined above.  Tests Ordered: No orders of the defined types were placed in this encounter.  Medication Changes: No orders of the defined types were placed in this encounter.   Disposition:  Follow up in 2 week(s)  Signed, Jenean Lindau, MD  07/08/2018 10:03 AM    [place]

## 2018-07-08 NOTE — Patient Instructions (Signed)
RN called and verbalized all information listed below on 04/8 at 1120 am to patients wife. All medications were reconciled.    Medication Instructions:  Your physician recommends that you continue on your current medications as directed. Please refer to the Current Medication list given to you today.  If you need a refill on your cardiac medications before your next appointment, please call your pharmacy.   Lab work: None today. Instructed to come into office any day next week FASTING between 8-4 except 12-1 for CBC,BNP and BMP to be drawn.  If you have labs (blood work) drawn today and your tests are completely normal, you will receive your results only by: Marland Kitchen MyChart Message (if you have MyChart) OR . A paper copy in the mail If you have any lab test that is abnormal or we need to change your treatment, we will call you to review the results.  Testing/Procedures: NONE  Follow-Up: At [facility], you and your health needs are our priority.  As part of our continuing mission to provide you with exceptional heart care, we have created designated Provider Care Teams.  These Care Teams include your primary Cardiologist (physician) and Advanced Practice Providers (APPs -  Physician Assistants and Nurse Practitioners) who all work together to provide you with the care you need, when you need it. You will need a follow up appointment in 2 weeks.

## 2018-07-10 ENCOUNTER — Telehealth: Payer: Medicare Other | Admitting: Cardiology

## 2018-07-10 DIAGNOSIS — I471 Supraventricular tachycardia: Secondary | ICD-10-CM | POA: Diagnosis not present

## 2018-07-10 DIAGNOSIS — I480 Paroxysmal atrial fibrillation: Secondary | ICD-10-CM | POA: Diagnosis not present

## 2018-07-10 DIAGNOSIS — R0602 Shortness of breath: Secondary | ICD-10-CM | POA: Diagnosis not present

## 2018-07-10 DIAGNOSIS — Z86711 Personal history of pulmonary embolism: Secondary | ICD-10-CM | POA: Diagnosis not present

## 2018-07-10 DIAGNOSIS — R609 Edema, unspecified: Secondary | ICD-10-CM | POA: Diagnosis not present

## 2018-07-10 DIAGNOSIS — Z86718 Personal history of other venous thrombosis and embolism: Secondary | ICD-10-CM | POA: Diagnosis not present

## 2018-07-10 NOTE — Addendum Note (Signed)
Addended by: Ashok Norris on: 07/10/2018 08:44 AM   Modules accepted: Orders

## 2018-07-11 LAB — BASIC METABOLIC PANEL
BUN/Creatinine Ratio: 16 (ref 10–24)
BUN: 33 mg/dL — ABNORMAL HIGH (ref 8–27)
CO2: 27 mmol/L (ref 20–29)
Calcium: 8.6 mg/dL (ref 8.6–10.2)
Chloride: 100 mmol/L (ref 96–106)
Creatinine, Ser: 2.09 mg/dL — ABNORMAL HIGH (ref 0.76–1.27)
GFR calc Af Amer: 36 mL/min/{1.73_m2} — ABNORMAL LOW (ref >59)
GFR calc non Af Amer: 31 mL/min/{1.73_m2} — ABNORMAL LOW (ref >59)
Glucose: 101 mg/dL — ABNORMAL HIGH (ref 65–99)
Potassium: 4.5 mmol/L (ref 3.5–5.2)
Sodium: 143 mmol/L (ref 134–144)

## 2018-07-11 LAB — CBC
Hematocrit: 32.1 % — ABNORMAL LOW (ref 37.5–51.0)
Hemoglobin: 10.7 g/dL — ABNORMAL LOW (ref 13.0–17.7)
MCH: 32.6 pg (ref 26.6–33.0)
MCHC: 33.3 g/dL (ref 31.5–35.7)
MCV: 98 fL — ABNORMAL HIGH (ref 79–97)
Platelets: 144 10*3/uL — ABNORMAL LOW (ref 150–450)
RBC: 3.28 x10E6/uL — ABNORMAL LOW (ref 4.14–5.80)
RDW: 12.9 % (ref 11.6–15.4)
WBC: 6.4 10*3/uL (ref 3.4–10.8)

## 2018-07-11 LAB — PRO B NATRIURETIC PEPTIDE: NT-Pro BNP: 1888 pg/mL — ABNORMAL HIGH (ref 0–376)

## 2018-07-13 ENCOUNTER — Telehealth: Payer: Self-pay

## 2018-07-13 DIAGNOSIS — I4891 Unspecified atrial fibrillation: Secondary | ICD-10-CM | POA: Diagnosis not present

## 2018-07-13 DIAGNOSIS — R0902 Hypoxemia: Secondary | ICD-10-CM | POA: Diagnosis not present

## 2018-07-13 DIAGNOSIS — K8020 Calculus of gallbladder without cholecystitis without obstruction: Secondary | ICD-10-CM | POA: Diagnosis not present

## 2018-07-13 DIAGNOSIS — R0602 Shortness of breath: Secondary | ICD-10-CM | POA: Diagnosis not present

## 2018-07-13 DIAGNOSIS — Z79899 Other long term (current) drug therapy: Secondary | ICD-10-CM | POA: Diagnosis not present

## 2018-07-13 DIAGNOSIS — Z9981 Dependence on supplemental oxygen: Secondary | ICD-10-CM | POA: Diagnosis not present

## 2018-07-13 DIAGNOSIS — R5381 Other malaise: Secondary | ICD-10-CM | POA: Diagnosis not present

## 2018-07-13 DIAGNOSIS — N2889 Other specified disorders of kidney and ureter: Secondary | ICD-10-CM | POA: Diagnosis not present

## 2018-07-13 DIAGNOSIS — Z87891 Personal history of nicotine dependence: Secondary | ICD-10-CM | POA: Diagnosis not present

## 2018-07-13 DIAGNOSIS — K219 Gastro-esophageal reflux disease without esophagitis: Secondary | ICD-10-CM | POA: Diagnosis not present

## 2018-07-13 DIAGNOSIS — J449 Chronic obstructive pulmonary disease, unspecified: Secondary | ICD-10-CM | POA: Diagnosis not present

## 2018-07-13 DIAGNOSIS — R188 Other ascites: Secondary | ICD-10-CM | POA: Diagnosis not present

## 2018-07-13 DIAGNOSIS — N179 Acute kidney failure, unspecified: Secondary | ICD-10-CM | POA: Diagnosis not present

## 2018-07-13 DIAGNOSIS — R531 Weakness: Secondary | ICD-10-CM | POA: Diagnosis not present

## 2018-07-13 DIAGNOSIS — I509 Heart failure, unspecified: Secondary | ICD-10-CM | POA: Diagnosis not present

## 2018-07-13 DIAGNOSIS — K5730 Diverticulosis of large intestine without perforation or abscess without bleeding: Secondary | ICD-10-CM | POA: Diagnosis not present

## 2018-07-13 DIAGNOSIS — I110 Hypertensive heart disease with heart failure: Secondary | ICD-10-CM | POA: Diagnosis not present

## 2018-07-13 DIAGNOSIS — E860 Dehydration: Secondary | ICD-10-CM | POA: Diagnosis not present

## 2018-07-13 DIAGNOSIS — I700 Atherosclerosis of aorta: Secondary | ICD-10-CM | POA: Diagnosis not present

## 2018-07-13 DIAGNOSIS — Z905 Acquired absence of kidney: Secondary | ICD-10-CM | POA: Diagnosis not present

## 2018-07-13 DIAGNOSIS — K429 Umbilical hernia without obstruction or gangrene: Secondary | ICD-10-CM | POA: Diagnosis not present

## 2018-07-13 MED ORDER — TORSEMIDE 10 MG PO TABS: 10.0000 mg | ORAL_TABLET | Freq: Two times a day (BID) | ORAL | 2 refills | Status: DC

## 2018-07-13 NOTE — Telephone Encounter (Signed)
RN sent copy of results to Dr.Van [facility] office. Called patients wife Ralph Powell and she is very distraught. [name] states her husband is not sleeping. He was up all night hallucinating that people were in his room. He has small amount of urine output and not able to follow commands. Patient is trembling so much he can not feed himself or hold any items in his hand. Wife feels he has gained 25 lbs since starting dronedarone on 06/09/2018.She states she is not able to get current weight because patient will not follow instruction or stand still. [name] was instructed to d/c furosemide and start torsemide 10 mg 2x's daily Per Dr.RRR request. Stop taking dronedarone immediately until patient seeks medical attention. RN called Dr.Van Eyk's office and spoke to [place] (nurse). To try and escalate his visit.Dr.Van Eyk's office is seeing patients but based off patients urgent medical changes, patient is being sent to ED.

## 2018-07-14 NOTE — Telephone Encounter (Signed)
Patient went to ED yesterday and was discharged home last night with instructions to increase fluids. Wife feels he is declining rapidly at home and very upset on next step to take. Patient has follow up appt with Dr. Ebbie Ridge office tomorrow morning at 10 am. He has not urinated since yesterday morning, discontinued dronedarone/lasix and still not taking any fluids in. Wife will pick up torsemide today. RN advised Mrs.Alms to call Dr. Ebbie Ridge office and request an immediate visit-today if possible. No further questions at this time, Rn advised she will call back tomorrow to check in with patient.

## 2018-07-15 ENCOUNTER — Emergency Department (HOSPITAL_COMMUNITY): Payer: Medicare Other

## 2018-07-15 ENCOUNTER — Other Ambulatory Visit: Payer: Self-pay

## 2018-07-15 ENCOUNTER — Inpatient Hospital Stay (HOSPITAL_COMMUNITY): Payer: Medicare Other

## 2018-07-15 ENCOUNTER — Inpatient Hospital Stay (HOSPITAL_COMMUNITY)
Admission: EM | Admit: 2018-07-15 | Discharge: 2018-07-22 | DRG: 286 | Disposition: A | Discharge status: Home Health | Payer: Medicare Other | Attending: Internal Medicine | Admitting: Internal Medicine

## 2018-07-15 ENCOUNTER — Encounter (HOSPITAL_COMMUNITY): Payer: Self-pay | Admitting: Emergency Medicine

## 2018-07-15 ENCOUNTER — Telehealth: Payer: Self-pay

## 2018-07-15 DIAGNOSIS — Z781 Physical restraint status: Secondary | ICD-10-CM

## 2018-07-15 DIAGNOSIS — E785 Hyperlipidemia, unspecified: Secondary | ICD-10-CM | POA: Diagnosis present

## 2018-07-15 DIAGNOSIS — R4182 Altered mental status, unspecified: Secondary | ICD-10-CM | POA: Diagnosis not present

## 2018-07-15 DIAGNOSIS — I110 Hypertensive heart disease with heart failure: Secondary | ICD-10-CM | POA: Diagnosis not present

## 2018-07-15 DIAGNOSIS — G4733 Obstructive sleep apnea (adult) (pediatric): Secondary | ICD-10-CM | POA: Diagnosis not present

## 2018-07-15 DIAGNOSIS — E8770 Fluid overload, unspecified: Secondary | ICD-10-CM | POA: Diagnosis not present

## 2018-07-15 DIAGNOSIS — R443 Hallucinations, unspecified: Secondary | ICD-10-CM | POA: Diagnosis present

## 2018-07-15 DIAGNOSIS — I509 Heart failure, unspecified: Secondary | ICD-10-CM

## 2018-07-15 DIAGNOSIS — M25562 Pain in left knee: Secondary | ICD-10-CM | POA: Diagnosis present

## 2018-07-15 DIAGNOSIS — I4510 Unspecified right bundle-branch block: Secondary | ICD-10-CM | POA: Diagnosis present

## 2018-07-15 DIAGNOSIS — N182 Chronic kidney disease, stage 2 (mild): Secondary | ICD-10-CM | POA: Diagnosis present

## 2018-07-15 DIAGNOSIS — G9340 Encephalopathy, unspecified: Secondary | ICD-10-CM | POA: Diagnosis present

## 2018-07-15 DIAGNOSIS — R001 Bradycardia, unspecified: Secondary | ICD-10-CM | POA: Diagnosis present

## 2018-07-15 DIAGNOSIS — I480 Paroxysmal atrial fibrillation: Secondary | ICD-10-CM | POA: Diagnosis present

## 2018-07-15 DIAGNOSIS — Z85528 Personal history of other malignant neoplasm of kidney: Secondary | ICD-10-CM

## 2018-07-15 DIAGNOSIS — M25561 Pain in right knee: Secondary | ICD-10-CM | POA: Diagnosis present

## 2018-07-15 DIAGNOSIS — J9622 Acute and chronic respiratory failure with hypercapnia: Secondary | ICD-10-CM | POA: Diagnosis present

## 2018-07-15 DIAGNOSIS — R34 Anuria and oliguria: Secondary | ICD-10-CM | POA: Diagnosis present

## 2018-07-15 DIAGNOSIS — J449 Chronic obstructive pulmonary disease, unspecified: Secondary | ICD-10-CM | POA: Diagnosis present

## 2018-07-15 DIAGNOSIS — N179 Acute kidney failure, unspecified: Secondary | ICD-10-CM | POA: Diagnosis not present

## 2018-07-15 DIAGNOSIS — E662 Morbid (severe) obesity with alveolar hypoventilation: Secondary | ICD-10-CM | POA: Diagnosis present

## 2018-07-15 DIAGNOSIS — I340 Nonrheumatic mitral (valve) insufficiency: Secondary | ICD-10-CM | POA: Diagnosis not present

## 2018-07-15 DIAGNOSIS — Z8673 Personal history of transient ischemic attack (TIA), and cerebral infarction without residual deficits: Secondary | ICD-10-CM

## 2018-07-15 DIAGNOSIS — I5031 Acute diastolic (congestive) heart failure: Secondary | ICD-10-CM | POA: Diagnosis not present

## 2018-07-15 DIAGNOSIS — I2723 Pulmonary hypertension due to lung diseases and hypoxia: Secondary | ICD-10-CM | POA: Diagnosis present

## 2018-07-15 DIAGNOSIS — G9341 Metabolic encephalopathy: Secondary | ICD-10-CM | POA: Diagnosis present

## 2018-07-15 DIAGNOSIS — M109 Gout, unspecified: Secondary | ICD-10-CM | POA: Diagnosis present

## 2018-07-15 DIAGNOSIS — Z885 Allergy status to narcotic agent status: Secondary | ICD-10-CM

## 2018-07-15 DIAGNOSIS — J9621 Acute and chronic respiratory failure with hypoxia: Secondary | ICD-10-CM | POA: Diagnosis present

## 2018-07-15 DIAGNOSIS — I10 Essential (primary) hypertension: Secondary | ICD-10-CM | POA: Diagnosis not present

## 2018-07-15 DIAGNOSIS — Z79899 Other long term (current) drug therapy: Secondary | ICD-10-CM

## 2018-07-15 DIAGNOSIS — Z905 Acquired absence of kidney: Secondary | ICD-10-CM | POA: Diagnosis not present

## 2018-07-15 DIAGNOSIS — I361 Nonrheumatic tricuspid (valve) insufficiency: Secondary | ICD-10-CM | POA: Diagnosis not present

## 2018-07-15 DIAGNOSIS — I130 Hypertensive heart and chronic kidney disease with heart failure and stage 1 through stage 4 chronic kidney disease, or unspecified chronic kidney disease: Secondary | ICD-10-CM | POA: Diagnosis present

## 2018-07-15 DIAGNOSIS — I2609 Other pulmonary embolism with acute cor pulmonale: Secondary | ICD-10-CM | POA: Diagnosis present

## 2018-07-15 DIAGNOSIS — I5082 Biventricular heart failure: Secondary | ICD-10-CM | POA: Diagnosis present

## 2018-07-15 DIAGNOSIS — J431 Panlobular emphysema: Secondary | ICD-10-CM

## 2018-07-15 DIAGNOSIS — R918 Other nonspecific abnormal finding of lung field: Secondary | ICD-10-CM | POA: Diagnosis present

## 2018-07-15 DIAGNOSIS — F1011 Alcohol abuse, in remission: Secondary | ICD-10-CM | POA: Diagnosis not present

## 2018-07-15 DIAGNOSIS — E8779 Other fluid overload: Secondary | ICD-10-CM

## 2018-07-15 DIAGNOSIS — I5033 Acute on chronic diastolic (congestive) heart failure: Secondary | ICD-10-CM | POA: Diagnosis present

## 2018-07-15 DIAGNOSIS — Z7901 Long term (current) use of anticoagulants: Secondary | ICD-10-CM

## 2018-07-15 DIAGNOSIS — M7989 Other specified soft tissue disorders: Secondary | ICD-10-CM | POA: Diagnosis not present

## 2018-07-15 DIAGNOSIS — Z87891 Personal history of nicotine dependence: Secondary | ICD-10-CM

## 2018-07-15 DIAGNOSIS — E874 Mixed disorder of acid-base balance: Secondary | ICD-10-CM | POA: Diagnosis present

## 2018-07-15 DIAGNOSIS — R0602 Shortness of breath: Secondary | ICD-10-CM | POA: Diagnosis not present

## 2018-07-15 DIAGNOSIS — R339 Retention of urine, unspecified: Secondary | ICD-10-CM | POA: Diagnosis present

## 2018-07-15 DIAGNOSIS — Z86711 Personal history of pulmonary embolism: Secondary | ICD-10-CM

## 2018-07-15 DIAGNOSIS — Z6836 Body mass index (BMI) 36.0-36.9, adult: Secondary | ICD-10-CM

## 2018-07-15 DIAGNOSIS — I959 Hypotension, unspecified: Secondary | ICD-10-CM | POA: Diagnosis present

## 2018-07-15 DIAGNOSIS — I50811 Acute right heart failure: Secondary | ICD-10-CM

## 2018-07-15 HISTORY — DX: Acute kidney failure, unspecified: N17.9

## 2018-07-15 HISTORY — DX: Heart failure, unspecified: I50.9

## 2018-07-15 LAB — COMPREHENSIVE METABOLIC PANEL
ALT: 12 U/L (ref 0–44)
AST: 19 U/L (ref 15–41)
Albumin: 3.5 g/dL (ref 3.5–5.0)
Alkaline Phosphatase: 53 U/L (ref 38–126)
Anion gap: 9 (ref 5–15)
BUN: 39 mg/dL — ABNORMAL HIGH (ref 8–23)
CO2: 31 mmol/L (ref 22–32)
Calcium: 8.4 mg/dL — ABNORMAL LOW (ref 8.9–10.3)
Chloride: 100 mmol/L (ref 98–111)
Creatinine, Ser: 2.97 mg/dL — ABNORMAL HIGH (ref 0.61–1.24)
GFR calc Af Amer: 23 mL/min — ABNORMAL LOW (ref >60)
GFR calc non Af Amer: 20 mL/min — ABNORMAL LOW (ref >60)
Glucose, Bld: 105 mg/dL — ABNORMAL HIGH (ref 70–99)
Potassium: 4.8 mmol/L (ref 3.5–5.1)
Sodium: 140 mmol/L (ref 135–145)
Total Bilirubin: 0.7 mg/dL (ref 0.3–1.2)
Total Protein: 6.3 g/dL — ABNORMAL LOW (ref 6.5–8.1)

## 2018-07-15 LAB — CBC WITH DIFFERENTIAL/PLATELET
Abs Immature Granulocytes: 0.07 10*3/uL (ref 0.00–0.07)
Basophils Absolute: 0 10*3/uL (ref 0.0–0.1)
Basophils Relative: 0 %
Eosinophils Absolute: 0.1 10*3/uL (ref 0.0–0.5)
Eosinophils Relative: 1 %
HCT: 36.8 % — ABNORMAL LOW (ref 39.0–52.0)
Hemoglobin: 10.6 g/dL — ABNORMAL LOW (ref 13.0–17.0)
Immature Granulocytes: 1 %
Lymphocytes Relative: 17 %
Lymphs Abs: 1.4 10*3/uL (ref 0.7–4.0)
MCH: 30.7 pg (ref 26.0–34.0)
MCHC: 28.8 g/dL — ABNORMAL LOW (ref 30.0–36.0)
MCV: 106.7 fL — ABNORMAL HIGH (ref 80.0–100.0)
Monocytes Absolute: 1 10*3/uL (ref 0.1–1.0)
Monocytes Relative: 12 %
Neutro Abs: 5.7 10*3/uL (ref 1.7–7.7)
Neutrophils Relative %: 69 %
Platelets: 160 10*3/uL (ref 150–400)
RBC: 3.45 MIL/uL — ABNORMAL LOW (ref 4.22–5.81)
RDW: 14.4 % (ref 11.5–15.5)
WBC: 8.2 10*3/uL (ref 4.0–10.5)
nRBC: 0.2 % (ref 0.0–0.2)

## 2018-07-15 LAB — TROPONIN I
Troponin I: 0.13 ng/mL (ref <0.03)
Troponin I: 0.14 ng/mL (ref <0.03)

## 2018-07-15 LAB — URINALYSIS, ROUTINE W REFLEX MICROSCOPIC
Bilirubin Urine: NEGATIVE
Glucose, UA: NEGATIVE mg/dL
Hgb urine dipstick: NEGATIVE
Ketones, ur: NEGATIVE mg/dL
Leukocytes,Ua: NEGATIVE
Nitrite: NEGATIVE
Protein, ur: NEGATIVE mg/dL
Specific Gravity, Urine: 1.013 (ref 1.005–1.030)
pH: 5 (ref 5.0–8.0)

## 2018-07-15 LAB — BRAIN NATRIURETIC PEPTIDE: B Natriuretic Peptide: 1038.4 pg/mL — ABNORMAL HIGH (ref 0.0–100.0)

## 2018-07-15 LAB — SODIUM, URINE, RANDOM: Sodium, Ur: 34 mmol/L

## 2018-07-15 LAB — CREATININE, URINE, RANDOM: Creatinine, Urine: 181.25 mg/dL

## 2018-07-15 MED ORDER — SODIUM CHLORIDE 0.9% FLUSH
3.0000 mL | Freq: Two times a day (BID) | INTRAVENOUS | Status: DC
  Administered 2018-07-15 – 2018-07-21 (×10): 3 mL via INTRAVENOUS

## 2018-07-15 MED ORDER — TAMSULOSIN HCL 0.4 MG PO CAPS
0.4000 mg | ORAL_CAPSULE | Freq: Every day | ORAL | Status: DC
  Administered 2018-07-16 – 2018-07-22 (×7): 0.4 mg via ORAL
  Filled 2018-07-15 (×7): qty 1

## 2018-07-15 MED ORDER — GABAPENTIN 100 MG PO CAPS
100.0000 mg | ORAL_CAPSULE | Freq: Three times a day (TID) | ORAL | Status: DC
  Administered 2018-07-15 – 2018-07-16 (×2): 100 mg via ORAL
  Filled 2018-07-15 (×2): qty 1

## 2018-07-15 MED ORDER — FUROSEMIDE 10 MG/ML IJ SOLN
20.0000 mg | Freq: Once | INTRAMUSCULAR | Status: DC
  Filled 2018-07-15: qty 2

## 2018-07-15 MED ORDER — FUROSEMIDE 10 MG/ML IJ SOLN
40.0000 mg | Freq: Two times a day (BID) | INTRAMUSCULAR | Status: DC
  Administered 2018-07-15: 21:00:00 40 mg via INTRAVENOUS
  Filled 2018-07-15: qty 4

## 2018-07-15 MED ORDER — ALLOPURINOL 100 MG PO TABS
100.0000 mg | ORAL_TABLET | Freq: Every day | ORAL | Status: DC
  Administered 2018-07-16 – 2018-07-22 (×7): 100 mg via ORAL
  Filled 2018-07-15 (×7): qty 1

## 2018-07-15 MED ORDER — RIVAROXABAN 15 MG PO TABS
15.0000 mg | ORAL_TABLET | Freq: Every day | ORAL | Status: DC
  Administered 2018-07-15: 15 mg via ORAL
  Filled 2018-07-15 (×2): qty 1

## 2018-07-15 NOTE — ED Notes (Signed)
Bladder Scan 83m

## 2018-07-15 NOTE — ED Provider Notes (Signed)
[facility] EMERGENCY DEPARTMENT Provider Note   CSN: [id number] Arrival date & time: 07/15/18  1224    History   Chief Complaint Chief Complaint  Patient presents with  . Weakness    HPI Ralph Powell is a 73 y.o. male.     HPI   Ralph Powell is a 73 y.o. male, with a history of COPD, DVT, PE, hyperlipidemia, HTN, obesity, renal carcinoma, A. fib, presenting to the ED sent by his PCP due to concern for renal failure, per patient. Patient notes he has had difficulty urinating and decreased urination over the last several days.  He has had increased shortness of breath over the last week or 2, as well as bilateral lower extremity edema increasing over the last month.  Additionally complains of intermittent twitching or "fluttering" in the extremities over the last month, much worse over last 3 days.   Denies chest pain, acute cough, fever/chills, N/V/C/D, abdominal pain, dysuria, hematuria, flank/back pain, syncope, lower extremity pain, or any other complaints.  Cardiologist: Dr. Jyl Heinz In chart review, it appears as though dronedarone was added to patient's regimen, perhaps following admission due to Afib RVR.   Past Medical History:  Diagnosis Date  . Arthritis   . COPD (chronic obstructive pulmonary disease) ([place])   . DVT, lower extremity ([place])   . Gout   . History of pulmonary embolus (PE)   . Hyperlipidemia   . Hypertension   . Obesity   . Obstructive sleep apnea   . Renal carcinoma [facility])     Patient Active Problem List   Diagnosis Date Noted  . History of pulmonary embolism 06/09/2018  . Paroxysmal atrial fibrillation ([place]) 06/09/2018  . History of DVT (deep vein thrombosis) 06/09/2018  . OSA (obstructive sleep apnea) 10/03/2016  . Other appendicitis 10/01/2016  . History of renal cell cancer 09/28/2016  . SVT (supraventricular tachycardia) ([place]) 09/25/2016  . Alcohol withdrawal delirium, acute, hyperactive ([place]) 09/24/2016  .  Acute pulmonary embolism ([place]) 09/23/2016  . Acute deep vein thrombosis (DVT) of both lower extremities ([place]) 09/22/2016  . Acute hyponatremia 09/21/2016  . Anemia 09/21/2016  . Bilateral leg edema 09/21/2016  . SOB (shortness of breath) 09/21/2016  . Abnormal liver function tests 04/26/2014  . Alcohol abuse 04/20/2013  . COPD (chronic obstructive pulmonary disease) ([place]) 04/20/2013  . Renal cell cancer ([place]) 04/20/2013  . Other specified disorders of kidney and ureter 03/11/2013    Past Surgical History:  Procedure Laterality Date  . NEPHRECTOMY Right         Home Medications    Prior to Admission medications   Medication Sig Start Date End Date Taking? Authorizing Provider  acetaminophen (TYLENOL) 325 MG tablet Take 650 mg by mouth every 4 (four) hours as needed.  10/01/16   [provider]  allopurinol (ZYLOPRIM) 100 MG tablet Take 1 tablet by mouth daily. 05/28/18   [provider]  bethanechol (URECHOLINE) 50 MG tablet Take by mouth 2 (two) times daily.     [provider]  dronedarone (MULTAQ) 400 MG tablet Take 1 tablet (400 mg total) by mouth 2 (two) times daily with a meal. 06/09/18   Revankar, Reita Cliche, MD  Ferrous Sulfate (IRON) 325 (65 Fe) MG TABS Take 1 tablet by mouth daily.    [provider]  gabapentin (NEURONTIN) 100 MG capsule Take 1 capsule by mouth 3 (three) times daily. 05/06/18   [provider]  metoprolol tartrate (LOPRESSOR) 25 MG tablet Take 0.5  tablets (12.5 mg total) by mouth 2 (two) times daily. 06/11/18   Revankar, Reita Cliche, MD  Misc Natural Products (TART CHERRY ADVANCED PO) Take 500 mg by mouth 3 (three) times daily.     [provider]  rivaroxaban (XARELTO) 20 MG TABS tablet Take 20 mg by mouth. 10/22/16   [provider]  sennosides-docusate sodium (SENOKOT-S) 8.6-50 MG tablet Take 1 tablet by mouth daily.    [provider]  tamsulosin (FLOMAX) 0.4 MG CAPS capsule Take 1 capsule by  mouth daily. 04/09/18   [provider]  torsemide (DEMADEX) 10 MG tablet Take 1 tablet (10 mg total) by mouth 2 (two) times daily. 07/13/18   Revankar, Reita Cliche, MD    Family History History reviewed. No pertinent family history.  Social History Social History   Tobacco Use  . Smoking status: Former Research scientist (life sciences)  . Smokeless tobacco: Never Used  Substance Use Topics  . Alcohol use: Not Currently    Frequency: Never  . Drug use: Not on file     Allergies   Oxycodone and Codeine   Review of Systems Review of Systems  Constitutional: Negative for chills and fever.  Respiratory: Positive for shortness of breath. Negative for cough.   Cardiovascular: Positive for leg swelling. Negative for chest pain and palpitations.  Gastrointestinal: Negative for abdominal pain, blood in stool, constipation, diarrhea, nausea and vomiting.  Genitourinary: Positive for decreased urine volume and difficulty urinating. Negative for dysuria, flank pain, hematuria, scrotal swelling and testicular pain.  Musculoskeletal: Negative for back pain.  Neurological: Negative for dizziness, syncope, weakness, light-headedness, numbness and headaches.  All other systems reviewed and are negative.    Physical Exam Updated Vital Signs BP (!) 144/78   Pulse (!) 45   Temp 97.6 F (36.4 C) (Oral)   Resp 13   Ht _0  (1.905 m)   SpO2 100%   BMI 35.25 kg/m   Physical Exam Vitals signs and nursing note reviewed.  Constitutional:      General: He is not in acute distress.    Appearance: He is well-developed. He is not diaphoretic.  HENT:     Head: Normocephalic and atraumatic.     Mouth/Throat:     Mouth: Mucous membranes are moist.     Pharynx: Oropharynx is clear.  Eyes:     Conjunctiva/sclera: Conjunctivae normal.  Neck:     Musculoskeletal: Neck supple.  Cardiovascular:     Rate and Rhythm: Regular rhythm. Bradycardia present.     Pulses: Normal pulses.     Heart sounds: Normal heart  sounds.     Comments: Tactile temperature in the extremities appropriate and equal bilaterally. Pulmonary:     Effort: Pulmonary effort is normal. No respiratory distress.     Breath sounds: Normal breath sounds.     Comments: SPO2 100% on home O2 level of 3 L Abdominal:     Palpations: Abdomen is soft.     Tenderness: There is no abdominal tenderness. There is no guarding.  Musculoskeletal:     Right lower leg: Edema present.     Left lower leg: Edema present.  Lymphadenopathy:     Cervical: No cervical adenopathy.  Skin:    General: Skin is warm and dry.  Neurological:     Mental Status: He is alert.  Psychiatric:        Mood and Affect: Mood and affect normal.        Speech: Speech normal.  Behavior: Behavior normal.      ED Treatments / Results  Labs (all labs ordered are listed, but only abnormal results are displayed) Labs Reviewed  COMPREHENSIVE METABOLIC PANEL - Abnormal; Notable for the following components:      Result Value   Glucose, Bld 105 (*)    BUN 39 (*)    Creatinine, Ser 2.97 (*)    Calcium 8.4 (*)    Total Protein 6.3 (*)    GFR calc non Af Amer 20 (*)    GFR calc Af Amer 23 (*)    All other components within normal limits  BRAIN NATRIURETIC PEPTIDE - Abnormal; Notable for the following components:   B Natriuretic Peptide 1,038.4 (*)    All other components within normal limits  TROPONIN I - Abnormal; Notable for the following components:   Troponin I 0.13 (*)    All other components within normal limits  CBC WITH DIFFERENTIAL/PLATELET - Abnormal; Notable for the following components:   RBC 3.45 (*)    Hemoglobin 10.6 (*)    HCT 36.8 (*)    MCV 106.7 (*)    MCHC 28.8 (*)    All other components within normal limits  URINALYSIS, ROUTINE W REFLEX MICROSCOPIC    EKG EKG Interpretation  Date/Time:  Wednesday July 15 2018 13:52:34 EDT Ventricular Rate:  43 PR Interval:    QRS Duration: 155 QT Interval:  562 QTC Calculation: 476 R  Axis:   -22 Text Interpretation:  Sinus bradycardia Borderline prolonged PR interval Right bundle branch block No previous ECGs available Confirmed by Fredia Sorrow [phone]) on 07/15/2018 2:16:42 PM   Radiology Dg Chest Portable 1 View  Result Date: 07/15/2018 CLINICAL DATA:  Shortness of breath and tremors for 1 day. History of COPD. EXAM: PORTABLE CHEST 1 VIEW COMPARISON:  07/13/2018 FINDINGS: The cardiomediastinal silhouette is unchanged with the left heart border remaining partially obscured. Small to moderate left and small right pleural effusions and left greater than right basilar lung opacities are unchanged. No edema or pneumothorax is identified. No acute osseous abnormality is seen. IMPRESSION: Unchanged left larger than right pleural effusions and basilar lung opacities, at least in part atelectasis though pneumonia is also possible in the left lung base. Electronically Signed   By: Logan Bores M.D.   On: 07/15/2018 14:09    Procedures Procedures (including critical care time)  Medications Ordered in ED Medications  furosemide (LASIX) injection 20 mg (has no administration in time range)     Initial Impression / Assessment and Plan / ED Course  I have reviewed the triage vital signs and the nursing notes.  Pertinent labs & imaging results that were available during my care of the patient were reviewed by me and considered in my medical decision making (see chart for details).  Clinical Course as of Jul 15 1619  Wed Jul 15, 2018  1430 EKG reviewed by myself and Dr. Rogene Houston.  No EKG with which to compare in MUSE, however, we were able to review an EKG in the system from June 09, 2018.  That EKG showed sinus bradycardia at a rate of 50 without evidence of RBBB.  ED EKG [SJ]  1534 Spoke with Jenny Reichmann, ED pharmacist, for recommendations on IV lasix equivalent to patient's home torsemide.    [SJ]  [place] with Trish, Water engineer. They will come see the patient.   [SJ]  3734  Patient was straining to urinate just prior to this pressure. I was asked to come  reassess the patient by RN. Patient alert and oriented. Denies CP, SOB, dizziness. No change noted in patient appearance. BP recovered after several minutes.   BP(!): 70/48 [SJ]  1619 Spoke with Dr. Reesa Chew, internal medicine resident. Agrees to admit the patient.    [SJ]    Clinical Course User Index [SJ] ,  C, PA-C       Patient presents at the request of his PCP due to concern for worsening kidney function.  He has had difficulty urinating and decreased urination over the last several days.  Patient does show signs of fluid overload with lower extremity edema increasing over the last month, shortness of breath increasing over the last week or two, and pleural effusions noted on chest x-ray. Creatinine 2.97, increased from 2.09 five days ago.  He was noted to be bradycardic.  Though he has been noted to be bradycardic in the past, his EKG today shows a seemingly new RBBB. There is suspicion for onset of heart failure.  Troponin 0.13 with BNP of 1038.  As of latest echo from 2018, found in [place], EF was estimated to be 55%. Cardiology consulted and will come see the patient. Patient admitted for further management.   Findings and plan of care discussed with Fredia Sorrow, MD. Dr. Rogene Houston personally evaluated and examined this patient.    Vitals:   07/15/18 1341 07/15/18 1345 07/15/18 1400 07/15/18 1415  BP:    (!) 124/101  Pulse: 67 (!) 41 (!) 42 (!) 43  Resp: _0 Temp:      TempSrc:      SpO2: 93% 93% 94% 95%  Height:       Vitals:   07/15/18 1530 07/15/18 1532 07/15/18 1600 07/15/18 1611  BP: 106/74  (!) 70/48 (!) 117/57  Pulse: (!) 43 (!) 43 (!) 44 (!) 41  Resp: 18 17 (!) 21 19  Temp:      TempSrc:      SpO2: 96% 100% 94% 94%  Height:         Final Clinical Impressions(s) / ED Diagnoses   Final diagnoses:  Other hypervolemia  Acute renal failure,  unspecified acute renal failure type [facility])    ED Discharge Orders    None       Layla Maw 07/15/18 1624    Fredia Sorrow, MD 07/18/18 1352

## 2018-07-15 NOTE — Telephone Encounter (Signed)
RN followed up with patients wife.Patient only urinated twice yesterday. They saw Dr.Van Eyk this morning and he immediately sent him to [facility]. Patient has been admitted. No further questions at this time.

## 2018-07-15 NOTE — ED Notes (Signed)
ED TO INPATIENT HANDOFF REPORT  ED Nurse Name and Phone #: [phone]  S Name/Age/Gender Ralph Powell 73 y.o. male Room/Bed: 020C/020C  Code Status   Code Status: Full Code  Home/SNF/Other Home Patient oriented to: self, place and situation Is this baseline? Yes   Triage Complete: Triage complete  Chief Complaint Weakness  Triage Note Pt here from his MD office with c/o increased kidney function and a tremor that has got worse over the last month , p   Allergies Allergies  Allergen Reactions  . Oxycodone   . Codeine Itching    Level of Care/Admitting Diagnosis ED Disposition    ED Disposition Condition [facility]: [place] [[id number]]  Level of Care: Telemetry Cardiac [103]  Diagnosis: Heart failure [facility]) [[id number]]  Admitting Physician: Oda Kilts [[id number]]  Attending Physician: Oda Kilts [[id number]]  Estimated length of stay: past midnight tomorrow  Certification:: I certify this patient will need inpatient services for at least 2 midnights  Possible Covid Disease Patient Isolation: Low Risk  (Less than 4L [place] supplementation)  PT Class (Do Not Modify): Inpatient [101]  PT Acc Code (Do Not Modify): Private [1]       B Medical/Surgery History Past Medical History:  Diagnosis Date  . Arthritis   . COPD (chronic obstructive pulmonary disease) ([place])   . DVT, lower extremity ([place])   . Gout   . History of pulmonary embolus (PE)   . Hyperlipidemia   . Hypertension   . Obesity   . Obstructive sleep apnea   . Renal carcinoma [facility])    Past Surgical History:  Procedure Laterality Date  . NEPHRECTOMY Right      A IV Location/Drains/Wounds Patient Lines/Drains/Airways Status   Active Line/Drains/Airways    Name:   Placement date:   Placement time:   Site:   Days:   Peripheral IV 07/15/18 Left Antecubital   07/15/18    1422    Antecubital   less than 1          Intake/Output Last 24 hours No intake  or output data in the 24 hours ending 07/15/18 1756  Labs/Imaging Results for orders placed or performed during the hospital encounter of 07/15/18 (from the past 48 hour(s))  Comprehensive metabolic panel     Status: Abnormal   Collection Time: 07/15/18  2:20 PM  Result Value Ref Range   Sodium 140 135 - 145 mmol/L   Potassium 4.8 3.5 - 5.1 mmol/L   Chloride 100 98 - 111 mmol/L   CO2 31 22 - 32 mmol/L   Glucose, Bld 105 (H) 70 - 99 mg/dL   BUN 39 (H) 8 - 23 mg/dL   Creatinine, Ser 2.97 (H) 0.61 - 1.24 mg/dL   Calcium 8.4 (L) 8.9 - 10.3 mg/dL   Total Protein 6.3 (L) 6.5 - 8.1 g/dL   Albumin 3.5 3.5 - 5.0 g/dL   AST 19 15 - 41 U/L   ALT 12 0 - 44 U/L   Alkaline Phosphatase 53 38 - 126 U/L   Total Bilirubin 0.7 0.3 - 1.2 mg/dL   GFR calc non Af Amer 20 (L) >60 mL/min   GFR calc Af Amer 23 (L) >60 mL/min   Anion gap 9 5 - 15    Comment: Performed at [facility], [address] [place], [place] [address]  Brain natriuretic peptide     Status: Abnormal   Collection Time: 07/15/18  2:20 PM  Result Value Ref Range   B Natriuretic Peptide 1,038.4 (H) 0.0 - 100.0 pg/mL    Comment: Performed at [facility] [address] [place], [place] [address]  Troponin I - Once     Status: Abnormal   Collection Time: 07/15/18  2:20 PM  Result Value Ref Range   Troponin I 0.13 (HH) <0.03 ng/mL    Comment: CRITICAL RESULT CALLED TO, READ BACK BY AND VERIFIED WITH: A GORTON,RN 1522 07/15/2018 D BRADLEY Performed at [facility], [place] [address] [place], [place] [address]   CBC with Differential     Status: Abnormal   Collection Time: 07/15/18  2:20 PM  Result Value Ref Range   WBC 8.2 4.0 - 10.5 K/uL   RBC 3.45 (L) 4.22 - 5.81 MIL/uL   Hemoglobin 10.6 (L) 13.0 - 17.0 g/dL   HCT 36.8 (L) 39.0 - 52.0 %   MCV 106.7 (H) 80.0 - 100.0 fL   MCH 30.7 26.0 - 34.0 pg   MCHC 28.8 (L) 30.0 - 36.0 g/dL   RDW 14.4 11.5 - 15.5 %   Platelets 160 150 - 400 K/uL   nRBC 0.2 0.0 - 0.2 %    Neutrophils Relative % 69 %   Neutro Abs 5.7 1.7 - 7.7 K/uL   Lymphocytes Relative 17 %   Lymphs Abs 1.4 0.7 - 4.0 K/uL   Monocytes Relative 12 %   Monocytes Absolute 1.0 0.1 - 1.0 K/uL   Eosinophils Relative 1 %   Eosinophils Absolute 0.1 0.0 - 0.5 K/uL   Basophils Relative 0 %   Basophils Absolute 0.0 0.0 - 0.1 K/uL   Immature Granulocytes 1 %   Abs Immature Granulocytes 0.07 0.00 - 0.07 K/uL    Comment: Performed at [facility], [place] [address], [place], [place] [address]  Urinalysis, Routine w reflex microscopic     Status: None   Collection Time: 07/15/18  4:00 PM  Result Value Ref Range   Color, Urine YELLOW YELLOW   APPearance CLEAR CLEAR   Specific Gravity, Urine 1.013 1.005 - 1.030   pH 5.0 5.0 - 8.0   Glucose, UA NEGATIVE NEGATIVE mg/dL   Hgb urine dipstick NEGATIVE NEGATIVE   Bilirubin Urine NEGATIVE NEGATIVE   Ketones, ur NEGATIVE NEGATIVE mg/dL   Protein, ur NEGATIVE NEGATIVE mg/dL   Nitrite NEGATIVE NEGATIVE   Leukocytes,Ua NEGATIVE NEGATIVE    Comment: Performed at [facility] [address], [place], [place] [address]   Dg Chest Portable 1 View  Result Date: 07/15/2018 CLINICAL DATA:  Shortness of breath and tremors for 1 day. History of COPD. EXAM: PORTABLE CHEST 1 VIEW COMPARISON:  07/13/2018 FINDINGS: The cardiomediastinal silhouette is unchanged with the left heart border remaining partially obscured. Small to moderate left and small right pleural effusions and left greater than right basilar lung opacities are unchanged. No edema or pneumothorax is identified. No acute osseous abnormality is seen. IMPRESSION: Unchanged left larger than right pleural effusions and basilar lung opacities, at least in part atelectasis though pneumonia is also possible in the left lung base. Electronically Signed   By: Logan Bores M.D.   On: 07/15/2018 14:09    Pending Labs Unresulted Labs (From admission, onward)    Start     Ordered   07/16/18 9030  Basic  metabolic panel  Daily,   R     07/15/18 1722   07/15/18 1724  Sodium, urine, random  Once,   R  07/15/18 1723   07/15/18 1724  Creatinine, urine, random  Once,   R     07/15/18 1723   07/15/18 1716  Troponin I - Now Then Q6H  Now then every 6 hours,   R     07/15/18 1722          Vitals/Pain Today's Vitals   07/15/18 1600 07/15/18 1611 07/15/18 1615 07/15/18 1630  BP: (!) 70/48 (!) 117/57 114/61 104/61  Pulse: (!) 44 (!) 41 (!) 42 (!) 42  Resp: (!) _0 Temp:      TempSrc:      SpO2: 94% 94% 97% 93%  Height:      PainSc:        Isolation Precautions No active isolations  Medications Medications  furosemide (LASIX) injection 20 mg (has no administration in time range)  sodium chloride flush (NS) 0.9 % injection 3 mL (has no administration in time range)    Mobility walks with device High fall risk   Focused Assessments Cardiac Assessment Handoff:  Cardiac Rhythm: Sinus bradycardia Lab Results  Component Value Date   TROPONINI 0.13 ([place]) 07/15/2018   No results found for: DDIMER Does the Patient currently have chest pain? No     R Recommendations: See Admitting Provider Note  Report given to:   Additional Notes: .

## 2018-07-15 NOTE — ED Triage Notes (Signed)
Pt here from his MD office with c/o increased kidney function and a tremor that has got worse over the last month , p

## 2018-07-15 NOTE — ED Notes (Signed)
Pt urine volume 44m

## 2018-07-15 NOTE — Progress Notes (Signed)
PIV consult: Cancel consult. IV started by ED RN.

## 2018-07-15 NOTE — Telephone Encounter (Signed)
Pl call her later in the day to check in on her.

## 2018-07-15 NOTE — H&P (Addendum)
Date: 07/15/2018               Patient Name:  Ralph Powell MRN: [id number]  DOB: 1945-07-03 Age / Sex: 73 y.o., male   PCP: Townsend Roger, MD         Medical Service: Internal Medicine Teaching Service         Attending Physician: Dr. Rebeca Alert Raynaldo Opitz, MD    First Contact: Dr. Laural Golden Pager: [phone]  Second Contact: Dr. Reesa Chew Pager: [phone]       After Hours (After 5p/  First Contact Pager: [phone]  weekends / holidays): Second Contact Pager: [phone]   Chief Complaint: Confusion, decreased urinary output   History of Present Illness: Ralph Powell is a 73 y.o male with a history of DVT, PE, COPD on 3L supplemental oxygen, HTN, obseity and renal cell carcinoma with a right nephrectomy presenting to the ED due to confusion and decreased urinary output. He was sent to the hospital per his PCP. Most of the history is per chart review. Patient was alert and oriented but confused during the interview.   Patient told the ED provider that he has had difficulty urinating and decreased output over the last several days.  He is also noticed worsening shortness of breath over the last couple weeks and increased lower extremity edema over the past month.  He states that he has been having trouble laying flat as well for the last couple of months.  Also complains of intermittent twitching in his extremities for the past month.  He states he is having bilateral knee pain which is chronic.  Denies any chest pain, trouble breathing, cough, fever, nausea, vomiting or abdominal pain.  Denies any pain with urination or blood in his urine.  He has some pain in his lower extremities.  States at baseline he is able to walk 30 to 40 feet.  States that his wife helps him manage his medications at home.  He was unable to confirm what medications he is taking or whether he took any medications today.  Per chart review, a recent phone call with his wife and an Therapist, sports at [facility] at [place] on 4/13, she  was concerned because her husband was not sleeping and hallucinating. She also states he as having very little urine output and unable to follow commands. He is trembling so much he was unable to feed himself or hold anything in his hands. Wife reports he has gained ~25lbs since starting dronedarone on 06/09/2018. He was recently switched from furosemide to torsemide 10 mg bid. Wife was advised to stop dronedarone until he was able to seek medical help.   ED course: On arrival, he was bradycardic with some hypotension.  Urinalysis was unremarkable.  CMP showed a BUN of 39 creatinine of 2.97.  BNP elevated at 1038.  Troponin 0.13.  Chest x-ray showed an unchanged left larger than right pleural effusion and basilar lung opacities.  EKG showed a new right bundle branch block.  Meds:  No current facility-administered medications on file prior to encounter.    Current Outpatient Medications on File Prior to Encounter  Medication Sig Dispense Refill  . acetaminophen (TYLENOL) 325 MG tablet Take 650 mg by mouth every 4 (four) hours as needed.     Marland Kitchen allopurinol (ZYLOPRIM) 100 MG tablet Take 1 tablet by mouth daily.    . bethanechol (URECHOLINE) 50 MG tablet Take by mouth 2 (two) times daily.     Marland Kitchen dronedarone (  MULTAQ) 400 MG tablet Take 1 tablet (400 mg total) by mouth 2 (two) times daily with a meal. 60 tablet 2  . Ferrous Sulfate (IRON) 325 (65 Fe) MG TABS Take 1 tablet by mouth daily.    Marland Kitchen gabapentin (NEURONTIN) 100 MG capsule Take 1 capsule by mouth 3 (three) times daily.    . metoprolol tartrate (LOPRESSOR) 25 MG tablet Take 0.5 tablets (12.5 mg total) by mouth 2 (two) times daily. 30 tablet 3  . Misc Natural Products (TART CHERRY ADVANCED PO) Take 500 mg by mouth 3 (three) times daily.     . rivaroxaban (XARELTO) 20 MG TABS tablet Take 20 mg by mouth.    . sennosides-docusate sodium (SENOKOT-S) 8.6-50 MG tablet Take 1 tablet by mouth daily.    . tamsulosin (FLOMAX) 0.4 MG CAPS capsule Take 1 capsule  by mouth daily.    Marland Kitchen torsemide (DEMADEX) 10 MG tablet Take 1 tablet (10 mg total) by mouth 2 (two) times daily. 60 tablet 2   No outpatient medications have been marked as taking for the 07/15/18 encounter [facility] Encounter).     Allergies: Allergies as of 07/15/2018 - Review Complete 07/15/2018  Allergen Reaction Noted  . Oxycodone  09/21/2016  . Codeine Itching 04/29/2013   Past Medical History:  Diagnosis Date  . Arthritis   . COPD (chronic obstructive pulmonary disease) ([place])   . DVT, lower extremity ([place])   . Gout   . History of pulmonary embolus (PE)   . Hyperlipidemia   . Hypertension   . Obesity   . Obstructive sleep apnea   . Renal carcinoma ([place])     Family History:  History reviewed. No pertinent family history.  Social History: Has a history of smoking, quit a few years ago. Has a history of alcohol abuse.  Social History   Socioeconomic History  . Marital status: Married    Spouse name: Not on file  . Number of children: Not on file  . Years of education: Not on file  . Highest education level: Not on file  Occupational History  . Not on file  Social Needs  . Financial resource strain: Not on file  . Food insecurity:    Worry: Not on file    Inability: Not on file  . Transportation needs:    Medical: Not on file    Non-medical: Not on file  Tobacco Use  . Smoking status: Former Research scientist (life sciences)  . Smokeless tobacco: Never Used  Substance and Sexual Activity  . Alcohol use: Not Currently    Frequency: Never  . Drug use: Not on file  . Sexual activity: Not on file  Lifestyle  . Physical activity:    Days per week: Not on file    Minutes per session: Not on file  . Stress: Not on file  Relationships  . Social connections:    Talks on phone: Not on file    Gets together: Not on file    Attends religious service: Not on file    Active member of club or organization: Not on file    Attends meetings of clubs or organizations: Not on file     Relationship status: Not on file  . Intimate partner violence:    Fear of current or ex partner: Not on file    Emotionally abused: Not on file    Physically abused: Not on file    Forced sexual activity: Not on file  Other Topics Concern  . Not on file  Social History Narrative  . Not on file    Review of Systems: A complete ROS was negative except as per HPI.   Physical Exam: Blood pressure (!) 97/59, pulse (!) 44, temperature 97.8 F (36.6 C), temperature source Oral, resp. rate 19, height _0  (1.905 m), SpO2 95 %.  Physical Exam  Constitutional: He is oriented to person, place, and time and well-developed, well-nourished, and in no distress.  tremulous   Neck: No JVD present.  Cardiovascular: Regular rhythm and normal heart sounds. Bradycardia present.  No murmur heard. Pulmonary/Chest: Effort normal and breath sounds normal. No respiratory distress. He has no wheezes. He has no rales.  Abdominal: Soft. Bowel sounds are normal. He exhibits no distension. There is no abdominal tenderness.  Musculoskeletal:        General: Edema present. No tenderness.     Comments: Bilateral LE 2+ pitting edema   Neurological: He is alert and oriented to person, place, and time.  Skin: Skin is warm and dry.    EKG: personally reviewed my interpretation is sinus bradycardia, borderline prolonged PR interval, RBBB  CXR: personally reviewed my interpretation is bilateral pleural effusions, basilar lung opacities   Assessment & Plan by Problem: Active Problems:   Heart failure (HCC)   AKI (acute kidney injury) [facility])  Ralph Powell is a 73 y.o male with a history of paroxysmal atrial fibrillation, DVT, PE, COPD on 3L supplemental oxygen, HTN, obseity and history of renal cell carcinoma with a right nephrectomy presenting to the ED due to confusion and decreased urinary output. He was sent to the hospital per his PCP. Most of the history is per chart review. Patient was alert and oriented  but confused during the interview. Attempted calling the wife, no answer.   Congestive Heart Failure: Per chart review, echo in 08/2016 showed EF 50-55%, no regional wall abnormalities and normal diastolic function. He presented with decreased urinary output, dyspnea, and bilateral LE edema. BNP is 1038. Troponin 0.13. EKG shows new RBBB. No JVD appreciated. His weight increased by about 20lbs from March 10th to April from outpatient notes. Discussed with Dr. Marlou Porch, clinical picture is suggestive of CHF and he recommends to diurese with IV furosemide 40 mg BID.  - Consulted cardiology, appreciate recommendations  - IV furosemide 40 mg BID  - Cardiac monitoring  - Trend troponin - Echocardiogram - Daily weights  - Strict ins & outs    AKI: Cr 2.97 on admission. BUN/Cr ratio 13. Reportedly having decreased urine output. Bladder scan showed 36 ml. UA unremarkable, no protein to suggest nephrotic syndrome. Plan is to diurese with IV furosemide. Will consider nephrology consult if he does not have good urinary response to IV diuretics.  - Daily BMP - Avoid nephrotoxic agents  - Renal US   Hallucinations: Unclear etiology and limited history. Per chart review this has been going on for the past few days. No signs or symptoms of infection. Afebrile no leukocytosis. Will continue to monitor.   Paroxysmal Atrial Fibrillation: Currently in sinus rhythm. Home medications metoprolol and xarelto. Underwent cardioversion in the past. Cardiology recently stopped his amiodarone and started him on dronedarone on 06/09/2018. Hold metoprolol due to bradycardia.  - Renally dosed xarelto 15 mg  - Hold dronedarone and metoprolol  COPD: on 3L supplemental oxygen at home. Stable.  Gout: continue allopurinol  OSA: not on any sleep aids   Diet: Heart healthy  VTE ppx: Xarelto Full Code  Dispo: Admit patient to Inpatient with expected  length of stay greater than 2 midnights.  SignedMike Craze, DO  07/15/2018, 7:33 PM  Pager: [phone]

## 2018-07-15 NOTE — Progress Notes (Signed)
Pt admitted to room 3E18 from ED d/t increased weakness and low urine output. Pt alert and oriented X 2. Bed alarm on. Assessment completed.

## 2018-07-15 NOTE — ED Notes (Addendum)
PT's wife-Pat called  [phone] to give update

## 2018-07-15 NOTE — ED Notes (Signed)
Difficulty starting IV IV team consulted

## 2018-07-16 ENCOUNTER — Inpatient Hospital Stay (HOSPITAL_COMMUNITY): Payer: Medicare Other

## 2018-07-16 DIAGNOSIS — I4510 Unspecified right bundle-branch block: Secondary | ICD-10-CM

## 2018-07-16 DIAGNOSIS — G9340 Encephalopathy, unspecified: Secondary | ICD-10-CM | POA: Diagnosis present

## 2018-07-16 DIAGNOSIS — Z7901 Long term (current) use of anticoagulants: Secondary | ICD-10-CM

## 2018-07-16 DIAGNOSIS — Z86718 Personal history of other venous thrombosis and embolism: Secondary | ICD-10-CM

## 2018-07-16 DIAGNOSIS — I110 Hypertensive heart disease with heart failure: Secondary | ICD-10-CM

## 2018-07-16 DIAGNOSIS — Z9981 Dependence on supplemental oxygen: Secondary | ICD-10-CM

## 2018-07-16 DIAGNOSIS — Z79899 Other long term (current) drug therapy: Secondary | ICD-10-CM

## 2018-07-16 DIAGNOSIS — Z86711 Personal history of pulmonary embolism: Secondary | ICD-10-CM

## 2018-07-16 DIAGNOSIS — I2609 Other pulmonary embolism with acute cor pulmonale: Secondary | ICD-10-CM | POA: Diagnosis present

## 2018-07-16 DIAGNOSIS — Z885 Allergy status to narcotic agent status: Secondary | ICD-10-CM

## 2018-07-16 DIAGNOSIS — Z85520 Personal history of malignant carcinoid tumor of kidney: Secondary | ICD-10-CM

## 2018-07-16 DIAGNOSIS — I5033 Acute on chronic diastolic (congestive) heart failure: Secondary | ICD-10-CM | POA: Diagnosis present

## 2018-07-16 DIAGNOSIS — Z905 Acquired absence of kidney: Secondary | ICD-10-CM

## 2018-07-16 DIAGNOSIS — I361 Nonrheumatic tricuspid (valve) insufficiency: Secondary | ICD-10-CM

## 2018-07-16 DIAGNOSIS — J449 Chronic obstructive pulmonary disease, unspecified: Secondary | ICD-10-CM

## 2018-07-16 DIAGNOSIS — I509 Heart failure, unspecified: Secondary | ICD-10-CM

## 2018-07-16 DIAGNOSIS — Z87891 Personal history of nicotine dependence: Secondary | ICD-10-CM

## 2018-07-16 DIAGNOSIS — N179 Acute kidney failure, unspecified: Secondary | ICD-10-CM

## 2018-07-16 DIAGNOSIS — F1011 Alcohol abuse, in remission: Secondary | ICD-10-CM

## 2018-07-16 DIAGNOSIS — M109 Gout, unspecified: Secondary | ICD-10-CM

## 2018-07-16 DIAGNOSIS — I480 Paroxysmal atrial fibrillation: Secondary | ICD-10-CM

## 2018-07-16 DIAGNOSIS — I340 Nonrheumatic mitral (valve) insufficiency: Secondary | ICD-10-CM

## 2018-07-16 DIAGNOSIS — R443 Hallucinations, unspecified: Secondary | ICD-10-CM

## 2018-07-16 DIAGNOSIS — G4733 Obstructive sleep apnea (adult) (pediatric): Secondary | ICD-10-CM

## 2018-07-16 HISTORY — DX: Encephalopathy, unspecified: G93.40

## 2018-07-16 LAB — BLOOD GAS, ARTERIAL
Acid-Base Excess: 5 mmol/L — ABNORMAL HIGH (ref 0.0–2.0)
Acid-Base Excess: 6.7 mmol/L — ABNORMAL HIGH (ref 0.0–2.0)
Bicarbonate: 32.2 mmol/L — ABNORMAL HIGH (ref 20.0–28.0)
Bicarbonate: 33.9 mmol/L — ABNORMAL HIGH (ref 20.0–28.0)
Delivery systems: POSITIVE
Drawn by: 27011
Drawn by: 51903
Expiratory PAP: 8
FIO2: 60
Inspiratory PAP: 18
O2 Content: 4 L/min
O2 Saturation: 88.3 %
O2 Saturation: 93.1 %
Patient temperature: 98.6
Patient temperature: 98.5
RATE: 8 resp/min
pCO2 arterial: 81.1 mmHg (ref 32.0–48.0)
pCO2 arterial: 83.3 mmHg (ref 32.0–48.0)
pH, Arterial: 7.223 — ABNORMAL LOW (ref 7.350–7.450)
pH, Arterial: 7.233 — ABNORMAL LOW (ref 7.350–7.450)
pO2, Arterial: 61.2 mmHg — ABNORMAL LOW (ref 83.0–108.0)
pO2, Arterial: 74 mmHg — ABNORMAL LOW (ref 83.0–108.0)

## 2018-07-16 LAB — TROPONIN I
Troponin I: 0.12 ng/mL (ref <0.03)
Troponin I: 0.09 ng/mL (ref <0.03)

## 2018-07-16 LAB — URINALYSIS, ROUTINE W REFLEX MICROSCOPIC
Bilirubin Urine: NEGATIVE
Glucose, UA: NEGATIVE mg/dL
Ketones, ur: NEGATIVE mg/dL
Leukocytes,Ua: NEGATIVE
Nitrite: NEGATIVE
Protein, ur: NEGATIVE mg/dL
Specific Gravity, Urine: 1.006 (ref 1.005–1.030)
pH: 5 (ref 5.0–8.0)

## 2018-07-16 LAB — BASIC METABOLIC PANEL
Anion gap: 11 (ref 5–15)
BUN: 42 mg/dL — ABNORMAL HIGH (ref 8–23)
CO2: 29 mmol/L (ref 22–32)
Calcium: 8.2 mg/dL — ABNORMAL LOW (ref 8.9–10.3)
Chloride: 99 mmol/L (ref 98–111)
Creatinine, Ser: 3.02 mg/dL — ABNORMAL HIGH (ref 0.61–1.24)
GFR calc Af Amer: 23 mL/min — ABNORMAL LOW (ref >60)
GFR calc non Af Amer: 20 mL/min — ABNORMAL LOW (ref >60)
Glucose, Bld: 94 mg/dL (ref 70–99)
Potassium: 4.9 mmol/L (ref 3.5–5.1)
Sodium: 139 mmol/L (ref 135–145)

## 2018-07-16 LAB — ECHOCARDIOGRAM COMPLETE
Height: 75 in
Weight: 4747.83 oz

## 2018-07-16 LAB — MAGNESIUM: Magnesium: 2.3 mg/dL (ref 1.7–2.4)

## 2018-07-16 LAB — PHOSPHORUS: Phosphorus: 5.8 mg/dL — ABNORMAL HIGH (ref 2.5–4.6)

## 2018-07-16 MED ORDER — FUROSEMIDE 10 MG/ML IJ SOLN
80.0000 mg | Freq: Two times a day (BID) | INTRAMUSCULAR | Status: DC
  Administered 2018-07-16: 80 mg via INTRAVENOUS
  Filled 2018-07-16: qty 8

## 2018-07-16 MED ORDER — HALOPERIDOL LACTATE 5 MG/ML IJ SOLN
5.0000 mg | Freq: Four times a day (QID) | INTRAMUSCULAR | Status: DC | PRN
  Administered 2018-07-21: 21:00:00 5 mg via INTRAVENOUS
  Filled 2018-07-16: qty 1

## 2018-07-16 MED ORDER — FUROSEMIDE 10 MG/ML IJ SOLN
160.0000 mg | Freq: Once | INTRAVENOUS | Status: AC
  Administered 2018-07-16: 160 mg via INTRAVENOUS
  Filled 2018-07-16: qty 10

## 2018-07-16 MED ORDER — HALOPERIDOL LACTATE 5 MG/ML IJ SOLN
INTRAMUSCULAR | Status: AC
  Administered 2018-07-16: 16:00:00 5 mg
  Filled 2018-07-16: qty 1

## 2018-07-16 MED ORDER — HEPARIN (PORCINE) 25000 UT/250ML-% IV SOLN
1700.0000 [IU]/h | INTRAVENOUS | Status: DC
  Administered 2018-07-16: 1500 [IU]/h via INTRAVENOUS
  Administered 2018-07-17 – 2018-07-20 (×6): 1700 [IU]/h via INTRAVENOUS
  Filled 2018-07-16 (×7): qty 250

## 2018-07-16 MED ORDER — FUROSEMIDE 10 MG/ML IJ SOLN
60.0000 mg | Freq: Two times a day (BID) | INTRAMUSCULAR | Status: AC
  Administered 2018-07-16 – 2018-07-17 (×3): 60 mg via INTRAVENOUS
  Filled 2018-07-16 (×3): qty 6

## 2018-07-16 MED ORDER — VITAMIN B-1 100 MG PO TABS
100.0000 mg | ORAL_TABLET | Freq: Every day | ORAL | Status: DC
  Administered 2018-07-17 – 2018-07-22 (×6): 100 mg via ORAL
  Filled 2018-07-16 (×6): qty 1

## 2018-07-16 NOTE — Progress Notes (Signed)
  Echocardiogram 2D Echocardiogram has been performed.  Ralph Powell 07/16/2018, 9:13 AM

## 2018-07-16 NOTE — Progress Notes (Signed)
RT NOTE: RT received call from RT who brought patient to [facility]. RT placed patient on bipap before I arrived. Settings are as follows 18/8 60%. Patient seems to be tolerating well and patient getting tidal volumes between 500-600. Patient states that his breathing feels okay on bipap. Patient breath sounds diminished and sating 100%. RT will continue to monitor.

## 2018-07-16 NOTE — Consult Note (Addendum)
[facility] Nephrology Consultation Note  Requesting MD: Lenice Pressman MD Reason for consult: AKI, Anuria  HPI:  Ralph Powell is a 73 y.o. male with history of hypertension, hyperlipidemia, obesity, OSA, gout, COPD on 3 L of oxygen at home, DVT, PE on anticoagulation, renal cell carcinoma status post right nephrectomy, paroxysmal atrial fibrillation, presented to the ER with decreasing urine output associated with confusion.  In the ER patient was found to have lower extremity edema, dyspnea with elevated BNP of 1038.  Mild elevation in troponin noted.  Patient was treated with IV Lasix with no significant urine output.  The serum creatinine level was 2.09 on admission which was increased to 3.02 today.  The baseline serum creatinine level unknown.  UA unremarkable with no protein.  Ultrasound of kidney consistent with normal left kidney and prior right nephrectomy.  Chest x-ray with pleural effusion and possible pneumonia.  No acute pulmonary edema or pneumothorax and chest x-ray.  We are consulted for the evaluation of elevated creatinine with decreased urine output. Patient's home medications includes Lasix 40, metoprolol, gabapentin, allopurinol, iron and tamsulosin.  Patient had systolic blood pressure 81E yesterday afternoon.  During evaluation, patient was alert awake and following simple commands only.  He is confused therefore review of system is limited.  Creatinine, Ser  Date/Time Value Ref Range Status  07/16/2018 05:23 AM 3.02 (H) 0.61 - 1.24 mg/dL Final  07/15/2018 02:20 PM 2.97 (H) 0.61 - 1.24 mg/dL Final  07/10/2018 08:52 AM 2.09 (H) 0.76 - 1.27 mg/dL Final     PMHx:   Past Medical History:  Diagnosis Date  . Arthritis   . COPD (chronic obstructive pulmonary disease) ([place])   . DVT, lower extremity ([place])   . Gout   . History of pulmonary embolus (PE)   . Hyperlipidemia   . Hypertension   . Obesity   . Obstructive sleep apnea   . Renal carcinoma [facility])      Past Surgical History:  Procedure Laterality Date  . NEPHRECTOMY Right     Family Hx: History reviewed. No pertinent family history.  Social History:  reports that he has quit smoking. He has never used smokeless tobacco. He reports previous alcohol use. No history on file for drug.  Allergies:  Allergies  Allergen Reactions  . Oxycodone   . Codeine Itching    Medications: Prior to Admission medications   Medication Sig Start Date End Date Taking? Authorizing Provider  acetaminophen (TYLENOL) 325 MG tablet Take 650 mg by mouth every 4 (four) hours as needed.  10/01/16   [provider]  allopurinol (ZYLOPRIM) 100 MG tablet Take 1 tablet by mouth daily. 05/28/18   [provider]  bethanechol (URECHOLINE) 50 MG tablet Take by mouth 2 (two) times daily.     [provider]  dronedarone (MULTAQ) 400 MG tablet Take 1 tablet (400 mg total) by mouth 2 (two) times daily with a meal. 06/09/18   Revankar, Reita Cliche, MD  Ferrous Sulfate (IRON) 325 (65 Fe) MG TABS Take 1 tablet by mouth daily.    [provider]  gabapentin (NEURONTIN) 100 MG capsule Take 1 capsule by mouth 3 (three) times daily. 05/06/18   [provider]  metoprolol tartrate (LOPRESSOR) 25 MG tablet Take 0.5 tablets (12.5 mg total) by mouth 2 (two) times daily. 06/11/18   Revankar, Reita Cliche, MD  Misc Natural Products (TART CHERRY ADVANCED PO) Take 500 mg by mouth 3 (three) times daily.     [provider]  rivaroxaban (XARELTO) 20 MG TABS tablet Take 20 mg by mouth. 10/22/16   [provider]  sennosides-docusate sodium (SENOKOT-S) 8.6-50 MG tablet Take 1 tablet by mouth daily.    [provider]  tamsulosin (FLOMAX) 0.4 MG CAPS capsule Take 1 capsule by mouth daily. 04/09/18   [provider]  torsemide (DEMADEX) 10 MG tablet Take 1 tablet (10 mg total) by mouth 2 (two) times daily. 07/13/18   Revankar, Reita Cliche, MD    I have reviewed the patient's  current medications.  Labs:  Results for orders placed or performed during the hospital encounter of 07/15/18 (from the past 48 hour(s))  Comprehensive metabolic panel     Status: Abnormal   Collection Time: 07/15/18  2:20 PM  Result Value Ref Range   Sodium 140 135 - 145 mmol/L   Potassium 4.8 3.5 - 5.1 mmol/L   Chloride 100 98 - 111 mmol/L   CO2 31 22 - 32 mmol/L   Glucose, Bld 105 (H) 70 - 99 mg/dL   BUN 39 (H) 8 - 23 mg/dL   Creatinine, Ser 2.97 (H) 0.61 - 1.24 mg/dL   Calcium 8.4 (L) 8.9 - 10.3 mg/dL   Total Protein 6.3 (L) 6.5 - 8.1 g/dL   Albumin 3.5 3.5 - 5.0 g/dL   AST 19 15 - 41 U/L   ALT 12 0 - 44 U/L   Alkaline Phosphatase 53 38 - 126 U/L   Total Bilirubin 0.7 0.3 - 1.2 mg/dL   GFR calc non Af Amer 20 (L) >60 mL/min   GFR calc Af Amer 23 (L) >60 mL/min   Anion gap 9 5 - 15    Comment: Performed at [facility], [address], [place], [place] [address]  Brain natriuretic peptide     Status: Abnormal   Collection Time: 07/15/18  2:20 PM  Result Value Ref Range   B Natriuretic Peptide 1,038.4 (H) 0.0 - 100.0 pg/mL    Comment: Performed at [facility] [address], [place], [place] [address]  Troponin I - Once     Status: Abnormal   Collection Time: 07/15/18  2:20 PM  Result Value Ref Range   Troponin I 0.13 (HH) <0.03 ng/mL    Comment: CRITICAL RESULT CALLED TO, READ BACK BY AND VERIFIED WITH: A GORTON,RN 1522 07/15/2018 D BRADLEY Performed at [facility], [place] [address], [place], [place] [address]   CBC with Differential     Status: Abnormal   Collection Time: 07/15/18  2:20 PM  Result Value Ref Range   WBC 8.2 4.0 - 10.5 K/uL   RBC 3.45 (L) 4.22 - 5.81 MIL/uL   Hemoglobin 10.6 (L) 13.0 - 17.0 g/dL   HCT 36.8 (L) 39.0 - 52.0 %   MCV 106.7 (H) 80.0 - 100.0 fL   MCH 30.7 26.0 - 34.0 pg   MCHC 28.8 (L) 30.0 - 36.0 g/dL   RDW 14.4 11.5 - 15.5 %   Platelets 160 150 - 400 K/uL   nRBC 0.2 0.0 - 0.2 %   Neutrophils Relative % 69 %   Neutro  Abs 5.7 1.7 - 7.7 K/uL   Lymphocytes Relative 17 %   Lymphs Abs 1.4 0.7 - 4.0 K/uL   Monocytes Relative 12 %   Monocytes Absolute 1.0 0.1 - 1.0 K/uL   Eosinophils Relative 1 %   Eosinophils Absolute 0.1 0.0 - 0.5 K/uL   Basophils Relative 0 %  Basophils Absolute 0.0 0.0 - 0.1 K/uL   Immature Granulocytes 1 %   Abs Immature Granulocytes 0.07 0.00 - 0.07 K/uL    Comment: Performed at [facility], [place] [address], [place], [place] [address]  Sodium, urine, random     Status: None   Collection Time: 07/15/18  3:10 PM  Result Value Ref Range   Sodium, Ur 34 mmol/L    Comment: Performed at [facility] [address], [place], [place] [address]  Creatinine, urine, random     Status: None   Collection Time: 07/15/18  3:10 PM  Result Value Ref Range   Creatinine, Urine 181.25 mg/dL    Comment: Performed at [facility] [address] [place], [place] [address]  Urinalysis, Routine w reflex microscopic     Status: None   Collection Time: 07/15/18  4:00 PM  Result Value Ref Range   Color, Urine YELLOW YELLOW   APPearance CLEAR CLEAR   Specific Gravity, Urine 1.013 1.005 - 1.030   pH 5.0 5.0 - 8.0   Glucose, UA NEGATIVE NEGATIVE mg/dL   Hgb urine dipstick NEGATIVE NEGATIVE   Bilirubin Urine NEGATIVE NEGATIVE   Ketones, ur NEGATIVE NEGATIVE mg/dL   Protein, ur NEGATIVE NEGATIVE mg/dL   Nitrite NEGATIVE NEGATIVE   Leukocytes,Ua NEGATIVE NEGATIVE    Comment: Performed at [facility] [address] [place], [place] [address]  Troponin I - Now Then Q6H     Status: Abnormal   Collection Time: 07/15/18  7:02 PM  Result Value Ref Range   Troponin I 0.14 (HH) <0.03 ng/mL    Comment: CRITICAL VALUE NOTED.  VALUE IS CONSISTENT WITH PREVIOUSLY REPORTED AND CALLED VALUE. Performed at [facility], [place] [address] [place], [place] [address]   Troponin I - Now Then Q6H     Status: Abnormal   Collection Time: 07/15/18 11:06 PM  Result Value Ref Range    Troponin I 0.12 (HH) <0.03 ng/mL    Comment: CRITICAL VALUE NOTED.  VALUE IS CONSISTENT WITH PREVIOUSLY REPORTED AND CALLED VALUE. Performed at [facility], [place] [address], [place], [place] [address]   Basic metabolic panel     Status: Abnormal   Collection Time: 07/16/18  5:23 AM  Result Value Ref Range   Sodium 139 135 - 145 mmol/L   Potassium 4.9 3.5 - 5.1 mmol/L   Chloride 99 98 - 111 mmol/L   CO2 29 22 - 32 mmol/L   Glucose, Bld 94 70 - 99 mg/dL   BUN 42 (H) 8 - 23 mg/dL   Creatinine, Ser 3.02 (H) 0.61 - 1.24 mg/dL   Calcium 8.2 (L) 8.9 - 10.3 mg/dL   GFR calc non Af Amer 20 (L) >60 mL/min   GFR calc Af Amer 23 (L) >60 mL/min   Anion gap 11 5 - 15    Comment: Performed at [facility] [address] [place], [place] [address]  Troponin I - Now Then Q6H     Status: Abnormal   Collection Time: 07/16/18  5:23 AM  Result Value Ref Range   Troponin I 0.09 (HH) <0.03 ng/mL    Comment: CRITICAL VALUE NOTED.  VALUE IS CONSISTENT WITH PREVIOUSLY REPORTED AND CALLED VALUE. Performed at [facility], [place] [address] [place], [place] [address]   Phosphorus     Status: Abnormal   Collection Time: 07/16/18  5:23 AM  Result Value Ref Range  Phosphorus 5.8 (H) 2.5 - 4.6 mg/dL    Comment: Performed at [facility], [place] [address], [place], [place] [address]  Magnesium     Status: None   Collection Time: 07/16/18  5:23 AM  Result Value Ref Range   Magnesium 2.3 1.7 - 2.4 mg/dL    Comment: Performed at [facility] [address], [place], [place] [address]     ROS:  Pertinent items noted in HPI, review of systems limited because patient is confused. Physical Exam: Vitals:   07/16/18 0512 07/16/18 1251  BP: (!) 124/46 (!) 127/51  Pulse: 75 (!) 37  Resp: 17   Temp: (!) 97.5 F (36.4 C) 98.8 F (37.1 C)  SpO2: 94% 90%     General exam: Appears calm, comfortable and confused. Respiratory system: Bibasal reduced breath sounds, no increased work  of breathing.   Cardiovascular system: S1 & S2 heard, RRR.  Lower extremities pitting edema noted. Gastrointestinal system: Abdomen is distended, soft and nontender. Normal bowel sounds heard. Central nervous system: Alert awake but confused Extremities: No cyanosis, no clubbing.  Has edema. Skin: No rashes, lesions or ulcers Psychiatry: Judgement and insight appear impaired  Assessment/Plan:  # AKI likely due to combination of reduced renal perfusion in the setting of CHF exacerbation, hypotensive episode and possible obstruction: Patient has solitary kidney which puts him at risk for AKI. The baseline serum creatinine level unknown, he likely has underlying CKD. The systolic BP was 85Y on 8/50/2774. The foley catheter was placed today with immediate output of 500 cc urine. Patient is receiving IV lasix 160 mg currently.  -The urinalysis is unremarkable with no protein.  I will repeat urinalysis for microscopy.  Ultrasound of left kidney with no hydronephrosis.  Continue Foley catheter.  I will start IV Lasix twice a day for volume overload.  Monitor BMP and urine output.  Discussed with the nursing staff.  Avoid hypotensive episode, IV contrast or nephrotoxins. Continue flomax.  #CHF exacerbation, diastolic: Echo reviewed.  He has lower extremity edema.  Continue diuresis as discussed above.  #COPD with hypoxic respiratory failure: Patient has history of COPD and PE.  Given abnormal chest x-ray finding patient may need CT chest without contrast for further evaluation.  Continue oxygen and bronchodilators.  #Hypertension: Blood pressure acceptable.  Continue diuretics.  Avoid hypotensive episode.  #Acute encephalopathy: BUN is minimally elevated.  Recommend to check ABG.  No focal deficit.  Management per primary team.   Thank you for the consult, we'll continue to follow with you.    Tanna Furry 07/16/2018, 2:02 PM  [facility].

## 2018-07-16 NOTE — Progress Notes (Signed)
  Date: 07/16/2018  Patient name: Ralph Powell  Medical record number: [id number]  Date of birth: 1946-01-23   I have seen and evaluated this patient and I have discussed the plan of care with the house staff. Please see their note for complete details. I concur with their findings with the following additions/corrections:   Please see my separate attestation of the H&P from 07/15/2018.  Lenice Pressman, M.D., Ph.D. 07/16/2018, 2:01 PM

## 2018-07-16 NOTE — Progress Notes (Signed)
Paged [phone] to inform of critical ABG values. Awaiting response.

## 2018-07-16 NOTE — Significant Event (Signed)
ABG showed acute on chronic hypercarbic respiratory failure: 7.22/81/61/32. With initial bicarb of 27, expect baseline CO2 retention with pCO2 of 47 - 51. May be contributing to acute toxic/metabolic encephalopathy, started on biPAP and transferred to progressive care.   I reevaluated him shortly after transfer and biPAP initiation. He was agitated and had just been placed in mitts for pulling at lines. He said he was "getting out of here". Concern for risk of self-harm, discussed with his RN Chat, will give IV haldol 5 mg to try to calm him. Will need to maximize nonpharmacologic interventions for delirium as well.   Plan to recheck ABG after a couple hours on biPAP to ensure respiratory status improving. Achieving good tidal volumes on my assessment on 18/8.
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
## 2018-07-16 NOTE — Evaluation (Signed)
Physical Therapy Evaluation Patient Details Name: Ralph Powell MRN: [id number] DOB: [birth year] Today's Date: 07/16/2018   History of Present Illness  [name] is a 73 y.o male with a history of paroxysmal atrial fibrillation, DVT, PE, COPD on 3L supplemental oxygen, HTN, obseity and history of renal cell carcinoma with a right nephrectomy presenting to the ED due to confusion and decreased urinary output.  Work up includes CHF and AKI  Clinical Impression  Pt admitted with/for confusion and decreased urinary output.  Pt presently is also showing jerking movements that make mobility harder and patient needing minimal to mod assist for basic mobility and gait.Marland Kitchen  Pt currently limited functionally due to the problems listed below.  (see problems list.)  Pt will benefit from PT to maximize function and safety to be able to get home safely with available assist.     Follow Up Recommendations Home health PT;Supervision/Assistance - 24 hour(making an assumption he improves steadily and wife can assis)    Equipment Recommendations  Other (comment);None recommended by PT(TBA)    Recommendations for Other Services       Precautions / Restrictions Precautions Precautions: Fall      Mobility  Bed Mobility Overal bed mobility: Needs Assistance Bed Mobility: Supine to Sit;Sit to Supine     Supine to sit: Min assist Sit to supine: Min assist   General bed mobility comments: assisting trunk and support whiile pt doing the majority of work to/from [employer] Overall transfer level: Needs assistance   Transfers: Sit to/from Stand Sit to Stand: Min assist;Mod assist(mod from low toilet)         General transfer comment: cues for technique, assist to come forward and up.  Ambulation/Gait Ambulation/Gait assistance: Mod assist Gait Distance (Feet): 10 Feet(x2) Assistive device: Rolling walker (2 wheeled) Gait Pattern/deviations: Step-through pattern;Step-to pattern   Gait  velocity interpretation: <1.31 ft/sec, indicative of household ambulator General Gait Details: significant assist and guard due to severe jerk and buckling.  Postural checks for flexed posture.  Stairs            Wheelchair Mobility    Modified Rankin (Stroke Patients Only)       Balance Overall balance assessment: Needs assistance Sitting-balance support: Bilateral upper extremity supported;Single extremity supported;Feet supported Sitting balance-Leahy Scale: Poor Sitting balance - Comments: uncontrolled jerking making it difficult to maintain balance.     Standing balance-Leahy Scale: Poor Standing balance comment: reliant on RW or external support due to jerking.                             Pertinent Vitals/Pain Pain Assessment: Faces Faces Pain Scale: No hurt    Home Living Family/patient expects to be discharged to:: Private residence Living Arrangements: Spouse/significant other Available Help at Discharge: Family;Available 24 hours/day Type of Home: House Home Access: Stairs to enter Entrance Stairs-Rails: (? steps) Entrance Stairs-Number of Steps: several Home Layout: Multi-level;Able to live on main level with bedroom/bathroom Home Equipment: Grab bars - toilet;Grab bars - tub/shower(pt stated he used a RW, but couldn't relate which kind.)      Prior Function Level of Independence: Independent with assistive device(s)         Comments: pt stated he did his basic ADL's, did not drive.     Hand Dominance        Extremity/Trunk Assessment   Upper Extremity Assessment Upper Extremity Assessment: (jerking trunk and R UE)  Lower Extremity Assessment Lower Extremity Assessment: RLE deficits/detail;LLE deficits/detail RLE Deficits / Details: general weakness, but grossly 4/5, jerkiness and quick collapse of legs with w/bearing RLE Coordination: decreased fine motor LLE Deficits / Details: grossly 4/5 and jerky.  R worse than  left. LLE Coordination: decreased fine motor    Cervical / Trunk Assessment Cervical / Trunk Assessment: (jerking trunk, not tremulous.)  Communication   Communication: No difficulties  Cognition Arousal/Alertness: Awake/alert Behavior During Therapy: Flat affect;Restless Overall Cognitive Status: No family/caregiver present to determine baseline cognitive functioning(slow to follow simple commands)                                 General Comments: distractably      General Comments General comments (skin integrity, edema, etc.): vital generally stable with EHR in the 110's and 120's, sats at 98%    Exercises     Assessment/Plan    PT Assessment Patient needs continued PT services  PT Problem List Decreased strength;Decreased activity tolerance;Decreased mobility;Decreased coordination;Decreased knowledge of use of DME;Decreased safety awareness       PT Treatment Interventions Gait training;Stair training;Functional mobility training;Therapeutic activities;Balance training;Patient/family education    PT Goals (Current goals can be found in the Care Plan section)  Acute Rehab PT Goals Patient Stated Goal: unable to participate PT Goal Formulation: With patient Time For Goal Achievement: 07/30/18 Potential to Achieve Goals: Good    Frequency Min 3X/week   Barriers to discharge        Co-evaluation               AM-PAC PT "6 Clicks" Mobility  Outcome Measure Help needed turning from your back to your side while in a flat bed without using bedrails?: A Little Help needed moving from lying on your back to sitting on the side of a flat bed without using bedrails?: A Little Help needed moving to and from a bed to a chair (including a wheelchair)?: A Little Help needed standing up from a chair using your arms (e.g., wheelchair or bedside chair)?: A Little Help needed to walk in hospital room?: A Little Help needed climbing 3-5 steps with a railing? :  A Lot 6 Click Score: 17    End of Session Equipment Utilized During Treatment: Oxygen Activity Tolerance: Patient tolerated treatment well;Patient limited by fatigue Patient left: in bed;with call bell/phone within reach;with bed alarm set Nurse Communication: Mobility status PT Visit Diagnosis: Unsteadiness on feet (R26.81);Other abnormalities of gait and mobility (R26.89);Difficulty in walking, not elsewhere classified (R26.2)    Time: 4132-4401 PT Time Calculation (min) (ACUTE ONLY): 33 min   Charges:   PT Evaluation $PT Eval Moderate Complexity: 1 Mod PT Treatments $Therapeutic Activity: 8-22 mins        07/16/2018  Donnella Sham, PT Acute Rehabilitation Services [phone]  (pager) [phone]  (office)  Tessie Fass Lujean Ebright 07/16/2018, 3:16 PM

## 2018-07-16 NOTE — Progress Notes (Signed)
Latest ABG result relayed to MD to check in the computer ,no order given.

## 2018-07-16 NOTE — Progress Notes (Signed)
Transferred -in from [facility] by bed awake and alert. Noted with twitching  Of left arm on and off which is not new as per Report from RN

## 2018-07-16 NOTE — Progress Notes (Signed)
ANTICOAGULATION CONSULT NOTE - Initial Consult  Pharmacy Consult for heparin Indication: atrial fibrillation  Allergies  Allergen Reactions  . Oxycodone Itching  . Codeine Itching    Patient Measurements: Height: _0  (190.5 cm) Weight: 296 lb 11.8 oz (134.6 kg) IBW/kg (Calculated) : 84.5 Heparin Dosing Weight: 114kg  Vital Signs: Temp: 98.5 F (36.9 C) (04/16 1526) Temp Source: Oral (04/16 1526) BP: 126/65 (04/16 1530) Pulse Rate: 65 (04/16 1530)  Labs: Recent Labs    07/15/18 1420 07/15/18 1902 07/15/18 2306 07/16/18 0523  HGB 10.6*  --   --   --   HCT 36.8*  --   --   --   PLT 160  --   --   --   CREATININE 2.97*  --   --  3.02*  TROPONINI 0.13* 0.14* 0.12* 0.09*    Estimated Creatinine Clearance: 33.2 mL/min (A) (by C-G formula based on SCr of 3.02 mg/dL (H)).   Medical History: Past Medical History:  Diagnosis Date  . Arthritis   . COPD (chronic obstructive pulmonary disease) ([place])   . DVT, lower extremity ([place])   . Gout   . History of pulmonary embolus ([place])   . Hyperlipidemia   . Hypertension   . Obesity   . Obstructive sleep apnea   . Renal carcinoma [facility])    Assessment: 73 year old male with PAF currently on xarelto (given last night ~2100). Now with HF exacerbation and AKI with scr up to 3. New orders to hold xarelto and start IV heparin in case invasive procedures are needed.   Will start heparin tonight, allowing previous dose to somewhat clear. Will monitor and adjust heparin rates based on aptt and anti-xa levels.   Goal of Therapy:  Heparin level 0.3-0.7 units/ml aPTT 66-102 seconds Monitor platelets by anticoagulation protocol: Yes   Plan:  Start heparin at 1500 units/hr tonight Aptt and heparin level in am  Erin Hearing PharmD., BCPS Clinical Pharmacist 07/16/2018 6:29 PM     Georgina Peer 07/16/2018,6:21 PM

## 2018-07-16 NOTE — Discharge Instructions (Addendum)
Ralph Powell,   It has been a pleasure working with you and we are glad you're feeling better. You were hospitalized for a heart failure exacerbation that was likely due to your pulmonary issues. We have diuresed you and you now have a new dry weight. You had a procedure to check your heart and it looks like the fluid has been removed and is doing better. We have changed some of your medications.   Please START taking: -Torsemide 40 mg (2 tablets) daily -Amiodarone 200 mg daily -Spironolactone 12.5 mg (1/2 tablet) daily  Please STOP taking: -Dronedarone (multaq)  -Furosemide (Lasix) -Metoprolol (Lopressor)  Please follow up with cardiology next week, they should call you to set up appointment Follow up with pulmonology in 1-2 weeks Follow up with your primary care provider in 1 weeks  If your symptoms worsen or you develop new symptoms, please seek medical help whether it is your primary care provider or emergency department.  If you have any questions about this hospitalization please call [phone].   Information on my medicine - XARELTO (Rivaroxaban)  Why was Xarelto prescribed for you? Xarelto was prescribed for you to reduce the risk of a blood clot forming that can cause a stroke if you have a medical condition called atrial fibrillation (a type of irregular heartbeat).  What do you need to know about xarelto ? Take your Xarelto ONCE DAILY at the same time every day with your evening meal. If you have difficulty swallowing the tablet whole, you may crush it and mix in applesauce just prior to taking your dose.  Take Xarelto exactly as prescribed by your doctor and DO NOT stop taking Xarelto without talking to the doctor who prescribed the medication.  Stopping without other stroke prevention medication to take the place of Xarelto may increase your risk of developing a clot that causes a stroke.  Refill your prescription before you run out.  After discharge, you  should have regular check-up appointments with your healthcare provider that is prescribing your Xarelto.  In the future your dose may need to be changed if your kidney function or weight changes by a significant amount.  What do you do if you miss a dose? If you are taking Xarelto ONCE DAILY and you miss a dose, take it as soon as you remember on the same day then continue your regularly scheduled once daily regimen the next day. Do not take two doses of Xarelto at the same time or on the same day.   Important Safety Information A possible side effect of Xarelto is bleeding. You should call your healthcare provider right away if you experience any of the following: ? Bleeding from an injury or your nose that does not stop. ? Unusual colored urine (red or dark brown) or unusual colored stools (red or black). ? Unusual bruising for unknown reasons. ? A serious fall or if you hit your head (even if there is no bleeding).  Some medicines may interact with Xarelto and might increase your risk of bleeding while on Xarelto. To help avoid this, consult your healthcare provider or pharmacist prior to using any new prescription or non-prescription medications, including herbals, vitamins, non-steroidal anti-inflammatory drugs (NSAIDs) and supplements.  This website has more information on Xarelto: [email or web address]

## 2018-07-16 NOTE — Progress Notes (Addendum)
   Subjective: Patient reports feeling well this morning. States is breathing is fine. Denies any pain. Attempted to eat breakfast but said he felt weak and had difficulty holding his fork.   Objective:  Vital signs in last 24 hours: Vitals:   07/15/18 1840 07/15/18 2118 07/15/18 2352 07/16/18 0512  BP: (!) 97/59 115/64 125/66 (!) 124/46  Pulse: (!) 44 (!) 47 (!) 50 75  Resp: _0 Temp: 97.8 F (36.6 C) 98 F (36.7 C) 97.7 F (36.5 C) (!) 97.5 F (36.4 C)  TempSrc: Oral Oral Oral Oral  SpO2: 95% 95% 92% 94%  Weight:    134.6 kg  Height:       Physical Exam: Gen: seen comfortably sleeping in bed, foley bag with minimal urine, no distress CV: RRR, no mumurs Lungs: upper expiratory wheezing, no crackles or rales, on 3L supplemental oxygen Ext: bilateral LE 2+ pitting edema, warm and well perfused   Assessment/Plan:  Active Problems:   Heart failure (HCC)   AKI (acute kidney injury) [facility])  [name] is a 73 y.o male with a history of paroxysmal atrial fibrillation, DVT, PE, COPD on 3L supplemental oxygen, HTN, obseity and history of renal cell carcinoma with a right nephrectomy presenting to the ED due to confusion and decreased urinary output. He was sent to the hospital per his PCP. Most of the history is per chart review. Patient was alert and oriented but confused during the interview. Attempted calling the wife, no answer.   Congestive Heart Failure: Presented with decreased urinary output, dyspnea, and bilateral LE edema. BNP 1038, new RBBB. Troponin peaked at 0.14, down trended 0.09 this morning, likely demand. Overnight no urine output, bladder scan 180 ml. Will increase furosemide 80 mg IV and see if he responds. If he still does not have a good urinary response will give another 160 mg IV furosemide.  - IV furosemide 80 mg BID, increase to 160 mg if minimal response   - Cardiac monitoring  - Echocardiogram - Daily weights  - Strict ins & outs    AKI:  Cr 3.02, baseline below one. Overnight no urine output after IV furosemide 40 mg. RN reports he has been dribbling and bladder scan showed about 180 cc. Renal US normal. Will increase to 80 mg this morning and see if he has a response. If he does not respond will go up on furosemide.  - Daily BMP - Avoid nephrotoxic agents   Hx of Alcohol Abuse  Hallucinations vs delirium: Unclear etiology and limited history. Possibly due to metabolic abnormalities. Patient also has a history of alcohol withdrawal delirium. No s/sx of infection. Will continue to monitor. - CIWA  - Will attempt to call family again today for more history   Paroxysmal Atrial Fibrillation: Currently in sinus rhythm. - Renally dosed xarelto 15 mg  - Hold dronedarone and metoprolol  Dispo: Anticipated discharge pending clinical improvement.   [name], DO 07/16/2018, 7:21 AM Pager: [phone]

## 2018-07-16 NOTE — Consult Note (Addendum)
Cardiology Consultation:   Patient ID: Ralph Powell; [id number]; [birth year]   Admit date: 07/15/2018 Date of Consult: 07/16/2018  Primary Care Provider: Townsend Roger, MD Primary Cardiologist: Dr. Geraldo Pitter, MD  Patient Profile:   Ralph Powell is a 73 y.o. 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 Lucianne Muss.  History of Present Illness:   [name] is a 73 year old male with a history stated above who presented to [facility] on 07/15/2018 with acute onset confusion and decreased urination. History obtained from chart review given current somnolent state. He was seen by his PCP on day of presentation for difficulty with urination and decreased urine output over the last several days. He additionally reported worsening shortness of breath over the last several weeks with increased LE swelling. He described orthopnea symptoms with trouble lying flat while trying to sleep. He denied chest pain, palpitations, cough, nausea, vomiting, diaphoresis, presyncopal or syncopal episodes. Patient reported that at baseline he is able to ambulate within his house and his wife helps manage his medications at home. Given his presenting symptoms, PCP referred him to the emergency department for further evaluation.  Of note, he was seen as a new patient by Dr. Geraldo Pitter 06/09/2018 for paroxysmal atrial fibrillation at the request of Dr. Nona Dell.  He was previously admitted to [facility] with paroxysmal atrial fibrillation with RVR and underwent a cardioversion and was seen in follow-up. He was noted to be adequately anticoagulated with Xarelto 20 mg daily.  During that office visit he was changed from amiodarone to Multaq 400 mg twice daily secondary to COPD and history of alcohol abuse. He is also noted to be on Lasix 40 mg  daily for chronic LE swelling as well as torsemide 59m BID? and Lopressor 12.5 mg twice daily for rate control. Weight at that office visit was 261lb and heart rate was noted to be 50 bpm.  Plan was for close follow-up in 1 month.  The patient had an echocardiogram 09/21/2016 which showed LVEF of 50 to 55% with no regional wall motion abnormalities and no diastolic dysfunction. There was moderate tricuspid regurgitation and moderate to severe pulmonary hypertension with a PA pressure of 60 to 65 mmHg. As above, his weight at last office visit 06/09/2018 was 261lb and weight noted on admission is 296lb, giving him a 35 pound weight gain in 1 month timeframe.  He was then seen on 07/08/2018 during a telemedicine/virtual visit with Dr. RGeraldo Pitter  During that time, he reported feeling fine however, continued to have lower extremity swelling.  He stated his Lasix was increased by his primary care physician to 40 mg daily however this did not help.  He stated that most of his weight gain (15lb at that time ) started after the initiation of Multaq (transition from amiodarone).  Plan was to continue Multaq and evaluate him with labs. proBNP was elevated at 1888 and creatinine was 2.09.  In the ED, EKG with with sinus bradycardia and evidence of RBBB and diffuse T wave abnormalities. There is also a a first-degree AV block.  EKG similar to tracing from 06/09/2018.  BNP noted to be elevated at 1038.  On and found to be elevated at 0.13 with subsequent levels at 0.14, 0.12, 0.09.  Creatinine  was found to be elevated 2.97.  He was started on IV Lasix therapy equivalent to his home dose torsemide.  Did have an episode of what appears to be vasovagal response after straining to urinate with a BP dropped to 70/48.  Echocardiogram from today, 07/16/2018 with LVEF of greater than 65% with moderately dilated RV with normal RV function. There is septal flattening c/w RV pressure/volume overload and RVSP ~70mHG.  No evidence of LV  wall motion abnormalities.  The right atrium is severely dilated. Due to confusion ABG drawn which showed CO2 retention and respiratory acidosis 7.22/81/61/88%. Patient started on bipap.   Remains confused on BIPAP. In soft restraints. Will answer some questions but hard to get meaningful history. Urine output starting to pick up with IV lasix.  Past Medical History:  Diagnosis Date   Arthritis    COPD (chronic obstructive pulmonary disease) ([place]    DVT, lower extremity ([facility])    Gout    History of pulmonary embolus (PE)    Hyperlipidemia    Hypertension    Obesity    Obstructive sleep apnea    Renal carcinoma ([place]     Past Surgical History:  Procedure Laterality Date   NEPHRECTOMY Right      Prior to Admission medications   Medication Sig Start Date End Date Taking? Authorizing Provider  acetaminophen (TYLENOL) 325 MG tablet Take 650 mg by mouth daily as needed for mild pain.  10/01/16  Yes [provider]  allopurinol (ZYLOPRIM) 100 MG tablet Take 100 mg by mouth daily.  05/28/18  Yes [provider]  bethanechol (URECHOLINE) 50 MG tablet Take 25 mg by mouth 2 (two) times daily.    Yes [provider]  Ferrous Sulfate (IRON) 325 (65 Fe) MG TABS Take 1 tablet by mouth daily.   Yes [provider]  furosemide (LASIX) 40 MG tablet Take 40 mg by mouth daily.   Yes [provider]  gabapentin (NEURONTIN) 100 MG capsule Take 100 mg by mouth 3 (three) times daily.  05/06/18  Yes [provider]  metoprolol tartrate (LOPRESSOR) 25 MG tablet Take 0.5 tablets (12.5 mg total) by mouth 2 (two) times daily. 06/11/18  Yes Revankar, RReita Cliche MD  Misc Natural Products (TART CHERRY ADVANCED PO) Take 500 mg by mouth 3 (three) times daily.    Yes [provider]  rivaroxaban (XARELTO) 20 MG TABS tablet Take 20 mg by mouth. 10/22/16  Yes [provider]  sennosides-docusate sodium (SENOKOT-S) 8.6-50 MG tablet Take 1 tablet  by mouth daily.   Yes [provider]  tamsulosin (FLOMAX) 0.4 MG CAPS capsule Take 0.4 mg by mouth daily.  04/09/18  Yes [provider]  dronedarone (MULTAQ) 400 MG tablet Take 1 tablet (400 mg total) by mouth 2 (two) times daily with a meal. 06/09/18   Revankar, RReita Cliche MD  torsemide (DEMADEX) 10 MG tablet Take 1 tablet (10 mg total) by mouth 2 (two) times daily. 07/13/18   Revankar, RReita Cliche MD    Inpatient Medications: Scheduled Meds:  allopurinol  100 mg Oral Daily   furosemide  60 mg Intravenous Q12H   rivaroxaban  15 mg Oral Q supper   sodium chloride flush  3 mL Intravenous Q12H   tamsulosin  0.4 mg Oral Daily   thiamine  100 mg Oral Daily   Continuous Infusions:  PRN Meds:   Allergies:    Allergies  Allergen Reactions   Oxycodone Itching   Codeine Itching  Social History:   Social History   Socioeconomic History   Marital status: Married    Spouse name: Not on file   Number of children: Not on file   Years of education: Not on file   Highest education level: Not on file  Occupational History   Not on file  Social Needs   Financial resource strain: Not on file   Food insecurity:    Worry: Not on file    Inability: Not on file   Transportation needs:    Medical: Not on file    Non-medical: Not on file  Tobacco Use   Smoking status: Former Smoker   Smokeless tobacco: Never Used  Substance and Sexual Activity   Alcohol use: Not Currently    Frequency: Never   Drug use: Not on file   Sexual activity: Not on file  Lifestyle   Physical activity:    Days per week: Not on file    Minutes per session: Not on file   Stress: Not on file  Relationships   Social connections:    Talks on phone: Not on file    Gets together: Not on file    Attends religious service: Not on file    Active member of club or organization: Not on file    Attends meetings of clubs or organizations: Not on file    Relationship status: Not  on file   Intimate partner violence:    Fear of current or ex partner: Not on file    Emotionally abused: Not on file    Physically abused: Not on file    Forced sexual activity: Not on file  Other Topics Concern   Not on file  Social History Narrative   Not on file    Family History:   History reviewed. No pertinent family history. Family Status:  No family status information on file.   ROS:  Please see the history of present illness.   Unable to obtain complete ROS as patient confused and not able to provide despite multiple attempts.   Physical Exam/Data:   Vitals:   07/15/18 2118 07/15/18 2352 07/16/18 0512 07/16/18 1251  BP: 115/64 125/66 (!) 124/46 (!) 127/51  Pulse: (!) 47 (!) 50 75 (!) 37  Resp: _0 Temp: 98 F (36.7 C) 97.7 F (36.5 C) (!) 97.5 F (36.4 C) 98.8 F (37.1 C)  TempSrc: Oral Oral Oral Oral  SpO2: 95% 92% 94% 90%  Weight:   134.6 kg   Height:        Intake/Output Summary (Last 24 hours) at 07/16/2018 1439 Last data filed at 07/16/2018 1358 Gross per 24 hour  Intake 720 ml  Output 1125 ml  Net -405 ml   Filed Weights   07/16/18 0512  Weight: 134.6 kg   Body mass index is 37.09 kg/m.   See MD note  EKG:  The EKG was personally reviewed and demonstrates: SB with RBBB and diffuse T wave abnormalities  Telemetry:  Telemetry was personally reviewed and demonstrates:  Sinus brady 50-55.   Relevant CV Studies:  Echocardiogram 07/15/2018: 1. The left ventricle has hyperdynamic systolic function, with an ejection fraction of >65%. The cavity size was mildly dilated. Left ventricular diastolic Doppler parameters are indeterminate. There is right ventricular volume and pressure overload. No  evidence of left ventricular regional wall motion abnormalities.  2. The right ventricle has normal systolic function. The cavity was moderately enlarged. There is no increase in right ventricular  wall thickness. Right ventricular systolic pressure is  moderately elevated with an estimated pressure of 66.8 mmHg.  3. Left atrial size was mildly dilated.  4. Right atrial size was severely dilated.  5. There is mild mitral annular calcification present.  6. Tricuspid valve regurgitation is mild-moderate.  7. The aortic valve is tricuspid. Mild sclerosis of the aortic valve.  8. There is mild dilatation of the ascending aorta measuring 39 mm.  9. The inferior vena cava was dilated in size with <50% respiratory variability.   ECHO: 09/21/2016: Procedure date Date: 09/21/2016 Start: 08:56 AM Technical Quality: Adequate visualization Study Location: Portable Indications: Congestive heart failure. Patient Status: Routine Height: 72 inches Weight: 201 pounds BSA: 2.13 m2 BMI: 27.26 kg/m2 BP: 93/58 mmHg Conclusions Summary Technically difficult. Frequent PACs or PVCs were noted. Low-normal LV systolic function. Ejection fraction is visually estimated at 50-55%. No LV regional wall motion abnormalities were seen. Diastolic function appears normal. Moderate tricuspid regurgitation. Moderate to severe pulmonary hypertension. Est. PA systolic pressure 57-50 mmHg. There is no prior echo for comparison.  CATH: None  Laboratory Data:  Chemistry Recent Labs  Lab 07/10/18 0852 07/17/2018 1420 07/16/18 0523  NA 143 140 139  K 4.5 4.8 4.9  CL 100 100 99  CO2 _0 GLUCOSE 101* 105* 94  BUN 33* 39* 42*  CREATININE 2.09* 2.97* 3.02*  CALCIUM 8.6 8.4* 8.2*  GFRNONAA 31* 20* 20*  GFRAA 36* 23* 23*  ANIONGAP  --  9 11    Total Protein  Date Value Ref Range Status  July 17, 2018 6.3 (L) 6.5 - 8.1 g/dL Final   Albumin  Date Value Ref Range Status  2018/07/17 3.5 3.5 - 5.0 g/dL Final   AST  Date Value Ref Range Status  07-17-2018 19 15 - 41 U/L Final   ALT  Date Value Ref Range Status  17-Jul-2018 12 0 - 44 U/L Final   Alkaline Phosphatase  Date Value Ref Range Status  2018/07/17 53 38 - 126 U/L Final   Total Bilirubin    Date Value Ref Range Status  17-Jul-2018 0.7 0.3 - 1.2 mg/dL Final   Hematology Recent Labs  Lab 07/10/18 0852 2018-07-17 1420  WBC 6.4 8.2  RBC 3.28* 3.45*  HGB 10.7* 10.6*  HCT 32.1* 36.8*  MCV 98* 106.7*  MCH 32.6 30.7  MCHC 33.3 28.8*  RDW 12.9 14.4  PLT 144* 160   Cardiac Enzymes Recent Labs  Lab 07/17/18 1420 07/17/2018 1902 2018/07/17 2306 07/16/18 0523  TROPONINI 0.13* 0.14* 0.12* 0.09*   No results for input(s): TROPIPOC in the last 168 hours.  BNP Recent Labs  Lab 07/10/18 0852 07-17-2018 1420  BNP  --  1,038.4*  PROBNP 1,888*  --     DDimer No results for input(s): DDIMER in the last 168 hours. TSH: No results found for: TSH Lipids:No results found for: CHOL, HDL, LDLCALC, LDLDIRECT, TRIG, CHOLHDL HgbA1c:No results found for: HGBA1C  Radiology/Studies:  US Renal  Result Date: 07-17-18 CLINICAL DATA:  Acute kidney injury EXAM: RENAL / URINARY TRACT ULTRASOUND COMPLETE COMPARISON:  None. FINDINGS: Right Kidney: Right nephrectomy. Left Kidney: Renal measurements: 12.4 x 6.1 x 7.1 cm = volume: 282 mL. Echogenicity within normal limits. No mass or hydronephrosis visualized. Bladder: Appears normal for degree of bladder distention. IMPRESSION: 1. Prior right nephrectomy. 2. Normal left kidney. Electronically Signed   By: Kathreen Devoid   On: Jul 17, 2018 19:55   Dg Chest Portable 1 View  Result Date: July 17, 2018 CLINICAL  DATA:  Shortness of breath and tremors for 1 day. History of COPD. EXAM: PORTABLE CHEST 1 VIEW COMPARISON:  07/13/2018 FINDINGS: The cardiomediastinal silhouette is unchanged with the left heart border remaining partially obscured. Small to moderate left and small right pleural effusions and left greater than right basilar lung opacities are unchanged. No edema or pneumothorax is identified. No acute osseous abnormality is seen. IMPRESSION: Unchanged left larger than right pleural effusions and basilar lung opacities, at least in part atelectasis though  pneumonia is also possible in the left lung base. Electronically Signed   By: Logan Bores M.D.   On: 07/15/2018 14:09   Assessment and Plan:   1.  Acute Diastolic CHF exacerbation with R>>L symptoms: -Patient initially presented to PCP for difficulty with urination and has had increased confusion for the last several days. Given his presenting symptoms it was recommended that he go to the ED for further evaluation.  -EKG with with sinus bradycardia and evidence of RBBB and diffuse T wave abnormalities. There is also a a first-degree AV block. BNP noted to be elevated at 1038.  -Troponin found to be elevated at 0.13 with subsequent levels at 0.14, 0.12, 0.09 -Echocardiogram from today, 07/16/2018 with LVEF of greater than 65% with right ventricular volume and pressure overload and no evidence of wall motion abnormalities. The right atrium is severely dilated. -CXR with unchanged left larger than right pleural effusion with basilar lung opacities -Treated with IV Lasix 160 mg twice daily (patient on home torsemide 10 mg twice daily) -Creatinine was found to be elevated 2.97 (no baseline for comparison in Epic). Creatinine from 10/01/2016 in care everywhere appears to be in the 1.0-1.2 range   -I &O, net -165 mL -Weight, 296lb. patient was noted to be 261lb approximately 1 month ago leaving him with a 35 pound weight gain over the last month. -Would continue diuretics as directed by nephrology   2.  Acute kidney injury with underlying CKD stage II with h/o previous nephrectomy due to RCC: -Prior creatinine noted from 2018 with a baseline of 1.0-1.2 -Admission creatinine of 2.97 which is now increased to 3.02 -Patient reports fluid weight gain of approximately 35 pounds in 1 month timeframe -Was on home torsemide 10 mg twice daily -Nephrology consulted given creatinine as well as difficulty urination at baseline -Renal ultrasound with no hydronephrosis  -Foley catheter currently in place per  nephrology recommendation given suspected cardiorenal syndrome  3.  Hx of paroxysmal atrial fibrillation: -Currently in NSR.SB -Recently diagnosis after hospitalization with PAF -Seen in outpatient setting by cardiology -Anticoagulated with Xarelto, now renally dosed at 15 mg daily.  Underwent recent cardioversion however, per echocardiogram has severely dilated right atria -Multaq and metoprolol on hold secondary to bradycardia  4.  History of COPD: -On home supplemental oxygenation at 3 L nasal cannula -Per primary team  5.  OSA: -Not currently on CPAP  6. Bradycardia: -HR continues to be in the 40-50 range -Multaq and metoprolol discontinued   For questions or updates, please contact [place] Please consult [email or web address] for contact info under Cardiology/STEMI.   Lyndel Safe NP-C [facility] Pager: [phone] 07/16/2018 2:39 PM  Patient seen and examined with the above-signed Advanced Practice Provider and/or Housestaff. I personally reviewed laboratory data, imaging studies and relevant notes. I independently examined the patient and formulated the important aspects of the plan. I have edited the note to reflect any of my changes or salient points. I have personally discussed the plan with the patient  and/or family.  Complicated 73 y/o male with multiple medical problems including obesity, chronic respiratory failure due to COPD/untreated OSA/OHS, CKD s/p right nephrectomy and PAF s/p recent DC-CV and now on Multaq/Xarelto.  Patient admitted with progressive volume overload and AKI.   Echo shows normal LV function with evidence of pulmonary HTN and chronic cor pulmonale with RV pressure volume overload. Course complicated by worsening hypercarbic respiratory failure and AKI. He has been place on BIPAP. Renal has seen and started high-dose IV lasix which he seems to be responding to. Multaq and metoprolol held due to SB with rates in 50s.   On exam Confused. On  bipap. In restraints. Unkempt JVP to ear Cor brady regular  Lungs coarse dull at bases Ab obese + distended Ext 3+ edema  He is markedly volume overloaded in the setting of acute on chronic HF with R>>L symptoms and acute/chronic cor pulmonale due to longstanding COPD/OSA/OHS (WHO Group III pulmonary HTN). Situation complicated by AKI in setting of solitary kidney and hypotension. BP now improved.   Agree with aggressive IV diuresis. If not responding would place central access to measure CVP ad co-ox and can consider dobutamine for RV support. Would likely be a poor HD candidate. Agree with holding Multaq for now with bradycardia. Given HF may consider switching back to amio despite underlying lung disease. Would hold Xarelto and switch to heparin in case need for invasive procedures.   Further recommendations based on clinical course.   CRITICAL CARE Performed by: Glori Bickers  Total critical care time: 45 minutes  Critical care time was exclusive of separately billable procedures and treating other patients.  Critical care was necessary to treat or prevent imminent or life-threatening deterioration.  Critical care was time spent personally by me (independent of midlevel providers or residents) on the following activities: development of treatment plan with patient and/or surrogate as well as nursing, discussions with consultants, evaluation of patient's response to treatment, examination of patient, obtaining history from patient or surrogate, ordering and performing treatments and interventions, ordering and review of laboratory studies, ordering and review of radiographic studies, pulse oximetry and re-evaluation of patient's condition.    Glori Bickers, MD  5:48 PM

## 2018-07-16 NOTE — Progress Notes (Signed)
Attempted to pull out pulse ox and foley cath despite instructuions  given not to do it.  Bil mittens applied. Continue to monitor.

## 2018-07-16 NOTE — Progress Notes (Signed)
Pt transferred to 2c09. Report given to nurse.

## 2018-07-17 DIAGNOSIS — G9340 Encephalopathy, unspecified: Secondary | ICD-10-CM

## 2018-07-17 DIAGNOSIS — N182 Chronic kidney disease, stage 2 (mild): Secondary | ICD-10-CM

## 2018-07-17 DIAGNOSIS — I130 Hypertensive heart and chronic kidney disease with heart failure and stage 1 through stage 4 chronic kidney disease, or unspecified chronic kidney disease: Principal | ICD-10-CM

## 2018-07-17 DIAGNOSIS — R001 Bradycardia, unspecified: Secondary | ICD-10-CM

## 2018-07-17 DIAGNOSIS — N179 Acute kidney failure, unspecified: Secondary | ICD-10-CM | POA: Diagnosis present

## 2018-07-17 LAB — APTT
aPTT: 58 seconds — ABNORMAL HIGH (ref 24–36)
aPTT: 74 seconds — ABNORMAL HIGH (ref 24–36)

## 2018-07-17 LAB — RENAL FUNCTION PANEL
Albumin: 3.1 g/dL — ABNORMAL LOW (ref 3.5–5.0)
Anion gap: 10 (ref 5–15)
BUN: 44 mg/dL — ABNORMAL HIGH (ref 8–23)
CO2: 32 mmol/L (ref 22–32)
Calcium: 8.1 mg/dL — ABNORMAL LOW (ref 8.9–10.3)
Chloride: 98 mmol/L (ref 98–111)
Creatinine, Ser: 2.83 mg/dL — ABNORMAL HIGH (ref 0.61–1.24)
GFR calc Af Amer: 25 mL/min — ABNORMAL LOW (ref >60)
GFR calc non Af Amer: 21 mL/min — ABNORMAL LOW (ref >60)
Glucose, Bld: 81 mg/dL (ref 70–99)
Phosphorus: 4.8 mg/dL — ABNORMAL HIGH (ref 2.5–4.6)
Potassium: 4.5 mmol/L (ref 3.5–5.1)
Sodium: 140 mmol/L (ref 135–145)

## 2018-07-17 LAB — BLOOD GAS, ARTERIAL
Acid-Base Excess: 7.3 mmol/L — ABNORMAL HIGH (ref 0.0–2.0)
Bicarbonate: 33.6 mmol/L — ABNORMAL HIGH (ref 20.0–28.0)
Delivery systems: POSITIVE
Drawn by: 365271
Expiratory PAP: 8
FIO2: 80
Inspiratory PAP: 18
O2 Saturation: 96.1 %
Patient temperature: 98.6
RATE: 8 resp/min
pCO2 arterial: 71.9 mmHg (ref 32.0–48.0)
pH, Arterial: 7.291 — ABNORMAL LOW (ref 7.350–7.450)
pO2, Arterial: 85.9 mmHg (ref 83.0–108.0)

## 2018-07-17 LAB — CBC
HCT: 33.5 % — ABNORMAL LOW (ref 39.0–52.0)
Hemoglobin: 9.8 g/dL — ABNORMAL LOW (ref 13.0–17.0)
MCH: 30.3 pg (ref 26.0–34.0)
MCHC: 29.3 g/dL — ABNORMAL LOW (ref 30.0–36.0)
MCV: 103.7 fL — ABNORMAL HIGH (ref 80.0–100.0)
Platelets: 146 10*3/uL — ABNORMAL LOW (ref 150–400)
RBC: 3.23 MIL/uL — ABNORMAL LOW (ref 4.22–5.81)
RDW: 14.6 % (ref 11.5–15.5)
WBC: 6.7 10*3/uL (ref 4.0–10.5)
nRBC: 0 % (ref 0.0–0.2)

## 2018-07-17 LAB — HEPARIN LEVEL (UNFRACTIONATED): Heparin Unfractionated: 1.64 IU/mL — ABNORMAL HIGH (ref 0.30–0.70)

## 2018-07-17 NOTE — Progress Notes (Signed)
RT called to pt room. Pt placed on BIPAP d/t pt stating it was hard to breathe. RT adjusted BIPAP for pt comfort and tolerance. Pt resting and stable. RT will continue to monitor.

## 2018-07-17 NOTE — Progress Notes (Signed)
[place] for heparin Indication: atrial fibrillation  Allergies  Allergen Reactions  . Oxycodone Itching  . Codeine Itching    Patient Measurements: Height: _0  (190.5 cm) Weight: 292 lb 12.3 oz (132.8 kg) IBW/kg (Calculated) : 84.5 Heparin Dosing Weight: 114kg  Vital Signs: Temp: 97 F (36.1 C) (04/17 0404) Temp Source: Axillary (04/17 0404) BP: 135/66 (04/17 0600) Pulse Rate: 59 (04/17 0600)  Labs: Recent Labs    07/15/18 1420 07/15/18 1902 07/15/18 2306 07/16/18 0523 07/17/18 0442  HGB 10.6*  --   --   --  9.8*  HCT 36.8*  --   --   --  33.5*  PLT 160  --   --   --  146*  APTT  --   --   --   --  58*  HEPARINUNFRC  --   --   --   --  1.64*  CREATININE 2.97*  --   --  3.02* 2.83*  TROPONINI 0.13* 0.14* 0.12* 0.09*  --     Estimated Creatinine Clearance: 35.2 mL/min (A) (by C-G formula based on SCr of 2.83 mg/dL (H)).   Medical History: Past Medical History:  Diagnosis Date  . Arthritis   . COPD (chronic obstructive pulmonary disease) ([place])   . DVT, lower extremity ([place])   . Gout   . History of pulmonary embolus (PE)   . Hyperlipidemia   . Hypertension   . Obesity   . Obstructive sleep apnea   . Renal carcinoma [facility])    Assessment: 74 year old male with PAF currently on xarelto (given last night ~2100). Now with HF exacerbation and AKI with scr up to 3. New orders to hold xarelto and start IV heparin in case invasive procedures are needed.   Heparin level was supratherapeutic at 1.64, aPTT came back subtherapeutic at 58, on 1500 units/hr. Will continue to monitor aPTT until correlate. Hgb 9.8, plt 146. No s/sx of bleeding. No infusion issues.    Goal of Therapy:  Heparin level 0.3-0.7 units/ml aPTT 66-102 seconds Monitor platelets by anticoagulation protocol: Yes   Plan:  Increase heparin to 1700 units/hr  Obtain aPTT in 8 hours  Monitor daily aPTT, HL, and for s/sx of bleeding   Antonietta Jewel, PharmD,  BCCCP Clinical Pharmacist  Pager: [phone] Phone: [phone] 07/17/2018,7:25 AM

## 2018-07-17 NOTE — Progress Notes (Signed)
Patient ID: Ralph Powell, male   DOB: Feb 22, 1946, 73 y.o.   MRN: [id number] [facility] Progress Note   Assessment/ Plan:   1. Acute kidney Injury (on chronic kidney disease stage II/history of right nephrectomy): This appears to be hemodynamically mediated in the setting of CHF exacerbation and exacerbated by urinary retention.  Decent urine output with net negative fluid balance overnight in response to current dose of furosemide.  Creatinine slightly lower compared to yesterday.  He remains hypervolemic and I recommend continuing the current dose of furosemide at this time.  No indications for renal replacement therapy. 2.  Acute exacerbation of congestive heart failure (diastolic):  Ongoing diuretic therapy for volume unloading, remains hypervolemic. 3.  Acute encephalopathy: With prominent component of hypercarbia/CO2 retention that is likely the driving etiology in this patient with underlying COPD. 4.  Hypertension: Blood pressure under acceptable control, monitor with ongoing diuresis.  Subjective:   Overnight events noted, transferred to stepdown unit and started on BiPAP.   Objective:   BP 135/66   Pulse (!) 59   Temp (!) 97 F (36.1 C) (Axillary)   Resp 18   Ht _0  (1.905 m)   Wt 132.8 kg   SpO2 96%   BMI 36.59 kg/m   Intake/Output Summary (Last 24 hours) at 07/17/2018 0746 Last data filed at 07/17/2018 0405 Gross per 24 hour  Intake 600 ml  Output 2875 ml  Net -2275 ml   Weight change: 0 kg  Physical Exam: Gen: Somnolent, resting comfortably in bed CVS: Pulse regular bradycardia, S1 and S2 normal Resp: Anteriorly coarse breath sounds bilaterally without distinct rales Abd: Soft, obese, nontender Ext: 2-3+ lower extremity edema  Imaging: US Renal  Result Date: 07/15/2018 CLINICAL DATA:  Acute kidney injury EXAM: RENAL / URINARY TRACT ULTRASOUND COMPLETE COMPARISON:  None. FINDINGS: Right Kidney: Right nephrectomy. Left Kidney: Renal measurements:  12.4 x 6.1 x 7.1 cm = volume: 282 mL. Echogenicity within normal limits. No mass or hydronephrosis visualized. Bladder: Appears normal for degree of bladder distention. IMPRESSION: 1. Prior right nephrectomy. 2. Normal left kidney. Electronically Signed   By: Kathreen Devoid   On: 07/15/2018 19:55   Dg Chest Portable 1 View  Result Date: 07/15/2018 CLINICAL DATA:  Shortness of breath and tremors for 1 day. History of COPD. EXAM: PORTABLE CHEST 1 VIEW COMPARISON:  07/13/2018 FINDINGS: The cardiomediastinal silhouette is unchanged with the left heart border remaining partially obscured. Small to moderate left and small right pleural effusions and left greater than right basilar lung opacities are unchanged. No edema or pneumothorax is identified. No acute osseous abnormality is seen. IMPRESSION: Unchanged left larger than right pleural effusions and basilar lung opacities, at least in part atelectasis though pneumonia is also possible in the left lung base. Electronically Signed   By: Logan Bores M.D.   On: 07/15/2018 14:09    Labs: BMET Recent Labs  Lab 07/10/18 0852 07/15/18 1420 07/16/18 0523 07/17/18 0442  NA 143 140 139 140  K 4.5 4.8 4.9 4.5  CL 100 100 99 98  CO2 _1 32  GLUCOSE 101* 105* 94 81  BUN 33* 39* 42* 44*  CREATININE 2.09* 2.97* 3.02* 2.83*  CALCIUM 8.6 8.4* 8.2* 8.1*  PHOS  --   --  5.8* 4.8*   CBC Recent Labs  Lab 07/10/18 0852 07/15/18 1420 07/17/18 0442  WBC 6.4 8.2 6.7  NEUTROABS  --  5.7  --   HGB 10.7* 10.6* 9.8*  HCT  32.1* 36.8* 33.5*  MCV 98* 106.7* 103.7*  PLT 144* 160 146*    Medications:    . allopurinol  100 mg Oral Daily  . furosemide  60 mg Intravenous Q12H  . sodium chloride flush  3 mL Intravenous Q12H  . tamsulosin  0.4 mg Oral Daily  . thiamine  100 mg Oral Daily   Elmarie Shiley, MD 07/17/2018, 7:46 AM

## 2018-07-17 NOTE — Progress Notes (Signed)
Advanced Heart Failure Rounding Note  PCP-Cardiologist: Jenean Lindau, MD   Subjective:    Agitated earlier this morning. ABG showed Ph 7.3, CO2 72, O2 85  Placed on Bipap. Now off BIPAP and more alert. PCO2 still elevated though.   Making good urine on IV lasix. Weight down 4 pounds  Denies CP or SOB. No orthopnea or PND   Objective:   Weight Range: 132.8 kg Body mass index is 36.59 kg/m.   Vital Signs:   Temp:  [97 F (36.1 C)-98.8 F (37.1 C)] 98.1 F (36.7 C) (04/17 0827) Pulse Rate:  [37-71] 58 (04/17 0827) Resp:  [14-22] 16 (04/17 0827) BP: (93-142)/(45-68) 128/57 (04/17 0827) SpO2:  [90 %-99 %] 97 % (04/17 0827) FiO2 (%):  [60 %-80 %] 80 % (04/17 0817) Weight:  [132.8 kg-134.6 kg] 132.8 kg (04/17 0520)    Weight change: Filed Weights   07/16/18 0512 07/16/18 1800 07/17/18 0520  Weight: 134.6 kg 134.6 kg 132.8 kg    Intake/Output:   Intake/Output Summary (Last 24 hours) at 07/17/2018 1051 Last data filed at 07/17/2018 0828 Gross per 24 hour  Intake 272 ml  Output 3800 ml  Net -3528 ml      Physical Exam    General:  Chronically ill appearing. NAD  HEENT: Normal Neck: Supple. JVP to ear . Carotids 2+ bilat; no bruits. No lymphadenopathy or thyromegaly appreciated. Cor: PMI nondisplaced. Regular rate & rhythm. 2/6 TR Lungs: Clear anteriorly  Abdomen: Obese Soft, nontender, + distended. No hepatosplenomegaly. No bruits or masses. Good bowel sounds. Extremities: No cyanosis, clubbing, rash, 2-3+ edema Neuro: Alert & orientedx3, cranial nerves grossly intact. moves all 4 extremities w/o difficulty. Affect pleasant   Telemetry   Sinus Rhythm/Sinus Loletha Grayer 50-60s  Personally reviewed   EKG   na   Labs    CBC Recent Labs    07/15/18 1420 07/17/18 0442  WBC 8.2 6.7  NEUTROABS 5.7  --   HGB 10.6* 9.8*  HCT 36.8* 33.5*  MCV 106.7* 103.7*  PLT 160 709*   Basic Metabolic Panel Recent Labs    07/16/18 0523 07/17/18 0442  NA 139 140   K 4.9 4.5  CL 99 98  CO2 29 32  GLUCOSE 94 81  BUN 42* 44*  CREATININE 3.02* 2.83*  CALCIUM 8.2* 8.1*  MG 2.3  --   PHOS 5.8* 4.8*   Liver Function Tests Recent Labs    07/15/18 1420 07/17/18 0442  AST 19  --   ALT 12  --   ALKPHOS 53  --   BILITOT 0.7  --   PROT 6.3*  --   ALBUMIN 3.5 3.1*   No results for input(s): LIPASE, AMYLASE in the last 72 hours. Cardiac Enzymes Recent Labs    07/15/18 1902 07/15/18 2306 07/16/18 0523  TROPONINI 0.14* 0.12* 0.09*    BNP: BNP (last 3 results) Recent Labs    07/15/18 1420  BNP 1,038.4*    ProBNP (last 3 results) Recent Labs    07/10/18 0852  PROBNP 1,888*     D-Dimer No results for input(s): DDIMER in the last 72 hours. Hemoglobin A1C No results for input(s): HGBA1C in the last 72 hours. Fasting Lipid Panel No results for input(s): CHOL, HDL, LDLCALC, TRIG, CHOLHDL, LDLDIRECT in the last 72 hours. Thyroid Function Tests No results for input(s): TSH, T4TOTAL, T3FREE, THYROIDAB in the last 72 hours.  Invalid input(s): FREET3  Other results:   Imaging     No  results found.   Medications:     Scheduled Medications: . allopurinol  100 mg Oral Daily  . furosemide  60 mg Intravenous Q12H  . sodium chloride flush  3 mL Intravenous Q12H  . tamsulosin  0.4 mg Oral Daily  . thiamine  100 mg Oral Daily     Infusions: . heparin 1,700 Units/hr (07/17/18 0801)     PRN Medications:  haloperidol lactate    Patient Profile   Complicated 73 y/o male with multiple medical problems including obesity, chronic respiratory failure due to COPD/untreated OSA/OHS, CKD s/p right nephrectomy and PAF s/p recent DC-CV and now on Multaq/Xarelto. Admitted with severe RV vaiilure complicated by AKI and hypercarbic respiratory failure   Assessment/Plan  1.  Acute Diastolic CHF exacerbation with R>>L symptoms: --Echocardiogram , 07/16/2018 with LVEF of greater than 65% with right ventricular volume and pressure  overload and no evidence of wall motion abnormalities. The right atrium is severely dilated. Volume status remains elevated. Good UOP noted. Weight down 4 pounds.   -Continue IV lasix 60 mg twice a day.  -Suspect he has at least 25-30 pounds to go - Will need RHC when fully diuresed  - place UNNA boots  2.  Acute kidney injury with underlying CKD stage II with h/o previous nephrectomy due to RCC: -Prior creatinine noted from 2018 with a baseline of 1.0-1.2 - Creatinine peaked 3.1.  - Todays creatinine is improved with diuresis 2.8  --Nephrology following. No indication for HD.  -Renal ultrasound with no hydronephrosis  -Foley catheter currently in place per nephrology recommendation given suspected cardiorenal syndrome  3.  Hx of paroxysmal atrial fibrillation: -Recently diagnosis after hospitalization with PAF -Seen in outpatient setting by cardiology -Anticoagulated with Xarelto, now renally dosed at 15 mg daily.   -Multaq and metoprolol on hold secondary to bradycardia. Would not restart Multaq with HF. Will start low dose amio 200 daily   4.  History of COPD: -On home supplemental oxygenation at 3 L nasal cannula -Per primary team  5.  OSA: -Not currently on CPAP  6. Bradycardia: -Multaq and metoprolol discontinued    7. Acute Hypercarbic  Respiratory Failure - ABG today--> Ph 7.3 CO2 72 O2 85 Bicarb 34  - continue bipap as needed    Length of Stay: 2  Glori Bickers, MD  5:52 PM   Advanced Heart Failure Team Pager [phone] (M-F; 7a - 4p)  Please contact [facility] for night-coverage after hours (4p -7a ) and weekends on [email or web address]

## 2018-07-17 NOTE — Progress Notes (Signed)
Pt order for BIPAP is PRN at this time. Pt respiratory status stable at this time and not in need of BIPAP. RT will continue to monitor.

## 2018-07-17 NOTE — Progress Notes (Addendum)
  Date: 07/17/2018  Patient name: Ralph Powell  Medical record number: [id number]  Date of birth: [birth year]   I have seen and evaluated this patient and I have discussed the plan of care with the house staff. Please see their note for complete details. I concur with their findings with the following additions/corrections:   Somewhat improved from yesterday, more alert and cooperative.  Continues on BiPAP 18/8 with FiO2 80%.  Repeat ABG this morning shows slight improvement in his respiratory acidosis from 7.2/83 to 7.3/72.  1.  Acute on likely chronic HFpEF: Appears to be primarily right heart failure likely from longstanding COPD, OSA, obesity.  Appreciate consults from nephrology and heart failure.  After initially poor response to diuretics, good output overnight and so far today.  Still very volume overloaded with JVP to the ear and lower extremity edema. -Continue Foley for strict I/O -Continue IV furosemide 60 mg twice daily -Continue holding metoprolol and dronedarone  2.  Acute on chronic respiratory failure with hypercarbia and hypoxia: Likely multifactorial with underlying COPD, OSA, possible obesity hypoventilation, but primary component is likely acute decompensated heart failure as above. -Continue BiPAP, wean FiO2 as able as long as O2 sat >92%  3.  Acute kidney injury on CKD stage II: Appreciate nephrology consult, response to diuretics improving, also was a component of obstruction as Foley relieved significant amount of urine.  Creatinine slightly improved today to 2.8.  Primary goal is to continue treatment of acute decompensated heart failure to improve perfusion of his solitary kidney  4.  Acute encephalopathy: Still appears consistent with toxic/metabolic encephalopathy.  Hopefully will improve with improvement in his hypercarbia, but I do wonder if uremia is also playing a role.  He is still having muscle twitching and asterixis today.  Electrolytes are actually improved,  ongoing hyperphosphatemia, but better than yesterday.  Perhaps the hyperphosphatemia is causing a low ionized calcium with a normal serum calcium (corrected for albumin).  Asterixis is likely reflective of his encephalopathy.  5.  Paroxysmal atrial fibrillation: Remains bradycardic, still holding metoprolol and dronedarone.  Lenice Pressman, M.D., Ph.D. 07/17/2018, 12:07 PM

## 2018-07-17 NOTE — Progress Notes (Signed)
   Subjective: Patient was seen on BiPAP this morning. Per RN he was more oriented this morning and less combative. Mostly somnolent during evaluation, but arousable.  Objective:  Vital signs in last 24 hours: Vitals:   07/17/18 0353 07/17/18 0404 07/17/18 0520 07/17/18 0600  BP:  (!) 110/49  135/66  Pulse: (!) 46 (!) 42  (!) 59  Resp: _0 Temp:  (!) 97 F (36.1 C)    TempSrc:  Axillary    SpO2: 95% 96%  96%  Weight:   132.8 kg   Height:       Physical Exam Gen: seen resting comfortably on BiPAP, restraints no longer in place CV: RRR, no murmurs rubs or gallops Pulm: no crackles or rales, mild upper expiratory wheezing, on BiPAP Ext: bilateral 2+ pitting LE edema, myoclonus and asterixis of bilateral UE  Assessment/Plan:  Principal Problem:   Heart failure (HCC) Active Problems:   AKI (acute kidney injury) ([place])   Acute encephalopathy   Acute on chronic diastolic (congestive) heart failure (HCC)   Acute cor pulmonale (HCC)  Mr. Khadim Lundberg is a 73 y.o male with a history of paroxysmal atrial fibrillation, DVT, PE, COPD on 3L supplemental oxygen, HTN, obseity and history of renal cell carcinoma with a right nephrectomy presenting to the ED due to decreased urinary output and AKI. Found to have elevated BNP, new RBBB and significant weight gain.   Acute Diastolic CHF exacerbation: Echo shows EF >65%, normal LV function, no regional wall abnormalities, right atrium severely dilated, and evidence of pulmonary HTN with elevated RV volume and pressure elevated. He was placed on a foley catheter yesterday and had a good urinary response after aggressive IV diuretics. Weight down by 2 kg, 132.8 kg today. Continue with IV diuretics, 60 mg furosemide BID.  - Cardiology on board, appreciate recommendations - IV furosemide 60 mg BID - Cardiac monitoring  - Daily weights  - Strict ins &outs   AKI: Likely due to hypotension in the setting of CHF exacerbation and possible  obstruction. Cr 2.83, baseline below one. Foley catheter placed yesterday, urine output ~2.8L in the last 24 hours.  - Nephrology on board, appreciate recommendations. - IV furosemide 60 mg BID  - Daily BMP - Avoid nephrotoxic agents   Acute on Chronic Hypercarbic Respiratory failure: with a history of COPD and PE. ABG yesterday showed pH 7.23 and pCO2 of 81. He was started on BiPAP and slightly improved this morning, pH 7.29 and pCO2 71.9.  - Continue BiPAP  Acute encephalopathy: Unclear etiology but most likely due to metabolic abnormalities. Respiratory failure may be a component. He continues to have bilateral UE myoclonus and asterixis, no focal deficits. Electrolytes wnl. Spoke with family yesterday and wife confirmed his last drink was in January 2020.  - Am renal function panel and Mg   Hypertension: He was hypotensive on admission but pressures have improved. 126/60. Plan is to avoid hypotensive episodes.  Bradycardia: HR is remaining in the 50's. Discontinued dronedarone. Will continue to monitor. If he converts back into atrial fibrillation will avoid rate controlling agents.  Paroxysmal Atrial Fibrillation: Currently in sinus rhythm. Holding xarelto for heparin  - Heparin per pharmacy  - Hold dronedarone and metoprolol  Dispo: Anticipated discharge pending clinical improvement.    ,  N, DO 07/17/2018, 7:16 AM Pager: [phone]

## 2018-07-17 NOTE — TOC Initial Note (Addendum)
Transition of Care [facility]) - Initial/Assessment Note    Patient Details  Name: Ralph Powell MRN: [id number] Date of Birth: 09-Nov-1945  [facility]) CM/SW Contact:    Maryclare Labrador, RN Phone Number: 07/17/2018, 3:25 PM  Clinical Narrative:       PTA from home with wife.  Per wife within the last 3 weeks pt has had a significant decline in mobility - pt was requiring assistance for all ADLs.  Pt previously had HH with [facility] however was discharged - per wife at the time of [facility] discharge pt was functional.  Wife would like to resume services with [facility], PT and OT).  [facility] informed of pending referral.  CM requested  orders.  Pt weighs daily and attempts to adhere to low salt diet.  Pt has a walker in the home, wife will provide 24/7 supervision at discharge and wife will transport pt home via private vehicle.              Expected Discharge Plan: [place]     Patient Goals and CMS Choice Patient states their goals for this hospitalization and ongoing recovery are:: (Pt remains confused - CM spoke with wife Carlena Sax)   Choice offered to / list presented to : Spouse  Expected Discharge Plan and Services Expected Discharge Plan: [place]   Discharge Planning Services: CM Consult Post Acute Care Choice: [place] arrangements for the past 2 months: Single Family Home                          Prior Living Arrangements/Services Living arrangements for the past 2 months: Single Family Home Lives with:: Spouse Patient language and need for interpreter reviewed:: Yes Do you feel safe going back to the place where you live?: Yes(Per wife, she feels safe for pt to come home with home health)      Need for Family Participation in Patient Care: Yes (Comment) Care giver support system in place?: Yes (comment) Current home services: DME(home oxygen, 5 liters baseline supplied by [facility] Patient) Criminal Activity/Legal  Involvement Pertinent to Current Situation/Hospitalization: No - Comment as needed  Activities of Daily Living      Permission Sought/Granted   Permission granted to share information with : Yes, Verbal Permission Granted     Permission granted to share info w AGENCY: [facility]        Emotional Assessment           Psych Involvement: No (comment)  Admission diagnosis:  AKI (acute kidney injury) ([place]) [N17.9] Acute renal failure, unspecified acute renal failure type ([place]) [N17.9] Other hypervolemia [E87.79] Patient Active Problem List   Diagnosis Date Noted  . Acute encephalopathy 07/16/2018  . Acute on chronic diastolic (congestive) heart failure ([place])   . Acute cor pulmonale (HCC)   . Heart failure ([place]) 07/15/2018  . AKI (acute kidney injury) ([place]) 07/15/2018  . History of pulmonary embolism 06/09/2018  . Paroxysmal atrial fibrillation ([place]) 06/09/2018  . History of DVT (deep vein thrombosis) 06/09/2018  . OSA (obstructive sleep apnea) 10/03/2016  . Other appendicitis 10/01/2016  . History of renal cell cancer 09/28/2016  . SVT (supraventricular tachycardia) ([place]) 09/25/2016  . Alcohol withdrawal delirium, acute, hyperactive ([place]) 09/24/2016  . Acute pulmonary embolism ([place]) 09/23/2016  . Acute deep vein thrombosis (DVT) of both lower extremities ([place]) 09/22/2016  . Acute hyponatremia 09/21/2016  . Anemia 09/21/2016  .  Bilateral leg edema 09/21/2016  . SOB (shortness of breath) 09/21/2016  . Abnormal liver function tests 04/26/2014  . Alcohol abuse 04/20/2013  . COPD (chronic obstructive pulmonary disease) ([place]) 04/20/2013  . Renal cell cancer ([place]) 04/20/2013  . Other specified disorders of kidney and ureter 03/11/2013   PCP:  Townsend Roger, MD Pharmacy:   [facility] [address] [place] [address] Phone: [phone] Fax: [phone]     Social Determinants of Health (SDOH) Interventions     Readmission Risk Interventions No flowsheet data found.

## 2018-07-17 NOTE — Progress Notes (Signed)
Physical Therapy Treatment Patient Details Name: Ralph Powell MRN: [id number] DOB: October 16, 1945 Today's Date: 07/17/2018    History of Present Illness [name] is a 73 y.o male with a history of paroxysmal atrial fibrillation, DVT, PE, COPD on 3L supplemental oxygen, HTN, obseity and history of renal cell carcinoma with a right nephrectomy presenting to the ED due to confusion and decreased urinary output.  Work up includes CHF and AKI    PT Comments    Completed a resisted Upper and LE exercise program with pt due to unable to get OOB.   Follow Up Recommendations  Home health PT;Supervision/Assistance - 24 hour     Equipment Recommendations  Other (comment);None recommended by PT    Recommendations for Other Services       Precautions / Restrictions Precautions Precautions: Fall    Mobility  Bed Mobility               General bed mobility comments: On Bipap, unable to get OOB right now.  Transfers                 General transfer comment: Unable to get OOB now due to on BIPAP  Ambulation/Gait                 Stairs             Wheelchair Mobility    Modified Rankin (Stroke Patients Only)       Balance                                            Cognition Arousal/Alertness: Awake/alert Behavior During Therapy: WFL for tasks assessed/performed Overall Cognitive Status: No family/caregiver present to determine baseline cognitive functioning                                 General Comments: pt fielded questions and followed simple commands well      Exercises General Exercises - Upper Extremity Elbow Flexion: Strengthening;Both;15 reps;Supine Elbow Extension: AROM;Strengthening;Both;15 reps;Supine(resisted gross extension) General Exercises - Lower Extremity Heel Slides: AROM;Strengthening;Both;10 reps;Supine(graded resistance in extension) Hip ABduction/ADduction: AROM;Both;15  reps;Supine Straight Leg Raises: AROM;AAROM;Both;10 reps;Supine Other Exercises Other Exercises: punching to the sky, alternating x10 reps bil    General Comments General comments (skin integrity, edema, etc.): VSS,  sats 92-95% on bipap, HR in the 70's overall during exercise.      Pertinent Vitals/Pain Pain Assessment: Faces Faces Pain Scale: No hurt    Home Living                      Prior Function            PT Goals (current goals can now be found in the care plan section) Acute Rehab PT Goals Patient Stated Goal: unable to participate PT Goal Formulation: With patient Time For Goal Achievement: 07/30/18 Potential to Achieve Goals: Good Progress towards PT goals: Progressing toward goals    Frequency    Min 3X/week      PT Plan Current plan remains appropriate    Co-evaluation              AM-PAC PT "6 Clicks" Mobility   Outcome Measure  Help needed turning from your back to your side while in a flat bed without using bedrails?:  A Little Help needed moving from lying on your back to sitting on the side of a flat bed without using bedrails?: A Little Help needed moving to and from a bed to a chair (including a wheelchair)?: A Little Help needed standing up from a chair using your arms (e.g., wheelchair or bedside chair)?: A Little Help needed to walk in hospital room?: A Little Help needed climbing 3-5 steps with a railing? : A Lot 6 Click Score: 17    End of Session   Activity Tolerance: Patient tolerated treatment well;Patient limited by fatigue Patient left: in bed;with call bell/phone within reach;with bed alarm set Nurse Communication: Mobility status PT Visit Diagnosis: Unsteadiness on feet (R26.81);Other abnormalities of gait and mobility (R26.89);Difficulty in walking, not elsewhere classified (R26.2)     Time: 1540-0867 PT Time Calculation (min) (ACUTE ONLY): 13 min  Charges:  $Therapeutic Exercise: 8-22 mins                      07/17/2018  Donnella Sham, PT Acute Rehabilitation Services [phone]  (pager) [phone]  (office)   Ralph Powell 07/17/2018, 2:20 PM

## 2018-07-17 NOTE — Progress Notes (Signed)
Orthopedic Tech Progress Note Patient Details:  Ralph Powell 08/28/1945 [id number]  Ortho Devices Type of Ortho Device: Haematologist Ortho Device/Splint Interventions: Adjustment, Application, Ordered   Post Interventions Patient Tolerated: Well Instructions Provided: Care of device, Adjustment of device   Melony Overly T 07/17/2018, 8:49 PM

## 2018-07-17 NOTE — Progress Notes (Signed)
Critical ABG results called to RN

## 2018-07-17 NOTE — Progress Notes (Signed)
Patient was taken off BIPAP by RN.

## 2018-07-17 NOTE — Progress Notes (Signed)
[place] for heparin Indication: atrial fibrillation  Allergies  Allergen Reactions  . Oxycodone Itching  . Codeine Itching    Patient Measurements: Height: _0  (190.5 cm) Weight: 292 lb 12.3 oz (132.8 kg) IBW/kg (Calculated) : 84.5 Heparin Dosing Weight: 114kg  Vital Signs: Temp: 98.7 F (37.1 C) (04/17 1139) Temp Source: Axillary (04/17 1139) BP: 137/69 (04/17 1139) Pulse Rate: 66 (04/17 1139)  Labs: Recent Labs    07/15/18 1420 07/15/18 1902 07/15/18 2306 07/16/18 0523 07/17/18 0442 07/17/18 1433  HGB 10.6*  --   --   --  9.8*  --   HCT 36.8*  --   --   --  33.5*  --   PLT 160  --   --   --  146*  --   APTT  --   --   --   --  58* 74*  HEPARINUNFRC  --   --   --   --  1.64*  --   CREATININE 2.97*  --   --  3.02* 2.83*  --   TROPONINI 0.13* 0.14* 0.12* 0.09*  --   --     Estimated Creatinine Clearance: 35.2 mL/min (A) (by C-G formula based on SCr of 2.83 mg/dL (H)).   Medical History: Past Medical History:  Diagnosis Date  . Arthritis   . COPD (chronic obstructive pulmonary disease) ([place])   . DVT, lower extremity ([place])   . Gout   . History of pulmonary embolus (PE)   . Hyperlipidemia   . Hypertension   . Obesity   . Obstructive sleep apnea   . Renal carcinoma [facility])    Assessment: 73 year old male with PAF currently on xarelto (given last night ~2100). Now with HF exacerbation and AKI with scr up to 3. New orders to hold xarelto and start IV heparin in case invasive procedures are needed.   Heparin level was supratherapeutic at 1.64, aPTT now therapeutic at 74s, on 1700 units/hr. Will continue to monitor aPTT until correlate. Hgb 9.8, plt 146. No s/sx of bleeding. No infusion issues.    Goal of Therapy:  Heparin level 0.3-0.7 units/ml aPTT 66-102 seconds Monitor platelets by anticoagulation protocol: Yes   Plan:  Continue heparin at 1700 units/hr  Monitor daily aPTT, HL, and for s/sx of bleeding   Erin Hearing PharmD., BCPS Clinical Pharmacist 07/17/2018 4:40 PM

## 2018-07-18 DIAGNOSIS — I5031 Acute diastolic (congestive) heart failure: Secondary | ICD-10-CM

## 2018-07-18 LAB — RENAL FUNCTION PANEL
Albumin: 3.4 g/dL — ABNORMAL LOW (ref 3.5–5.0)
Anion gap: 13 (ref 5–15)
BUN: 40 mg/dL — ABNORMAL HIGH (ref 8–23)
CO2: 35 mmol/L — ABNORMAL HIGH (ref 22–32)
Calcium: 8.6 mg/dL — ABNORMAL LOW (ref 8.9–10.3)
Chloride: 96 mmol/L — ABNORMAL LOW (ref 98–111)
Creatinine, Ser: 2.13 mg/dL — ABNORMAL HIGH (ref 0.61–1.24)
GFR calc Af Amer: 35 mL/min — ABNORMAL LOW (ref >60)
GFR calc non Af Amer: 30 mL/min — ABNORMAL LOW (ref >60)
Glucose, Bld: 82 mg/dL (ref 70–99)
Phosphorus: 3.7 mg/dL (ref 2.5–4.6)
Potassium: 4.2 mmol/L (ref 3.5–5.1)
Sodium: 144 mmol/L (ref 135–145)

## 2018-07-18 LAB — CBC
HCT: 32.7 % — ABNORMAL LOW (ref 39.0–52.0)
Hemoglobin: 10.1 g/dL — ABNORMAL LOW (ref 13.0–17.0)
MCH: 31.5 pg (ref 26.0–34.0)
MCHC: 30.9 g/dL (ref 30.0–36.0)
MCV: 101.9 fL — ABNORMAL HIGH (ref 80.0–100.0)
Platelets: 155 10*3/uL (ref 150–400)
RBC: 3.21 MIL/uL — ABNORMAL LOW (ref 4.22–5.81)
RDW: 14.9 % (ref 11.5–15.5)
WBC: 7.4 10*3/uL (ref 4.0–10.5)
nRBC: 0 % (ref 0.0–0.2)

## 2018-07-18 LAB — APTT: aPTT: 81 seconds — ABNORMAL HIGH (ref 24–36)

## 2018-07-18 LAB — HEPARIN LEVEL (UNFRACTIONATED): Heparin Unfractionated: 1 IU/mL — ABNORMAL HIGH (ref 0.30–0.70)

## 2018-07-18 LAB — MAGNESIUM: Magnesium: 1.8 mg/dL (ref 1.7–2.4)

## 2018-07-18 MED ORDER — FUROSEMIDE 10 MG/ML IJ SOLN
60.0000 mg | Freq: Two times a day (BID) | INTRAMUSCULAR | Status: DC
  Administered 2018-07-18 – 2018-07-20 (×5): 60 mg via INTRAVENOUS
  Filled 2018-07-18 (×5): qty 6

## 2018-07-18 MED ORDER — AMIODARONE HCL 200 MG PO TABS
200.0000 mg | ORAL_TABLET | Freq: Every day | ORAL | Status: DC
  Administered 2018-07-18 – 2018-07-22 (×5): 200 mg via ORAL
  Filled 2018-07-18 (×5): qty 1

## 2018-07-18 NOTE — Progress Notes (Signed)
RT placed pt on BIPAP V60 RN aware. Pt tolerating well. Pt settings 16/8 with 60% FIO2. RT will continue to monitor.

## 2018-07-18 NOTE — Progress Notes (Signed)
Patient ID: Ralph Powell, male   DOB: March 14, 1946, 73 y.o.   MRN: [id number] [facility] Progress Note   Assessment/ Plan:   1. Acute kidney Injury (on chronic kidney disease stage II/history of right nephrectomy): This appears to be hemodynamically mediated in the setting of CHF exacerbation and exacerbated by urinary retention.  Excellent urine output in response to furosemide 60 mg IV twice daily with concomitant improvement of renal function; recommend continuing current dose of furosemide and transitioning to oral diuretics in the next 24 to 48 hours based on weight/physical exam findings.  No indications for renal replacement therapy. 2.  Acute exacerbation of congestive heart failure (diastolic): Decent response to diuretics overnight with net -5.2 L fluid balance/weight loss of 12 pounds.  Continue current dose of furosemide given physical exam findings with the goal to transition to oral diuretics in the next 24 to 48 hours. 3.  Acute encephalopathy: Appears to have improved with ongoing volume unloading as well as treatment of hypercarbia with BiPAP. 4.  Hypertension: Blood pressure intermittently elevated with ongoing diuresis, consider adding hydralazine.  Subjective:   Overnight events noted, transferred to stepdown unit and started on BiPAP.   Objective:   BP (!) 165/64 (BP Location: Right Arm)   Pulse 61   Temp 98.9 F (37.2 C) (Oral)   Resp 18   Ht _0  (1.905 m)   Wt 126.3 kg   SpO2 95%   BMI 34.80 kg/m   Intake/Output Summary (Last 24 hours) at 07/18/2018 0739 Last data filed at 07/18/2018 0715 Gross per 24 hour  Intake 693.14 ml  Output 5975 ml  Net -5281.86 ml   Weight change: -8.3 kg  Physical Exam: Gen: Awake, alert and resting comfortably in bed CVS: Pulse regular rhythm, normal rate, S1 and S2 normal Resp: Distant breath sounds bilaterally without distinct rales Abd: Soft, obese, nontender Ext: 2+ lower extremity edema over thighs with wrapped  legs  Imaging: No results found.  Labs: BMET Recent Labs  Lab 07/15/18 1420 07/16/18 0523 07/17/18 0442 07/18/18 0216  NA 140 139 140 144  K 4.8 4.9 4.5 4.2  CL 100 99 98 96*  CO2 31 29 32 35*  GLUCOSE 105* 94 81 82  BUN 39* 42* 44* 40*  CREATININE 2.97* 3.02* 2.83* 2.13*  CALCIUM 8.4* 8.2* 8.1* 8.6*  PHOS  --  5.8* 4.8* 3.7   CBC Recent Labs  Lab 07/15/18 1420 07/17/18 0442 07/18/18 0216  WBC 8.2 6.7 7.4  NEUTROABS 5.7  --   --   HGB 10.6* 9.8* 10.1*  HCT 36.8* 33.5* 32.7*  MCV 106.7* 103.7* 101.9*  PLT 160 146* 155    Medications:    . allopurinol  100 mg Oral Daily  . sodium chloride flush  3 mL Intravenous Q12H  . tamsulosin  0.4 mg Oral Daily  . thiamine  100 mg Oral Daily   Elmarie Shiley, MD 07/18/2018, 7:39 AM

## 2018-07-18 NOTE — Progress Notes (Addendum)
Advanced Heart Failure Rounding Note  PCP-Cardiologist: Jenean Lindau, MD   Subjective:    Wore BIPAP overnight. More alert.   Continues to diurese well on IV lasix. Weight down 14 pounds (!) overnight  More alert. Breathing better. Denies CP or orthopnea. Still edematous.   Objective:   Weight Range: 126.3 kg Body mass index is 34.8 kg/m.   Vital Signs:   Temp:  [98 F (36.7 C)-98.9 F (37.2 C)] 98.1 F (36.7 C) (04/18 0821) Pulse Rate:  [59-71] 60 (04/18 0821) Resp:  [16-24] 16 (04/18 0821) BP: (119-165)/(58-86) 132/63 (04/18 0821) SpO2:  [91 %-96 %] 91 % (04/18 0821) FiO2 (%):  [60 %] 60 % (04/17 2230) Weight:  [126.3 kg] 126.3 kg (04/18 0500)    Weight change: Filed Weights   07/16/18 1800 07/17/18 0520 07/18/18 0500  Weight: 134.6 kg 132.8 kg 126.3 kg    Intake/Output:   Intake/Output Summary (Last 24 hours) at 07/18/2018 0956 Last data filed at 07/18/2018 0823 Gross per 24 hour  Intake 916.14 ml  Output 5150 ml  Net -4233.86 ml      Physical Exam    General:  Sitting up in bed . No resp difficulty more alert  HEENT: normal Neck: supple. JVP to jaw Carotids 2+ bilat; no bruits. No lymphadenopathy or thryomegaly appreciated. Cor: PMI nondisplaced. Regular rate & rhythm. No rubs, gallops or murmurs. Lungs: clear Abdomen: obese soft, nontender, + distended. No hepatosplenomegaly. No bruits or masses. Good bowel sounds. Extremities: no cyanosis, clubbing, rash, 1-2+ edema + UNNA boots Neuro: alert & orientedx3, cranial nerves grossly intact. moves all 4 extremities w/o difficulty. Affect pleasant   Telemetry   Sinus Rhythm/Sinus Loletha Grayer 50-60s  Personally reviewed   EKG   na   Labs    CBC Recent Labs    07/15/18 1420 07/17/18 0442 07/18/18 0216  WBC 8.2 6.7 7.4  NEUTROABS 5.7  --   --   HGB 10.6* 9.8* 10.1*  HCT 36.8* 33.5* 32.7*  MCV 106.7* 103.7* 101.9*  PLT 160 146* 681   Basic Metabolic Panel Recent Labs    07/16/18 0523  07/17/18 0442 07/18/18 0216  NA 139 140 144  K 4.9 4.5 4.2  CL 99 98 96*  CO2 29 32 35*  GLUCOSE 94 81 82  BUN 42* 44* 40*  CREATININE 3.02* 2.83* 2.13*  CALCIUM 8.2* 8.1* 8.6*  MG 2.3  --  1.8  PHOS 5.8* 4.8* 3.7   Liver Function Tests Recent Labs    07/15/18 1420 07/17/18 0442 07/18/18 0216  AST 19  --   --   ALT 12  --   --   ALKPHOS 53  --   --   BILITOT 0.7  --   --   PROT 6.3*  --   --   ALBUMIN 3.5 3.1* 3.4*   No results for input(s): LIPASE, AMYLASE in the last 72 hours. Cardiac Enzymes Recent Labs    07/15/18 1902 07/15/18 2306 07/16/18 0523  TROPONINI 0.14* 0.12* 0.09*    BNP: BNP (last 3 results) Recent Labs    07/15/18 1420  BNP 1,038.4*    ProBNP (last 3 results) Recent Labs    07/10/18 0852  PROBNP 1,888*     D-Dimer No results for input(s): DDIMER in the last 72 hours. Hemoglobin A1C No results for input(s): HGBA1C in the last 72 hours. Fasting Lipid Panel No results for input(s): CHOL, HDL, LDLCALC, TRIG, CHOLHDL, LDLDIRECT in the last 72 hours.  Thyroid Function Tests No results for input(s): TSH, T4TOTAL, T3FREE, THYROIDAB in the last 72 hours.  Invalid input(s): FREET3  Other results:   Imaging    No results found.   Medications:     Scheduled Medications: . allopurinol  100 mg Oral Daily  . furosemide  60 mg Intravenous BID  . sodium chloride flush  3 mL Intravenous Q12H  . tamsulosin  0.4 mg Oral Daily  . thiamine  100 mg Oral Daily    Infusions: . heparin 1,700 Units/hr (07/18/18 0715)    PRN Medications: haloperidol lactate    Patient Profile   Complicated 73 y/o male with multiple medical problems including obesity, chronic respiratory failure due to COPD/untreated OSA/OHS, CKD s/p right nephrectomy and PAF s/p recent DC-CV and now on Multaq/Xarelto. Admitted with severe RV vaiilure complicated by AKI and hypercarbic respiratory failure   Assessment/Plan  1 .  Acute Diastolic CHF exacerbation  with R>>L symptoms: --Echocardiogram , 07/16/2018 with LVEF of greater than 65% with right ventricular volume and pressure overload and no evidence of wall motion abnormalities. The right atrium is severely dilated. - Diuresing very well. Weight down 18 pounds.   -Suspect he has at least 15  pounds to go. - Continue IV lasix 60 mg twice a day.  - Will need RHC when fully diuresed  - ContinueUNNA boots  2.  Acute kidney injury with underlying CKD stage II with h/o previous nephrectomy due to [place]: - Likely due to cardiorenal/renal congestion - Prior creatinine noted from 2018 with a baseline of 1.0-1.2 - Creatinine peaked 3.1.  - Todays creatinine is improved with diuresis 2.8 -. 2.1 - Nephrology following. No indication for HD.  - Renal ultrasound with no hydronephrosis  - Foley catheter currently in place per nephrology recommendation given suspected cardiorenal syndrome - Continue diuresis   3.  Hx of paroxysmal atrial fibrillation: -Recently diagnosis after hospitalization with PAF -Seen in outpatient setting by cardiology -Anticoagulated with Xarelto, now renally dosed at 15 mg daily.   -Multaq and metoprolol on hold secondary to bradycardia. Would not restart Multaq with HF. - Now on low dose amio 200 daily as he will likely not tolerate recurrent AF well   4.  History of COPD: -On home supplemental oxygenation at 3 L nasal cannula -Per primary team  5.  OSA: -Not currently on CPAP  6. Bradycardia: - stabe see discussion above  7. Acute Hypercarbic  Respiratory Failure - continue bipap as needed - will need aggressive rx of OSA/OHS    Length of Stay: 3  Glori Bickers, MD  9:56 AM   Advanced Heart Failure Team Pager [phone] (M-F; 7a - 4p)  Please contact [facility] for night-coverage after hours (4p -7a ) and weekends on [email or web address]

## 2018-07-18 NOTE — Progress Notes (Signed)
[place] for heparin Indication: atrial fibrillation  Allergies  Allergen Reactions  . Oxycodone Itching  . Codeine Itching    Patient Measurements: Height: 6' 3" (190.5 cm) Weight: 278 lb 7.1 oz (126.3 kg) IBW/kg (Calculated) : 84.5 Heparin Dosing Weight: 114kg  Vital Signs: Temp: 98.4 F (36.9 C) (04/18 1151) Temp Source: Oral (04/18 1151) BP: 150/66 (04/18 1151) Pulse Rate: 68 (04/18 1151)  Labs: Recent Labs    07/15/18 1420 07/15/18 1902 07/15/18 2306 07/16/18 0523 07/17/18 0442 07/17/18 1433 07/18/18 0216  HGB 10.6*  --   --   --  9.8*  --  10.1*  HCT 36.8*  --   --   --  33.5*  --  32.7*  PLT 160  --   --   --  146*  --  155  APTT  --   --   --   --  58* 74* 81*  HEPARINUNFRC  --   --   --   --  1.64*  --  1.00*  CREATININE 2.97*  --   --  3.02* 2.83*  --  2.13*  TROPONINI 0.13* 0.14* 0.12* 0.09*  --   --   --     Estimated Creatinine Clearance: 45.5 mL/min (A) (by C-G formula based on SCr of 2.13 mg/dL (H)).   Medical History: Past Medical History:  Diagnosis Date  . Arthritis   . COPD (chronic obstructive pulmonary disease) ([place])   . DVT, lower extremity ([place])   . Gout   . History of pulmonary embolus (PE)   . Hyperlipidemia   . Hypertension   . Obesity   . Obstructive sleep apnea   . Renal carcinoma [facility])    Assessment: 73 year old male with PAF currently on xarelto (given last night ~2100). Now with HF exacerbation and AKI with scr up to 3. New orders to hold xarelto and start IV heparin in case invasive procedures are needed.   Heparin level remains falsely supratherapeutic at 1.00, aPTT therapeutic at 81s, on 1700 units/hr. Will continue to monitor aPTT until correlating. Hgb 10.1, plt 155. No s/sx of bleeding or issues with infusion reported by nursing.   Goal of Therapy:  Heparin level 0.3-0.7 units/ml aPTT 66-102 seconds Monitor platelets by anticoagulation protocol: Yes   Plan:  Continue  heparin at 1700 units/hr  Monitor daily aPTT, HL, and for s/sx of bleeding    Claiborne Billings, PharmD PGY2 Cardiology Pharmacy Resident Please check AMION for all Pharmacist numbers by unit 07/18/2018 1:05 PM

## 2018-07-18 NOTE — Plan of Care (Signed)
  Problem: Education: Goal: Knowledge of General Education information will improve Description Including pain rating scale, medication(s)/side effects and non-pharmacologic comfort measures Outcome: Progressing   Problem: Health Behavior/Discharge Planning: Goal: Ability to manage health-related needs will improve Outcome: Progressing   Problem: Clinical Measurements: Goal: Ability to maintain clinical measurements within normal limits will improve Outcome: Progressing Goal: Will remain free from infection Outcome: Progressing Goal: Diagnostic test results will improve Outcome: Progressing Goal: Respiratory complications will improve Outcome: Progressing Goal: Cardiovascular complication will be avoided Outcome: Progressing   Problem: Activity: Goal: Risk for activity intolerance will decrease Outcome: Progressing   Problem: Nutrition: Goal: Adequate nutrition will be maintained Outcome: Progressing   Problem: Coping: Goal: Level of anxiety will decrease Outcome: Progressing   Problem: Elimination: Goal: Will not experience complications related to bowel motility Outcome: Progressing Goal: Will not experience complications related to urinary retention Outcome: Progressing   Problem: Pain Managment: Goal: General experience of comfort will improve Outcome: Progressing   Problem: Safety: Goal: Ability to remain free from injury will improve Outcome: Progressing   Problem: Skin Integrity: Goal: Risk for impaired skin integrity will decrease Outcome: Progressing   Problem: Education: Goal: Ability to demonstrate management of disease process will improve Outcome: Progressing Goal: Ability to verbalize understanding of medication therapies will improve Outcome: Progressing Goal: Individualized Educational Video(s) Outcome: Progressing   Problem: Activity: Goal: Capacity to carry out activities will improve Outcome: Progressing   Problem: Cardiac: Goal:  Ability to achieve and maintain adequate cardiopulmonary perfusion will improve Outcome: Progressing

## 2018-07-18 NOTE — Progress Notes (Signed)
Subjective: Ralph Powell is alert and oriented on exam today. He was sitting up and seen on 6L supplemental oxygen. He states he feels fine this morning. Denies any pain and feels that his breathing is doing well. States he feels his legs are still swollen. He had a good understanding of why he came to the hospital initially. He states prior to January he was wearing home oxygen intermittently, mostly at night when he slept. Since his last hospitalization in January he has been wearing it continuously. It was explained to him why he was admitted and the plan. He expressed understanding. All questions and concerns addressed.   Objective:  Vital signs in last 24 hours: Vitals:   07/17/18 2304 07/18/18 0000 07/18/18 0347 07/18/18 0500  BP: (!) 144/69 (!) 146/69 (!) 165/64   Pulse: 61 63 60 61  Resp: 19 (!) 22 (!) 21 18  Temp: 98.2 F (36.8 C) 98.4 F (36.9 C) 98.9 F (37.2 C)   TempSrc: Oral Oral Oral   SpO2: 94% 94% 95% 95%  Weight:    126.3 kg  Height:       Physical Exam Gen: seen awake and alert sitting up in bed, no distress on 6L supplemental oxygen CV: RRR, no murmurs rubs or gallops Pulm: CTA bilaterally, normal effort, on 6L decreased to 5L on exam and saturating 90-91% Abdomen: soft, non distended, bowel sounds present, no ttp Ext: bilateral LE 2+ pitting edema, wrapped in ACE wrap  Assessment/Plan:  Principal Problem:   Heart failure (HCC) Active Problems:   AKI (acute kidney injury) (HCC)   Acute encephalopathy   Acute on chronic diastolic (congestive) heart failure (HCC)   Acute cor pulmonale (HCC)   Acute renal failure [facility])  Ralph Powell is a 73 y.o male with a history of paroxysmal atrial fibrillation, DVT, PE, COPD on 3L supplemental oxygen, HTN, obseity and history of renal cell carcinoma with a right nephrectomy presenting to the ED due to decreased urinary output and AKI. Found to have elevated BNP, new RBBB and significant weight gain.   Acute Diastolic  CHF exacerbation:He continues to have a good urinary response to IV diuretics. Weight down by ~6kg, 126 kg today. Plan is to continue IV diuretics. Dry weight is closer to 262 per clinic visits.  - Cardiology on board, appreciate recommendations - IV furosemide60 mg BID - Cardiac monitoring  - Daily weights  - Strict ins &outs   AKI on CKD II/Hx of right nephrectomy 2/2 RCC: Likely in the setting of CHF exacerbation and urinary retention. Cr 2.1, baseline below one.Foley catheter placed yesterday, urine output ~5.6L in the last 24 hours. Goal is to transition to oral diuretics in the next 24-48 hours.  - Nephrology on board, appreciate recommendations. - IV furosemide 60 mg BID  - Daily BMP - Avoid nephrotoxic agents  Acute on Chronic Hypercarbic Respiratory failure: with a history of COPD and PE. ABG this morning showed pH 7.33, pCO2 74. He did not use BiPAP for most of the night. Currently on 5L supplemental oxygen. - Supplemental oxygen 5L  - SpO2 goal 88-92% - Continue BiPAP prn   Acute encephalopathy: Improved, alert and oriented today. Most likely 2/2 respiratory failure. Will continue to treat hypercarbia with BiPAP as tolerated. No bilateral UE myoclonus and asterixis on exam today. Electrolytes wnl.  - Daily renal function panel and Mg   Hypertension: 165/64. He was hypotensive on admission but pressures have improved. Plan is to avoid hypotensive episodes.  Bradycardia: HR 50-60s. Will continue to monitor.   Paroxysmal Atrial Fibrillation: Currently in sinus rhythm. Holding xarelto for heparin  - Heparin per pharmacy  - Hold dronedarone and metoprolol - Started on amiodarone 200 mg daily per cardiology   Dispo: Anticipated discharge pending clinical improvement.   Offie Pickron N, DO 07/18/2018, 7:40 AM Pager: [phone]

## 2018-07-18 NOTE — Progress Notes (Addendum)
  Date: 07/18/2018  Patient name: Babyboy Loya  Medical record number: [id number]  Date of birth: [birth year]   I have seen and evaluated this patient and I have discussed the plan of care with the house staff. Please see their note for complete details. I concur with their findings with the following additions/corrections:  Much improved today, alert and interactive.  Muscle twitching and asterixis has improved, mental status is much clearer.  Brisk diuretic response yesterday, perhaps he had some ATN with auto diuresis as he improves.  He still has severe volume overload on exam with JVP to the ear and bilateral lower extremity edema.  Hypoxia is improving, weaned to 5 L nasal cannula this morning.  ABG not showing up in the system but per RT was 7.33/70, so his hypercarbia persists but he is compensating better now that his kidneys are improving.  Creatinine is down to 2.1.  We will continue diuresis and try to keep him on BiPAP for a portion of the day and especially at night to keep working on his CO2 retention.  Continue heparin drip for now for anticoagulation as he still may need catheterization in the near future for further evaluation.  Appreciate heart failure team assistance, Dr. Haroldine Laws has restarted him on a low-dose of amiodarone as he would not tolerate atrial fibrillation in his current state, and we will continue to hold dronedarone and metoprolol.  Lenice Pressman, M.D., Ph.D. 07/18/2018, 1:39 PM

## 2018-07-19 DIAGNOSIS — J9621 Acute and chronic respiratory failure with hypoxia: Secondary | ICD-10-CM

## 2018-07-19 DIAGNOSIS — J9622 Acute and chronic respiratory failure with hypercapnia: Secondary | ICD-10-CM

## 2018-07-19 HISTORY — DX: Acute and chronic respiratory failure with hypercapnia: J96.22

## 2018-07-19 HISTORY — DX: Acute and chronic respiratory failure with hypoxia: J96.21

## 2018-07-19 LAB — CBC
HCT: 32.8 % — ABNORMAL LOW (ref 39.0–52.0)
Hemoglobin: 10.1 g/dL — ABNORMAL LOW (ref 13.0–17.0)
MCH: 31.7 pg (ref 26.0–34.0)
MCHC: 30.8 g/dL (ref 30.0–36.0)
MCV: 102.8 fL — ABNORMAL HIGH (ref 80.0–100.0)
Platelets: 139 10*3/uL — ABNORMAL LOW (ref 150–400)
RBC: 3.19 MIL/uL — ABNORMAL LOW (ref 4.22–5.81)
RDW: 14.8 % (ref 11.5–15.5)
WBC: 7.3 10*3/uL (ref 4.0–10.5)
nRBC: 0 % (ref 0.0–0.2)

## 2018-07-19 LAB — RENAL FUNCTION PANEL
Albumin: 3.4 g/dL — ABNORMAL LOW (ref 3.5–5.0)
Anion gap: 11 (ref 5–15)
BUN: 31 mg/dL — ABNORMAL HIGH (ref 8–23)
CO2: 38 mmol/L — ABNORMAL HIGH (ref 22–32)
Calcium: 8.5 mg/dL — ABNORMAL LOW (ref 8.9–10.3)
Chloride: 93 mmol/L — ABNORMAL LOW (ref 98–111)
Creatinine, Ser: 1.8 mg/dL — ABNORMAL HIGH (ref 0.61–1.24)
GFR calc Af Amer: 43 mL/min — ABNORMAL LOW (ref >60)
GFR calc non Af Amer: 37 mL/min — ABNORMAL LOW (ref >60)
Glucose, Bld: 90 mg/dL (ref 70–99)
Phosphorus: 3.5 mg/dL (ref 2.5–4.6)
Potassium: 4 mmol/L (ref 3.5–5.1)
Sodium: 142 mmol/L (ref 135–145)

## 2018-07-19 LAB — APTT: aPTT: 54 seconds — ABNORMAL HIGH (ref 24–36)

## 2018-07-19 LAB — VITAMIN B12: Vitamin B-12: 323 pg/mL (ref 180–914)

## 2018-07-19 LAB — BLOOD GAS, ARTERIAL
Acid-Base Excess: 14.7 mmol/L — ABNORMAL HIGH (ref 0.0–2.0)
Bicarbonate: 40.9 mmol/L — ABNORMAL HIGH (ref 20.0–28.0)
Drawn by: 347191
O2 Content: 5 L/min
O2 Saturation: 94.7 %
Patient temperature: 98.3
pCO2 arterial: 75.4 mmHg (ref 32.0–48.0)
pH, Arterial: 7.353 (ref 7.350–7.450)
pO2, Arterial: 75.5 mmHg — ABNORMAL LOW (ref 83.0–108.0)

## 2018-07-19 LAB — HEPARIN LEVEL (UNFRACTIONATED): Heparin Unfractionated: 0.58 IU/mL (ref 0.30–0.70)

## 2018-07-19 NOTE — Discharge Summary (Signed)
Name: Ralph Powell MRN: [id number] DOB: [birth year] 73 y.o. PCP: Ralph Roger, MD  Date of Admission: 07/15/2018 12:27 PM Date of Discharge: 07/25/18 Attending Physician: Lucious Groves, DO  Discharge Diagnosis: 1. Acute Diastolic CHF exacerbation 2. AKI 3. Acute on Chronic Hypercarbic Respiratory Failure 4. Hypertension 5. Bradycardia 6. Paroxysmal Atrial Fibrillation  Discharge Medications: Allergies as of 07/22/2018      Reactions   Oxycodone Itching   Codeine Itching      Medication List    STOP taking these medications   dronedarone 400 MG tablet Commonly known as:  MULTAQ   furosemide 40 MG tablet Commonly known as:  LASIX   metoprolol tartrate 25 MG tablet Commonly known as:  LOPRESSOR     TAKE these medications   acetaminophen 325 MG tablet Commonly known as:  TYLENOL Take 650 mg by mouth daily as needed for mild pain.   allopurinol 100 MG tablet Commonly known as:  ZYLOPRIM Take 100 mg by mouth daily.   amiodarone 200 MG tablet Commonly known as:  PACERONE Take 1 tablet (200 mg total) by mouth daily.   bethanechol 50 MG tablet Commonly known as:  URECHOLINE Take 25 mg by mouth 2 (two) times daily.   gabapentin 100 MG capsule Commonly known as:  NEURONTIN Take 100 mg by mouth 3 (three) times daily.   Iron 325 (65 Fe) MG Tabs Take 1 tablet by mouth daily.   rivaroxaban 20 MG Tabs tablet Commonly known as:  XARELTO Take 20 mg by mouth.   sennosides-docusate sodium 8.6-50 MG tablet Commonly known as:  SENOKOT-S Take 1 tablet by mouth daily.   spironolactone 25 MG tablet Commonly known as:  ALDACTONE Take 0.5 tablets (12.5 mg total) by mouth daily.   tamsulosin 0.4 MG Caps capsule Commonly known as:  FLOMAX Take 0.4 mg by mouth daily.   TART CHERRY ADVANCED PO Take 500 mg by mouth 3 (three) times daily.   torsemide 20 MG tablet Commonly known as:  DEMADEX Take 2 tablets (40 mg total) by mouth daily. What changed:    medication  strength  how much to take  when to take this       Disposition and follow-up:   [name] was discharged from [facility] in Stable condition.  At the hospital follow up visit please address:  1.  AoCHF exacerbation: On torsemide 40 mg daily and spironolactone 12.5 mg daily. Wt down to 113 kg on discharge. Please make sure that he has cardiology follow up.   AoC hypercarbic respiratory failure: Due to OSA, OHS, and COPD. No PFTs noted in chart. Please make sure that he follows up with pulmonology and referral for pulm rehab.   AKI: Cr up to 3 on admission, Cr improved to 1.5 on discharge  Paroxysmal A fib: On amiodarone and xeralto, please monitor and make sure he has cardiology follow up.   2.  Labs / imaging needed at time of follow-up: CBC, BMP  3.  Pending labs/ test needing follow-up: None  Follow-up Appointments: Follow-up Information    Nona Dell, Corene Cornea, MD. Schedule an appointment as soon as possible for a visit in 1 week(s).   Specialty:  Internal Medicine Contact information: [address] [place] [address] [phone]        RevankarReita Cliche, MD .   Specialty:  Cardiology Contact information: [address] [place]  [place] [address] [phone]  [facility] Follow up.   Specialty:  Pulmonology Why:  They should contact you to set up an appointment, if you don't hear from them in a week please contact them to set up appointment Contact information: [address] [place] [address] [place] by problem list:  [name] is a 73 y/o male with multiple medical problems including obesity, chronic respiratory failure due to COPD/untreated OSA/OHS, right nephrectomy 2/2 renal cell carcinoma and PAF s/p recent DC-CV and now on Multaq/Xarelto. He was admitted with severe RV failure complicated by AKI and hypercarbic respiratory failure.   1. Acute Diastolic CHF exacerbation-echocardiogram showed an EF of greater than 65% with right ventricular volume and pressure overload.  No evidence of wall motion abnormalities.  Right atrium severely dilated.  Primarily right ventricular failure in the setting of OHS/OSA/COPD.  Cardiology was consulted and recommended an right heart cath once he was fully diuresed.  He was diuresed with IV furosemide 60 mg twice daily.  Dry weight to 262 pounds per clinic visits. Diuresed well, wt down to 113kg. RHC was repeated that showed mild PAH with essentially normal left sided pressures and high cardiac output. Cardiology adjusted medications from Lasix to torsemide 40 mg daily and spironolactone 12.5 mg daily.  Patient was discharged, had follow-up with cardiology.  2. AKI- patient has a history of previous right nephrectomy due to [place].  He presented with a creatinine of 3.1.  Likely due to cardiorenal syndrome.  Per PCP his creatinine is normal.  Renal ultrasound normal.  After aggressive diuresis he had no urinary output so a Foley catheter was placed.  At that point he had a good urinary response to aggressive IV diuretics and was continued on IV furosemide 60 mg twice daily.  Creatinine improved down to 1.5 on day of discharge.  3. Acute on Chronic Hypercarbic Respiratory Failure-patient is on home supplemental oxygen at 3 L nasal cannula.  ABG showed acute on chronic hypercarbic respiratory failure and he was treated with BiPAP.  His mental status and myoclonus and asterixis all improved.  Will need outpatient neurology follow-up and pulmonary rehab.  Rehab referral was placed on discharge.  4. Hypertension- patient was hypotensive when he arrived, he was on lasix 40 mg daily, metoprolol 12.5 mg BID, multaq 400 mg BID  and torsemide 10 mg BID at home. BP improved while admitted, adjustments to medications as noted above. Avoided beta-blockers per nephro due to history and propensity for hypercapnia.   5.  Bradycardia- Hrs were low, down to 40s-60s, avoided beta-blockers.   6. Paroxysmal Atrial Fibrillation-recently diagnosed in January 2020 status post recent DC-CVA and he was started on Multaq and Xarelto.  He was initially on amiodarone but due to side effects was started on Multaq. Shortly after starting this medication his wife noticed weight gain along with altered mental status and tremors. Discontinued multaq in the setting of HF and bradycardia. Started on amiodarone 200 mg qd and continued xarelto home xeralto.  Discharge Vitals:   BP (!) 148/84 (BP Location: Left Arm)   Pulse 63   Temp 98.6 F (37 C) (Oral)   Resp 19   Ht _0  (1.905 m)   Wt 113.9 kg   SpO2 96%   BMI 31.39 kg/m   Pertinent Labs, Studies, and Procedures:  CBC Latest Ref Rng & Units 07/21/2018 07/21/2018 07/21/2018  WBC 4.0 - 10.5 K/uL - - -  Hemoglobin 13.0 - 17.0 g/dL 11.6(L) 11.9(L) 11.9(L)  Hematocrit 39.0 - 52.0 % 34.0(L) 35.0(L) 35.0(L)  Platelets 150 - 400 K/uL - - -   BMP Latest Ref Rng & Units 07/22/2018 07/21/2018 07/21/2018  Glucose 70 - 99 mg/dL 92 - -  BUN 8 - 23 mg/dL 22 - -  Creatinine 0.61 - 1.24 mg/dL 1.51(H) - -  BUN/Creat Ratio 10 - 24 - - -  Sodium 135 - 145 mmol/L 141 143 142  Potassium 3.5 - 5.1 mmol/L 3.6 3.6 3.8  Chloride 98 - 111 mmol/L 93(L) - -  CO2 22 - 32 mmol/L 39(H) - -  Calcium 8.9 - 10.3 mg/dL 8.8(L) - -   Cardiac craterization 07/21/18: Findings:  RA =  13 RV = 52/17 PA = 50/19 (33) PCW = 17 Fick cardiac output/index = 13.3/5.48  PVR =1.2 WU  Ao sat = 91% PA sat = 74%, 73%  Assessment:  1. Mild PAH with essentially normal left-sided pressures after diuresis 2. High cardiac output (likely overestimated due to Fick)  Echocardiogram 07/16/18: IMPRESSIONS   1. The left ventricle has hyperdynamic systolic function, with an ejection fraction of >65%. The cavity size was mildly dilated. Left ventricular diastolic Doppler parameters are indeterminate. There is  right ventricular volume and pressure overload. No  evidence of left ventricular regional wall motion abnormalities.  2. The right ventricle has normal systolic function. The cavity was moderately enlarged. There is no increase in right ventricular wall thickness. Right ventricular systolic pressure is moderately elevated with an estimated pressure of 66.8 mmHg.  3. Left atrial size was mildly dilated.  4. Right atrial size was severely dilated.  5. There is mild mitral annular calcification present.  6. Tricuspid valve regurgitation is mild-moderate.  7. The aortic valve is tricuspid. Mild sclerosis of the aortic valve.  8. There is mild dilatation of the ascending aorta measuring 39 mm.  9. The inferior vena cava was dilated in size with <50% respiratory variability.   Discharge Instructions: Discharge Instructions    (HEART FAILURE PATIENTS) Call MD:  Anytime you have any of the following symptoms: 1) 3 pound weight gain in 24 hours or 5 pounds in 1 week 2) shortness of breath, with or without a dry hacking cough 3) swelling in the hands, feet or stomach 4) if you have to sleep on extra pillows at night in order to breathe.   Complete by:  As directed    AMB referral to pulmonary rehabilitation   Complete by:  As directed    Please select a program:  Respiratory Care Services   Respiratory Care Services Diagnosis:   Heart Failure Dyspnea     Program Prescription:  O2 Administration by RT, EP, or RN if SpO2<88%   Ambulatory referral to Pulmonology   Complete by:  As directed    COPD, OSA, OHS, caused right sided heart failure   Call MD for:  difficulty breathing, headache or visual disturbances   Complete by:  As directed    Call MD for:  extreme fatigue   Complete by:  As directed    Call MD for:  hives   Complete by:  As directed    Call MD for:  persistant dizziness or light-headedness   Complete by:  As directed    Call MD for:  persistant nausea and vomiting   Complete by:   As directed    Call MD for:  redness, tenderness, or signs of infection (pain, swelling, redness, odor  or green/yellow discharge around incision site)   Complete by:  As directed    Call MD for:  severe uncontrolled pain   Complete by:  As directed    Call MD for:  temperature >100.4   Complete by:  As directed    Diet - low sodium heart healthy   Complete by:  As directed    Discharge instructions   Complete by:  As directed    [name],   It has been a pleasure working with you and we are glad you're feeling better. You were hospitalized for a heart failure exacerbation that was likely due to your pulmonary issues. We have diuresed you and you now have a new dry weight. You had a procedure to check your heart and it looks like the fluid has been removed and is doing better. We have changed some of your medications.   Please START taking: -Torsemide 40 mg (2 tablets) daily -Amiodarone 200 mg daily -Spironolactone 12.5 mg (1/2 tablet) daily  Please STOP taking: -Dronedarone (multaq)  -Furosemide (Lasix) -Metoprolol (Lopressor)  Please follow up with cardiology next week, they should call you to set up appointment Follow up with pulmonology in 1-2 weeks Follow up with your primary care provider in 1 weeks  If your symptoms worsen or you develop new symptoms, please seek medical help whether it is your primary care provider or emergency department.  If you have any questions about this hospitalization please call [phone].   Face-to-face encounter (required for Medicare/Medicaid patients)   Complete by:  As directed    I Asencion Noble certify that this patient is under my care and that I, or a nurse practitioner or physician's assistant working with me, had a face-to-face encounter that meets the physician face-to-face encounter requirements with this patient on 07/22/2018. The encounter with the patient was in whole, or in part for the following medical condition(s) which  is the primary reason for home health care (List medical condition): Heart failure, COPD, OSA, OHS   The encounter with the patient was in whole, or in part, for the following medical condition, which is the primary reason for home health care:  Diastolic heart failure, COPD, OSA   I certify that, based on my findings, the following services are medically necessary home health services:   Physical therapy Nursing     Reason for Medically Necessary Home Health Services:  Skilled Nursing- Changes in Medication/Medication Management   My clinical findings support the need for the above services:  Shortness of breath with activity   Further, I certify that my clinical findings support that this patient is homebound due to:  Shortness of Breath with activity   Home Health   Complete by:  As directed    To provide the following care/treatments:   RN PT OT     Increase activity slowly   Complete by:  As directed       Signed: Asencion Noble, MD 07/22/2018, 10:53 AM

## 2018-07-19 NOTE — Plan of Care (Signed)
  Problem: Clinical Measurements: Goal: Ability to maintain clinical measurements within normal limits will improve Outcome: Progressing Goal: Will remain free from infection Outcome: Progressing Goal: Diagnostic test results will improve Outcome: Progressing Goal: Cardiovascular complication will be avoided Outcome: Progressing   Problem: Education: Goal: Knowledge of General Education information will improve Description Including pain rating scale, medication(s)/side effects and non-pharmacologic comfort measures Outcome: Not Progressing   Problem: Health Behavior/Discharge Planning: Goal: Ability to manage health-related needs will improve Outcome: Not Progressing   Problem: Clinical Measurements: Goal: Respiratory complications will improve Outcome: Not Progressing   Problem: Activity: Goal: Risk for activity intolerance will decrease Outcome: Not Progressing

## 2018-07-19 NOTE — Progress Notes (Signed)
Internal Medicine Attending:   I saw and examined the patient. I reviewed Dr Olevia Perches note and I agree with the resident's findings and plan as documented in the resident's note. Continues to diurese well, anticipate changing to oral diuretics in next 24 hours.  We discussed ongoing importance of use of bipap for his Acute on chronic hypercarbic and hypoxic respiratory failure.

## 2018-07-19 NOTE — Progress Notes (Signed)
   Subjective: Patient reports feeling well this morning. Feels that his breathing is doing okay and his leg swelling has improved. He states he felt warm this morning. He states he does not find the BiPAP mask to be comfortable and he has tried numerous masks for his sleep apnea, unable to find one that he likes. Encouraged BiPAP use. All questions and concerns addressed.   Objective:  Vital signs in last 24 hours: Vitals:   07/18/18 2346 07/19/18 0052 07/19/18 0410 07/19/18 0600  BP:   140/64   Pulse: 61  (!) 57 (!) 53  Resp: _0 Temp:   98.3 F (36.8 C)   TempSrc:   Oral   SpO2:   95% 91%  Weight:  126.2 kg    Height:       Physical Exam Gen: seen laying in bed comfortably, no distress, on 4L supplemental oxygen Ext: lower extremity edema significantly improved, 1+ pitting edema bilaterally  HENT: no JVD appreciated   Assessment/Plan:  Principal Problem:   Heart failure (HCC) Active Problems:   AKI (acute kidney injury) ([place])   Acute encephalopathy   Acute on chronic diastolic (congestive) heart failure (HCC)   Acute cor pulmonale (HCC)   Acute renal failure [facility])  Ralph Powell is a 73 y.o male with a history of paroxysmal atrial fibrillation, DVT, PE, COPD on 3L supplemental oxygen, HTN, obseity and history of renal cell carcinoma with a right nephrectomy presenting to the ED due to decreased urinary output and AKI.Found to have elevated BNP, new RBBB and significant weight gain.  Acute Diastolic CHF exacerbation:He continues to have a good urinary response to IV diuretics. Weight still 278 lbs today but ~5L urine output in the past 24 hours. Plan is to continue IV diuretics. Dry weight is closer to 262lbs per clinic visits.  - Cardiology on board, appreciate recommendations - IV furosemide60 mg BID - Cardiac monitoring  - Daily weights  - Strict ins &outs   AKI /Hx of right nephrectomy 2/2 RCC: Likely in the setting of CHF exacerbation and urinary  retention.Cr 1.8, baseline below one.Foley catheter in place, urine output ~5.1L in the last 24 hours.Goal is to transition to oral diuretics in the next 24 hours if he continues to have good urinary output.  -Nephrology signed off, appreciate recommendations. - IV furosemide 60 mg BID - Daily BMP - Avoid nephrotoxic agents  Acute on Chronic Hypercarbic Respiratory failure: with a history of COPD and PE. ABG this morning showed pH 7.35, pCO2 75, pO2 75. He is intermittently using BiPAP. Will encourage BiPAP use as tolerated. Currently on 4L supplemental oxygen.  - SpO2 goal 88-92% - Continue BiPAP as tolerated   Acute encephalopathy:Improved.Most likely 2/2 respiratory failure. Will continue to treat hypercarbia with BiPAP as tolerated.  - Daily renal function panel and Mg   Hypertension: 165/64 - Per nephro avoid beta-blockers given history and propensity for hypercapnia   Bradycardia: HR 50-60s. Will continue to monitor.   Paroxysmal Atrial Fibrillation: Currently in sinus rhythm.Holding xarelto for heparin -Heparin per pharmacy - Hold dronedarone and metoprolol - C/w amiodarone 200 mg daily   Dispo: Anticipated discharge pending clinical improvement.   [name], DO 07/19/2018, 6:45 AM Pager: [phone]

## 2018-07-19 NOTE — Progress Notes (Addendum)
Spoke with patient wife Ralph Powell concerned about husbands condition. Requesting to have provider call with update on patient condition.  Paged physician _0  Awaiting call back. Paged returned _1  advise provider wife is requesting an update on husbands conditions. Provider advised would call patients wife to give update. Advise phone number on facesheet.

## 2018-07-19 NOTE — Progress Notes (Signed)
Paged by RN that [name] morning ABG showed a pCO2 of 75. Per RN, he also seemed more confused and was satting in the low to mid 80s at an increased level of oxygen supplementation of 6L. He was placed on Bipap by RT at this time. He has been on and off bipap overnight, but doesn't tolerate it for long per nursing. I evaluated the patient at bedside and he appeared comfortable on Bipap. He was alert and oriented to person and time, but not to place. He states that he doesn't like wearing the Bipap because he can't wear his glasses with the mask and has difficulty seeing. He reports that his breathing feels somewhat labored, but it is starting to improve since resuming Bipap. His O2 saturations are in the high 90s. His goal is 88-92%. His ABG is consistent with prior blood gases from this admission and he appears to be chronically hypercarbic.  Plan - Continue Bipap as tolerated

## 2018-07-19 NOTE — Progress Notes (Signed)
CRITICAL VALUE ALERT  Critical Value:  CO2 75.4  Date & Time Notied:  07/19/2018 6:02am  Provider Notified: MD on-call  Orders Received/Actions taken: put patient on BIPAP

## 2018-07-19 NOTE — Progress Notes (Signed)
Patient ID: Ralph Powell, male   DOB: 18-Aug-1945, 73 y.o.   MRN: [id number] [facility] Progress Note   Assessment/ Plan:   1. Acute kidney Injury (on chronic kidney disease stage II/history of right nephrectomy): This appears to be hemodynamically mediated in the setting of CHF exacerbation and exacerbated by urinary retention.  Continues to maintain excellent diuresis in response to intravenous furosemide-may be able to switch this to oral furosemide in the next 24 hours (80 mg twice daily or 40 mg twice daily-the latter would be double his outpatient/admission dose).  Recommend continued follow-up with his primary care provider and referral to nephrology if GFR persistently <60 mL/minute. 2.  Acute exacerbation of congestive heart failure (diastolic): Continues to maintain net negative fluid balance with concomitant weight loss and symptomatic improvement.  May be able to transition to oral diuretics. 3.  Acute encephalopathy: Improving with BiPAP/hypercarbia management as well as diuresis. 4.  Hypertension: Blood pressure intermittently elevated with ongoing diuresis.  Avoid beta-blockers given history/propensity for hypercapnia.  Will sign off at this time.  Call with questions.  Subjective:   Hypercarbia noted this morning with some challenges of BiPAP.   Objective:   BP 140/64 (BP Location: Left Arm)   Pulse (!) 53   Temp 98.3 F (36.8 C) (Oral)   Resp 18   Ht _0  (1.905 m)   Wt 126.2 kg   SpO2 91%   BMI 34.78 kg/m   Intake/Output Summary (Last 24 hours) at 07/19/2018 0808 Last data filed at 07/19/2018 0400 Gross per 24 hour  Intake 660 ml  Output 4800 ml  Net -4140 ml   Weight change: -0.1 kg  Physical Exam: Gen: Awake, alert and resting comfortably in bed CVS: Pulse regular rhythm, normal rate, S1 and S2 normal Resp: Distant breath sounds bilaterally without distinct rales Abd: Soft, obese, nontender Ext: 2+ lower extremity edema over thighs with wrapped  legs  Imaging: No results found.  Labs: BMET Recent Labs  Lab 07/15/18 1420 07/16/18 0523 07/17/18 0442 07/18/18 0216 07/19/18 0224  NA 140 139 140 144 142  K 4.8 4.9 4.5 4.2 4.0  CL 100 99 98 96* 93*  CO2 31 29 32 35* 38*  GLUCOSE 105* 94 81 82 90  BUN 39* 42* 44* 40* 31*  CREATININE 2.97* 3.02* 2.83* 2.13* 1.80*  CALCIUM 8.4* 8.2* 8.1* 8.6* 8.5*  PHOS  --  5.8* 4.8* 3.7 3.5   CBC Recent Labs  Lab 07/15/18 1420 07/17/18 0442 07/18/18 0216 07/19/18 0224  WBC 8.2 6.7 7.4 7.3  NEUTROABS 5.7  --   --   --   HGB 10.6* 9.8* 10.1* 10.1*  HCT 36.8* 33.5* 32.7* 32.8*  MCV 106.7* 103.7* 101.9* 102.8*  PLT 160 146* 155 139*    Medications:    . allopurinol  100 mg Oral Daily  . amiodarone  200 mg Oral Daily  . furosemide  60 mg Intravenous BID  . sodium chloride flush  3 mL Intravenous Q12H  . tamsulosin  0.4 mg Oral Daily  . thiamine  100 mg Oral Daily   Elmarie Shiley, MD 07/19/2018, 8:08 AM

## 2018-07-19 NOTE — Progress Notes (Signed)
ANTICOAGULATION CONSULT NOTE - Follow Up Consult  Pharmacy Consult for Heparin Indication: atrial fibrillation  Allergies  Allergen Reactions  . Oxycodone Itching  . Codeine Itching    Patient Measurements: Height: _0  (190.5 cm) Weight: 278 lb 3.5 oz (126.2 kg) IBW/kg (Calculated) : 84.5 Heparin Dosing Weight:   Vital Signs: Temp: 98.5 F (36.9 C) (04/19 1155) Temp Source: Oral (04/19 1155) BP: 160/74 (04/19 1200) Pulse Rate: 63 (04/19 1200)  Labs: Recent Labs    07/17/18 0442 07/17/18 1433 07/18/18 0216 07/19/18 0224  HGB 9.8*  --  10.1* 10.1*  HCT 33.5*  --  32.7* 32.8*  PLT 146*  --  155 139*  APTT 58* 74* 81* 54*  HEPARINUNFRC 1.64*  --  1.00* 0.58  CREATININE 2.83*  --  2.13* 1.80*    Estimated Creatinine Clearance: 53.9 mL/min (A) (by C-G formula based on SCr of 1.8 mg/dL (H)).  Assessment:  Anticoag: Xarelto PTA (f/u dose on med rec) for h/o afib and DVT/PE.  Xarelto LF 4/15 PM>>heparin 4/16 pm. HL 0.58 in goal, aPTT 54. Hgb 10.1 stable. Plts 139 low but stable.  Goal of Therapy:  Heparin level 0.3-0.7 units/ml Monitor platelets by anticoagulation protocol: Yes   Plan:  IV heparin 1700 units/hr D/c aPTTs, con't daily HL and CBC  Emrys Mckamie S. Alford Highland, PharmD, BCPS Clinical Staff Pharmacist Eilene Ghazi Stillinger 07/19/2018,1:39 PM

## 2018-07-19 NOTE — Progress Notes (Signed)
RT obtained ABG on pt with the following results including a critical PCO2 of 75.4. RT will continue to monitor.  Results for Ralph Powell, Ralph Powell (MRN [id number]) as of 07/19/2018 06:09  Ref. Range 07/19/2018 05:54  Sample type Unknown ARTERIAL DRAW  Delivery systems Unknown NASAL CANNULA  O2 Content Latest Units: L/min 5.0  pH, Arterial Latest Ref Range: 7.350 - 7.450  7.353  pCO2 arterial Latest Ref Range: 32.0 - 48.0 mmHg 75.4 (HH)  pO2, Arterial Latest Ref Range: 83.0 - 108.0 mmHg 75.5 (L)  Acid-Base Excess Latest Ref Range: 0.0 - 2.0 mmol/L 14.7 (H)  Bicarbonate Latest Ref Range: 20.0 - 28.0 mmol/L 40.9 (H)  O2 Saturation Latest Units: % 94.7  Patient temperature Unknown 98.3  Collection site Unknown LEFT RADIAL  Allens test (pass/fail) Latest Ref Range: PASS  PASS

## 2018-07-19 NOTE — Progress Notes (Signed)
Advanced Heart Failure Rounding Note  PCP-Cardiologist: Jenean Lindau, MD   Subjective:    Continues to diurese well.  -4.5L but weight recorded as unchanged.   Breathing better. Denies orthopnea or PND   Creatinine continues to improve. 1.8 today  ABG this am 7.35/75/76/95%   Objective:   Weight Range: 126.2 kg Body mass index is 34.78 kg/m.   Vital Signs:   Temp:  [97.7 F (36.5 C)-98.4 F (36.9 C)] 98.3 F (36.8 C) (04/19 0410) Pulse Rate:  [53-68] 53 (04/19 0600) Resp:  [17-24] 18 (04/19 0600) BP: (137-154)/(57-71) 140/64 (04/19 0410) SpO2:  [91 %-95 %] 91 % (04/19 0600) FiO2 (%):  [60 %] 60 % (04/18 2346) Weight:  [126.2 kg] 126.2 kg (04/19 0052) Last BM Date: 07/18/18  Weight change: Filed Weights   07/17/18 0520 07/18/18 0500 07/19/18 0052  Weight: 132.8 kg 126.3 kg 126.2 kg    Intake/Output:   Intake/Output Summary (Last 24 hours) at 07/19/2018 1006 Last data filed at 07/19/2018 0400 Gross per 24 hour  Intake 420 ml  Output 4700 ml  Net -4280 ml      Physical Exam    General:  Sitting up in bed. No resp difficulty HEENT: normal Neck: supple. no JVD. Carotids 2+ bilat; no bruits. No lymphadenopathy or thryomegaly appreciated. Cor: PMI nondisplaced. Regular rate & rhythm. No rubs, gallops or murmurs. Lungs: clear Abdomen: obese soft, nontender, nondistended. No hepatosplenomegaly. No bruits or masses. Good bowel sounds. Extremities: no cyanosis, clubbing, rash, edema +UNNA boots  Neuro: alert & orientedx3, cranial nerves grossly intact. moves all 4 extremities w/o difficulty. Affect pleasant   Telemetry   Sinus Rhythm/Sinus Loletha Grayer 50-60ss  Personally reviewed   EKG   na   Labs    CBC Recent Labs    07/18/18 0216 07/19/18 0224  WBC 7.4 7.3  HGB 10.1* 10.1*  HCT 32.7* 32.8*  MCV 101.9* 102.8*  PLT 155 017*   Basic Metabolic Panel Recent Labs    07/18/18 0216 07/19/18 0224  NA 144 142  K 4.2 4.0  CL 96* 93*  CO2 35*  38*  GLUCOSE 82 90  BUN 40* 31*  CREATININE 2.13* 1.80*  CALCIUM 8.6* 8.5*  MG 1.8  --   PHOS 3.7 3.5   Liver Function Tests Recent Labs    07/18/18 0216 07/19/18 0224  ALBUMIN 3.4* 3.4*   No results for input(s): LIPASE, AMYLASE in the last 72 hours. Cardiac Enzymes No results for input(s): CKTOTAL, CKMB, CKMBINDEX, TROPONINI in the last 72 hours.  BNP: BNP (last 3 results) Recent Labs    07/15/18 1420  BNP 1,038.4*    ProBNP (last 3 results) Recent Labs    07/10/18 0852  PROBNP 1,888*     D-Dimer No results for input(s): DDIMER in the last 72 hours. Hemoglobin A1C No results for input(s): HGBA1C in the last 72 hours. Fasting Lipid Panel No results for input(s): CHOL, HDL, LDLCALC, TRIG, CHOLHDL, LDLDIRECT in the last 72 hours. Thyroid Function Tests No results for input(s): TSH, T4TOTAL, T3FREE, THYROIDAB in the last 72 hours.  Invalid input(s): FREET3  Other results:   Imaging    No results found.   Medications:     Scheduled Medications:  allopurinol  100 mg Oral Daily   amiodarone  200 mg Oral Daily   furosemide  60 mg Intravenous BID   sodium chloride flush  3 mL Intravenous Q12H   tamsulosin  0.4 mg Oral Daily   thiamine  100 mg Oral Daily    Infusions:  heparin 1,700 Units/hr (07/18/18 2126)    PRN Medications: haloperidol lactate    Patient Profile   Complicated 73 y/o male with multiple medical problems including obesity, chronic respiratory failure due to COPD/untreated OSA/OHS, CKD s/p right nephrectomy and PAF s/p recent DC-CV and now on Multaq/Xarelto. Admitted with severe RV vaiilure complicated by AKI and hypercarbic respiratory failure   Assessment/Plan   1.  Acute Diastolic CHF exacerbation with R>>L symptoms: --Echocardiogram , 07/16/2018 with LVEF of greater than 65% with right ventricular volume and pressure overload and no evidence of wall motion abnormalities. The right atrium is severely dilated. -  This is primarily RV failure in setting of OHS/OSA/COPS (WHO group 3) - Diuresing very well. Weight likely inaccurate - Renal function improving - Continue IV lasix 60 mg twice a day for one more day - Will need RHC when fully diuresed  - Continue UNNA boots  2.  Acute kidney injury with underlying CKD stage II with h/o previous nephrectomy due to [place]: - Likely due to cardiorenal/renal congestion - Prior creatinine noted from 2018 with a baseline of 1.0-1.2 - Creatinine peaked 3.1.  - Todays creatinine is improved with diuresis 1.8 - Nephrology following and signe off - Renal ultrasound with no hydronephrosis  - Foley catheter currently in place per nephrology recommendation given suspected cardiorenal syndrome. Can remove.  - Continue diuresis as above  3.  Hx of paroxysmal atrial fibrillation: -Recently diagnosis after hospitalization with PAF -Seen in outpatient setting by cardiology -Anticoagulated with Xarelto, now renally dosed at 15 mg daily.   -Multaq and metoprolol on hold secondary to bradycardia. Would not restart Multaq with HF. - Now on low dose amio 200 daily as he will likely not tolerate recurrent AF well   4.  History of COPD: -On home supplemental oxygenation at 3 L nasal cannula -Per primary team  5.  OSA: -Not currently on CPAP  6. Bradycardia: - stabe see discussion above  7. Acute on chronic Hypercarbic  Respiratory Failure - continue bipap as needed - ph well compensated now. Looks like he is in the 70-70 club. Will need aggressive rx of OSA/OHS - consider having pulmonary see prior to d/c     Length of Stay: 4  Glori Bickers, MD  10:06 AM   Advanced Heart Failure Team Pager [phone] (M-F; [place])  Please contact [facility] for night-coverage after hours (4p -7a ) and weekends on [email or web address]

## 2018-07-20 LAB — CBC
HCT: 33.9 % — ABNORMAL LOW (ref 39.0–52.0)
Hemoglobin: 10.2 g/dL — ABNORMAL LOW (ref 13.0–17.0)
MCH: 30.4 pg (ref 26.0–34.0)
MCHC: 30.1 g/dL (ref 30.0–36.0)
MCV: 101.2 fL — ABNORMAL HIGH (ref 80.0–100.0)
Platelets: 146 10*3/uL — ABNORMAL LOW (ref 150–400)
RBC: 3.35 MIL/uL — ABNORMAL LOW (ref 4.22–5.81)
RDW: 14.8 % (ref 11.5–15.5)
WBC: 7.4 10*3/uL (ref 4.0–10.5)
nRBC: 0 % (ref 0.0–0.2)

## 2018-07-20 LAB — BLOOD GAS, ARTERIAL
Acid-Base Excess: 19.8 mmol/L — ABNORMAL HIGH (ref 0.0–2.0)
Bicarbonate: 46 mmol/L — ABNORMAL HIGH (ref 20.0–28.0)
Drawn by: 347191
O2 Content: 6 L/min
O2 Saturation: 87.3 %
Patient temperature: 98.5
pCO2 arterial: 77.1 mmHg (ref 32.0–48.0)
pH, Arterial: 7.393 (ref 7.350–7.450)
pO2, Arterial: 54.5 mmHg — ABNORMAL LOW (ref 83.0–108.0)

## 2018-07-20 LAB — RENAL FUNCTION PANEL
Albumin: 3.5 g/dL (ref 3.5–5.0)
Anion gap: 7 (ref 5–15)
BUN: 26 mg/dL — ABNORMAL HIGH (ref 8–23)
CO2: 46 mmol/L — ABNORMAL HIGH (ref 22–32)
Calcium: 8.7 mg/dL — ABNORMAL LOW (ref 8.9–10.3)
Chloride: 88 mmol/L — ABNORMAL LOW (ref 98–111)
Creatinine, Ser: 1.5 mg/dL — ABNORMAL HIGH (ref 0.61–1.24)
GFR calc Af Amer: 54 mL/min — ABNORMAL LOW (ref >60)
GFR calc non Af Amer: 46 mL/min — ABNORMAL LOW (ref >60)
Glucose, Bld: 89 mg/dL (ref 70–99)
Phosphorus: 3 mg/dL (ref 2.5–4.6)
Potassium: 3.7 mmol/L (ref 3.5–5.1)
Sodium: 141 mmol/L (ref 135–145)

## 2018-07-20 LAB — HEPARIN LEVEL (UNFRACTIONATED): Heparin Unfractionated: 0.41 IU/mL (ref 0.30–0.70)

## 2018-07-20 LAB — POCT I-STAT 7, (LYTES, BLD GAS, ICA,H+H)
Acid-Base Excess: 11 mmol/L — ABNORMAL HIGH (ref 0.0–2.0)
Bicarbonate: 39.1 mmol/L — ABNORMAL HIGH (ref 20.0–28.0)
Calcium, Ion: 1.19 mmol/L (ref 1.15–1.40)
HCT: 32 % — ABNORMAL LOW (ref 39.0–52.0)
Hemoglobin: 10.9 g/dL — ABNORMAL LOW (ref 13.0–17.0)
O2 Saturation: 91 %
Potassium: 4 mmol/L (ref 3.5–5.1)
Sodium: 143 mmol/L (ref 135–145)
TCO2: 41 mmol/L — ABNORMAL HIGH (ref 22–32)
pCO2 arterial: 74.1 mmHg (ref 32.0–48.0)
pH, Arterial: 7.33 — ABNORMAL LOW (ref 7.350–7.450)
pO2, Arterial: 70 mmHg — ABNORMAL LOW (ref 83.0–108.0)

## 2018-07-20 LAB — FOLATE RBC
Folate, Hemolysate: 277 ng/mL
Folate, RBC: 874 ng/mL (ref >498)
Hematocrit: 31.7 % — ABNORMAL LOW (ref 37.5–51.0)

## 2018-07-20 MED ORDER — POTASSIUM CHLORIDE CRYS ER 20 MEQ PO TBCR
20.0000 meq | EXTENDED_RELEASE_TABLET | Freq: Once | ORAL | Status: AC
  Administered 2018-07-20: 20 meq via ORAL
  Filled 2018-07-20: qty 1

## 2018-07-20 MED ORDER — SODIUM CHLORIDE 0.9 % IV SOLN
INTRAVENOUS | Status: DC
  Administered 2018-07-21: 07:00:00 via INTRAVENOUS

## 2018-07-20 MED ORDER — ASPIRIN 81 MG PO CHEW
81.0000 mg | CHEWABLE_TABLET | Freq: Once | ORAL | Status: AC
  Administered 2018-07-21: 81 mg via ORAL
  Filled 2018-07-20: qty 1

## 2018-07-20 MED ORDER — TORSEMIDE 20 MG PO TABS
40.0000 mg | ORAL_TABLET | Freq: Every day | ORAL | Status: DC
  Administered 2018-07-21 – 2018-07-22 (×2): 40 mg via ORAL
  Filled 2018-07-20 (×2): qty 2

## 2018-07-20 MED ORDER — ACETAZOLAMIDE 250 MG PO TABS
250.0000 mg | ORAL_TABLET | Freq: Two times a day (BID) | ORAL | Status: AC
  Administered 2018-07-20 (×2): 250 mg via ORAL
  Filled 2018-07-20 (×3): qty 1

## 2018-07-20 NOTE — Plan of Care (Signed)

## 2018-07-20 NOTE — Progress Notes (Signed)
Patient declined to use NIV at this time, no distress noted RCP will continue to follow.

## 2018-07-20 NOTE — Progress Notes (Signed)
IV team at bedside to start 2nd IV

## 2018-07-20 NOTE — Plan of Care (Signed)
  Problem: Elimination: ?Goal: Will not experience complications related to bowel motility ?Outcome: Progressing ?  ?Problem: Pain Managment: ?Goal: General experience of comfort will improve ?Outcome: Progressing ?  ?Problem: Safety: ?Goal: Ability to remain free from injury will improve ?Outcome: Progressing ?  ?

## 2018-07-20 NOTE — Progress Notes (Signed)
Advanced Heart Failure Rounding Note  PCP-Cardiologist: Jenean Lindau, MD   Subjective:    Continues to diurese on IV lasix but slowed down. Weight down 34 pounds total   Breathing better. Denies orthopnea or PND.   Creatinine continues to improve. 1.5 today  ABG this am 7.39/77/54/87%   Objective:   Weight Range: 119.2 kg Body mass index is 32.85 kg/m.   Vital Signs:   Temp:  [97.8 F (36.6 C)-98.7 F (37.1 C)] 98.7 F (37.1 C) (04/20 0724) Pulse Rate:  [52-63] 63 (04/20 0724) Resp:  [15-22] 15 (04/20 0724) BP: (140-163)/(61-77) 140/74 (04/20 0724) SpO2:  [95 %-97 %] 96 % (04/20 0724) Weight:  [119.2 kg] 119.2 kg (04/20 0336) Last BM Date: 07/19/18  Weight change: Filed Weights   07/18/18 0500 07/19/18 0052 07/20/18 0336  Weight: 126.3 kg 126.2 kg 119.2 kg    Intake/Output:   Intake/Output Summary (Last 24 hours) at 07/20/2018 0820 Last data filed at 07/20/2018 0724 Gross per 24 hour  Intake 0 ml  Output 2575 ml  Net -2575 ml      Physical Exam    General:  Sitting up in bed. No resp difficulty HEENT: normal Neck: supple. JVP jaw . Carotids 2+ bilat; no bruits. No lymphadenopathy or thryomegaly appreciated. Cor: PMI nondisplaced. Regular rate & rhythm. No rubs, gallops or murmurs. Lungs: clear Abdomen: obese soft, nontender, nondistended. No hepatosplenomegaly. No bruits or masses. Good bowel sounds. Extremities: no cyanosis, clubbing, rash, edema + UNNA boots  Neuro: alert & orientedx3, cranial nerves grossly intact. moves all 4 extremities w/o difficulty. Affect pleasant   Telemetry   Sinus Rhythm/Sinus Loletha Grayer 50-60ss  Personally reviewed   EKG   na   Labs    CBC Recent Labs    07/19/18 0224 07/20/18 0300  WBC 7.3 7.4  HGB 10.1* 10.2*  HCT 32.8* 33.9*  MCV 102.8* 101.2*  PLT 139* 559*   Basic Metabolic Panel Recent Labs    07/18/18 0216 07/19/18 0224 07/20/18 0300  NA 144 142 141  K 4.2 4.0 3.7  CL 96* 93* 88*  CO2  35* 38* 46*  GLUCOSE 82 90 89  BUN 40* 31* 26*  CREATININE 2.13* 1.80* 1.50*  CALCIUM 8.6* 8.5* 8.7*  MG 1.8  --   --   PHOS 3.7 3.5 3.0   Liver Function Tests Recent Labs    07/19/18 0224 07/20/18 0300  ALBUMIN 3.4* 3.5   No results for input(s): LIPASE, AMYLASE in the last 72 hours. Cardiac Enzymes No results for input(s): CKTOTAL, CKMB, CKMBINDEX, TROPONINI in the last 72 hours.  BNP: BNP (last 3 results) Recent Labs    07/15/18 1420  BNP 1,038.4*    ProBNP (last 3 results) Recent Labs    07/10/18 0852  PROBNP 1,888*     D-Dimer No results for input(s): DDIMER in the last 72 hours. Hemoglobin A1C No results for input(s): HGBA1C in the last 72 hours. Fasting Lipid Panel No results for input(s): CHOL, HDL, LDLCALC, TRIG, CHOLHDL, LDLDIRECT in the last 72 hours. Thyroid Function Tests No results for input(s): TSH, T4TOTAL, T3FREE, THYROIDAB in the last 72 hours.  Invalid input(s): FREET3  Other results:   Imaging    No results found.   Medications:     Scheduled Medications:  allopurinol  100 mg Oral Daily   amiodarone  200 mg Oral Daily   furosemide  60 mg Intravenous BID   sodium chloride flush  3 mL Intravenous Q12H  tamsulosin  0.4 mg Oral Daily   thiamine  100 mg Oral Daily    Infusions:  heparin 1,700 Units/hr (07/20/18 0446)    PRN Medications: haloperidol lactate    Patient Profile   Complicated 73 y/o male with multiple medical problems including obesity, chronic respiratory failure due to COPD/untreated OSA/OHS, CKD s/p right nephrectomy and PAF s/p recent DC-CV and now on Multaq/Xarelto. Admitted with severe RV vaiilure complicated by AKI and hypercarbic respiratory failure   Assessment/Plan   1.  Acute Diastolic CHF exacerbation with R>>L symptoms: --Echocardiogram , 07/16/2018 with LVEF of greater than 65% with right ventricular volume and pressure overload and no evidence of wall motion abnormalities. The right  atrium is severely dilated. - This is primarily RV failure in setting of OHS/OSA/COPS (WHO group 3) - Diuresed well. Now down 34 pounds.  - Renal function improving. Now with contraction alkalosis - Will stop IV lasix. Switch to torsemide 40 daily (start tomorrow) and diamox 250 bid.  - Plan RHC tomorrow - Continue UNNA boots  2.  Acute kidney injury with underlying CKD stage II with h/o previous nephrectomy due to [place]: - Likely due to cardiorenal/renal congestion - Prior creatinine noted from 2018 with a baseline of 1.0-1.2 - Creatinine peaked 3.1.  - Todays creatinine is improved with diuresis 1.5 - Nephrology following and signed off - Renal ultrasound with no hydronephrosis  - Foley catheter currently in place per nephrology recommendation given suspected cardiorenal syndrome. Can remove.  - Adjust diuretics as above  3.  Hx of paroxysmal atrial fibrillation: -Recently diagnosis after hospitalization with PAF -Seen in outpatient setting by cardiology -Anticoagulated with Xarelto, now renally dosed at 15 mg daily.   -Multaq and metoprolol on hold secondary to bradycardia. Would not restart Multaq with HF. - Now on low dose amio 200 daily as he will likely not tolerate recurrent AF well   4. Acute on chronic Hypercarbic  Respiratory Failure - due to COPD/OHS/OSA - continue bipap as needed - ph well compensated now. Looks like he is in the 70-70 club. Will need aggressive rx of OSA/OHS - consider having pulmonary see prior to d/c  - Will need Pulmonary rehab on d/c  5.  OSA: -Not currently on CPAP  6. Bradycardia: - stabe see discussion above    Length of Stay: 5  Glori Bickers, MD  8:20 AM   Advanced Heart Failure Team Pager [phone] (M-F; 7a - 4p)  Please contact [facility] for night-coverage after hours (4p -7a ) and weekends on [email or web address]

## 2018-07-20 NOTE — Progress Notes (Signed)
Subjective: He reports a diffuse headache this morning. He denies any other complaints including chest pain and SOB. He did not wear his CPAP last night stating that it broke.  He does report that his swelling and shortness of breath have improved.  We discussed and we will continue to diurese him, heart failure has planned for heart cath tomorrow.   Objective:  Vital signs in last 24 hours: Vitals:   07/19/18 1707 07/19/18 2200 07/19/18 2353 07/20/18 0336  BP:   (!) 155/77 (!) 163/76  Pulse:   (!) 58 (!) 56  Resp:   (!) 22   Temp: 98.6 F (37 C) 98.6 F (37 C) 97.8 F (36.6 C) 98.5 F (36.9 C)  TempSrc: Oral Oral Oral Oral  SpO2:   96% 95%  Weight:    119.2 kg  Height:        General: Well appearing male, NAD, resting comfortably in bed Cardiac: RRR, no m/r/g, no JVD Pulmonary: Minimal crackles bilaterally, normal work of breathing, on 6L [place] Abdomen: Soft, non-tender, non-distended, no edema Extremity: Unna boots in place, 1+ BL LE edema, warm extremities   Assessment/Plan:  Principal Problem:   Heart failure (HCC) Active Problems:   AKI (acute kidney injury) (HCC)   Acute encephalopathy   Acute on chronic diastolic (congestive) heart failure (HCC)   Acute cor pulmonale (HCC)   Acute renal failure (HCC)   Acute on chronic respiratory failure with hypoxia and hypercapnia (HCC)  This is a 73 year old male with history of paroxysmal atrial fibrillation, DVT, PE, COPD on 3 L supplemental oxygen at home, hypertension, obesity and history had also carcinoma status post right nephrectomy who came and has decreased urinary output and AKI.  He was found to be have an elevated BNP, new RBBB and significant weight gain.  Acute diastolic heart failure exacerbation: Echocardiogram on 4/16 showed EF of >65%, dilated right atrium, and right ventricular volume and pressure overload.  Thought to be due to a OHS/OSA. Will need to hold [place] in setting of acute heart failure  exacerbation.  He has been having good response to his Lasix IV, he received 60 mg BID IV Lasix yesterday.  Net output of 2 L yesterday, net output of 13.5 L since admission.  His weight is down to 119 kgs from 134 kgs on admission, dry weight is around 118 kg in the clinic.  He appears mildly volume overloaded still however extremities swelling and JVD has improved.  Cardiology is following and plan for right heart cath tomorrow, they recommend holding lasix and switching to torsemide tomorrow. -Cardiology following, appreciate recommendations -Torsemide 40 mg daily starting tomorrow, diamox 250 BID -Discontinue IV furosemide -Continue cardiac monitoring -Daily weights -Strict I's and O's -Daily BMP  Acute on chronic hypercarbic respiratory failure: Acute encephalopathy Acute encephalopathy has improved, this is likely secondary to his hypercarbia on admission. Hypercarbia as likely due to OSA and OHS.  He also has a history of COPD.  ABG this morning showed pH 7.39, CO2 77, O2 54.  Patient was not wearing his BiPAP last night due to issues with the tubing, we encouraged use of BiPAP at night.  Patient is currently on 6 L nasal cannula. -Wean supplemental oxygen as tolerated, SpO2 goal of 88 to 92% -Continue BiPAP at night  AKI History of right nephrectomy secondary to RCC: -AKI has resolved, creatinine of 1.5 today, down from 3 on admission.  Nephrology had been following but has now signed off.  Could be  due to cardiorenal from his heart failure.  Foley catheter is still in place will remove today and monitor urinary output.  Hypertension: Pressures been elevated, avoiding beta-blockers per nephrology given history and propensity for hypercarbia  Bradycardia: -Heart rates has been around 48-61, holding beta blockers.Continuing to monitor now.  Paroxysmal atrial fibrillation: -Patient is currently in sinus rhythm, holding Xarelto for now for right heart cath tomorrow.  Continue heparin  per pharmacy. -Continue amiodarone 200 mg daily -Holding dronedarone and metoprolol  FEN: No fluids, replete lytes prn, heart healthy diet  VTE ppx: Heparin Code Status: FULL   Dispo: Anticipated discharge in approximately 2-3 day(s).   Asencion Noble, MD 07/20/2018, 6:13 AM Pager: [phone]

## 2018-07-20 NOTE — Progress Notes (Signed)
[place] for heparin Indication: atrial fibrillation  Allergies  Allergen Reactions  . Oxycodone Itching  . Codeine Itching    Patient Measurements: Height: _0  (190.5 cm) Weight: 262 lb 12.6 oz (119.2 kg) IBW/kg (Calculated) : 84.5 Heparin Dosing Weight: 114kg  Vital Signs: Temp: 98.7 F (37.1 C) (04/20 0724) Temp Source: Oral (04/20 0724) BP: 140/74 (04/20 0724) Pulse Rate: 63 (04/20 0724)  Labs: Recent Labs    07/17/18 1433  07/18/18 0216 07/19/18 0224 07/20/18 0300  HGB  --    < > 10.1* 10.1* 10.2*  HCT  --   --  32.7* 32.8* 33.9*  PLT  --   --  155 139* 146*  APTT 74*  --  81* 54*  --   HEPARINUNFRC  --   --  1.00* 0.58 0.41  CREATININE  --   --  2.13* 1.80* 1.50*   < > = values in this interval not displayed.    Estimated Creatinine Clearance: 62.9 mL/min (A) (by C-G formula based on SCr of 1.5 mg/dL (H)).   Medical History: Past Medical History:  Diagnosis Date  . Arthritis   . COPD (chronic obstructive pulmonary disease) ([place])   . DVT, lower extremity ([place])   . Gout   . History of pulmonary embolus (PE)   . Hyperlipidemia   . Hypertension   . Obesity   . Obstructive sleep apnea   . Renal carcinoma [facility])    Assessment: 73 year old male with PAF currently on xarelto (given last night ~2100). Presented with HF exacerbation and AKI. New orders to hold xarelto and start IV heparin in case invasive procedures are needed.   Heparin level remains therapeutic at 0.41, on 1700 units/hr. Hgb 10.2, plt 146. No s/sx of bleeding. No infusion issues per nursing.    Goal of Therapy:  Heparin level 0.3-0.7 units/ml Monitor platelets by anticoagulation protocol: Yes   Plan:  Continue heparin at 1700 units/hr  Monitor daily HL, CBC, and for s/sx of bleeding  F/u plan for cardiac cath   Antonietta Jewel, PharmD, [place] Clinical Pharmacist  Pager: [phone] Phone: [phone] Please check AMION for all Pharmacist numbers by  unit 07/20/2018 7:59 AM

## 2018-07-20 NOTE — Progress Notes (Signed)
Physical Therapy Treatment Patient Details Name: Ralph Powell MRN: [id number] DOB: 1945/08/07 Today's Date: 07/20/2018    History of Present Illness Ralph Powell is a 73 y.o male with a history of paroxysmal atrial fibrillation, DVT, PE, COPD on 3L supplemental oxygen, HTN, obseity and history of renal cell carcinoma with a right nephrectomy presenting to the ED due to confusion and decreased urinary output.  Work up includes CHF and AKI    PT Comments    Pt required max verbal encouragement to participate in PT session today. Pt ambulated short hallway distance with multiple standing rest breaks to recover fatigue and dyspnea. Pt was on 3LO2 during ambulation this session, maintaining sats 86-92%. Pt placed on 4LO2 post-ambulation. PT discussed the possibility of HHPT vs rehab with pt this session, and pt states he has enough assist at home and has no interest in going to rehab post-hospital stay. PT to continue to follow, and will progress mobility as tolerated.    Follow Up Recommendations  Home health PT;Supervision/Assistance - 24 hour     Equipment Recommendations  Other (comment);None recommended by PT    Recommendations for Other Services       Precautions / Restrictions Precautions Precautions: Fall Restrictions Weight Bearing Restrictions: No    Mobility  Bed Mobility Overal bed mobility: Needs Assistance Bed Mobility: Supine to Sit;Sit to Supine     Supine to sit: Mod assist Sit to supine: Min assist;HOB elevated   General bed mobility comments: Mod assist for supine to sit for trunk elevation, LE management off EOB. Pt encouraged to assist with UEs with use of bedrails. Min assist for sit to supine for LE management, assisting in scooting pt up in bed with use of bed pads.  Transfers Overall transfer level: Needs assistance Equipment used: Rolling walker (2 wheeled) Transfers: Sit to/from Stand Sit to Stand: Min guard;Min assist         General  transfer comment: Min assist for initial power up, steadying. Pt took a few steps and then sat in recliner at EOB. From there, pt min guard for safety, pt with proper placement of UEs when rising.   Ambulation/Gait Ambulation/Gait assistance: Min assist Gait Distance (Feet): 40 Feet Assistive device: Rolling walker (2 wheeled) Gait Pattern/deviations: Step-through pattern;Decreased stride length;Trunk flexed Gait velocity: decr    General Gait Details: Min assist for steadying, frequent verbal cuing for placement in RW and upright posture. Pt sats 86-92% on 3LO2 during ambulation. HR 50s-70s bpm during ambulation, required multiple short standing rest breaks to recover. Pt with dyspnea 2/4 during ambulation.    Stairs             Wheelchair Mobility    Modified Rankin (Stroke Patients Only)       Balance Overall balance assessment: Needs assistance Sitting-balance support: Bilateral upper extremity supported;Feet supported Sitting balance-Leahy Scale: Fair Sitting balance - Comments: able to sit EOB without PT support     Standing balance-Leahy Scale: Poor Standing balance comment: reliant on RW for stability                             Cognition Arousal/Alertness: Awake/alert Behavior During Therapy: WFL for tasks assessed/performed Overall Cognitive Status: Within Functional Limits for tasks assessed                                 General Comments: pt  appeared irritated to have PT today, states he does not feel like doing anything. PT able to encourage pt to perform mobility tasks.       Exercises      General Comments        Pertinent Vitals/Pain Pain Assessment: No/denies pain    Home Living                      Prior Function            PT Goals (current goals can now be found in the care plan section) Acute Rehab PT Goals Patient Stated Goal: Go home to wife PT Goal Formulation: With patient Time For Goal  Achievement: 07/30/18 Potential to Achieve Goals: Good Progress towards PT goals: Progressing toward goals    Frequency    Min 3X/week      PT Plan Current plan remains appropriate    Co-evaluation              AM-PAC PT "6 Clicks" Mobility   Outcome Measure  Help needed turning from your back to your side while in a flat bed without using bedrails?: A Little Help needed moving from lying on your back to sitting on the side of a flat bed without using bedrails?: A Little Help needed moving to and from a bed to a chair (including a wheelchair)?: A Lot Help needed standing up from a chair using your arms (e.g., wheelchair or bedside chair)?: A Little Help needed to walk in hospital room?: A Little Help needed climbing 3-5 steps with a railing? : A Lot 6 Click Score: 16    End of Session Equipment Utilized During Treatment: Gait belt;Oxygen Activity Tolerance: Patient limited by fatigue Patient left: in bed;with call bell/phone within reach;with bed alarm set(unable to motivate pt to sit in recliner post-ambulation) Nurse Communication: Mobility status PT Visit Diagnosis: Unsteadiness on feet (R26.81);Other abnormalities of gait and mobility (R26.89);Difficulty in walking, not elsewhere classified (R26.2)     Time: 2633-3545 PT Time Calculation (min) (ACUTE ONLY): 22 min  Charges:  $Gait Training: 8-22 mins                     Julien Girt, PT Acute Rehabilitation Services Pager [phone]  Office [phone]    Chistina Roston D Tyannah Sane 07/20/2018, 1:40 PM

## 2018-07-20 NOTE — Care Management Important Message (Signed)
Important Message  Patient Details  Name: Ralph Powell MRN: [id number] Date of Birth: [birth year]   Medicare Important Message Given:  Yes    [name] 07/20/2018, 2:51 PM

## 2018-07-21 ENCOUNTER — Encounter (HOSPITAL_COMMUNITY)
Admission: EM | Disposition: A | Discharge status: Home Health | Payer: Self-pay | Source: Home / Self Care | Attending: Internal Medicine

## 2018-07-21 HISTORY — PX: RIGHT HEART CATH: CATH118263

## 2018-07-21 LAB — CBC
HCT: 34.9 % — ABNORMAL LOW (ref 39.0–52.0)
Hemoglobin: 10.5 g/dL — ABNORMAL LOW (ref 13.0–17.0)
MCH: 30.4 pg (ref 26.0–34.0)
MCHC: 30.1 g/dL (ref 30.0–36.0)
MCV: 101.2 fL — ABNORMAL HIGH (ref 80.0–100.0)
Platelets: 148 10*3/uL — ABNORMAL LOW (ref 150–400)
RBC: 3.45 MIL/uL — ABNORMAL LOW (ref 4.22–5.81)
RDW: 14.6 % (ref 11.5–15.5)
WBC: 6.8 10*3/uL (ref 4.0–10.5)
nRBC: 0 % (ref 0.0–0.2)

## 2018-07-21 LAB — HEPARIN LEVEL (UNFRACTIONATED): Heparin Unfractionated: 0.32 IU/mL (ref 0.30–0.70)

## 2018-07-21 LAB — POCT I-STAT EG7
Acid-Base Excess: 12 mmol/L — ABNORMAL HIGH (ref 0.0–2.0)
Acid-Base Excess: 13 mmol/L — ABNORMAL HIGH (ref 0.0–2.0)
Acid-Base Excess: 15 mmol/L — ABNORMAL HIGH (ref 0.0–2.0)
Bicarbonate: 41.4 mmol/L — ABNORMAL HIGH (ref 20.0–28.0)
Bicarbonate: 42.2 mmol/L — ABNORMAL HIGH (ref 20.0–28.0)
Bicarbonate: 44.8 mmol/L — ABNORMAL HIGH (ref 20.0–28.0)
Calcium, Ion: 1.1 mmol/L — ABNORMAL LOW (ref 1.15–1.40)
Calcium, Ion: 1.18 mmol/L (ref 1.15–1.40)
Calcium, Ion: 1.21 mmol/L (ref 1.15–1.40)
HCT: 34 % — ABNORMAL LOW (ref 39.0–52.0)
HCT: 35 % — ABNORMAL LOW (ref 39.0–52.0)
HCT: 35 % — ABNORMAL LOW (ref 39.0–52.0)
Hemoglobin: 11.6 g/dL — ABNORMAL LOW (ref 13.0–17.0)
Hemoglobin: 11.9 g/dL — ABNORMAL LOW (ref 13.0–17.0)
Hemoglobin: 11.9 g/dL — ABNORMAL LOW (ref 13.0–17.0)
O2 Saturation: 73 %
O2 Saturation: 74 %
O2 Saturation: 91 %
Potassium: 3.6 mmol/L (ref 3.5–5.1)
Potassium: 3.7 mmol/L (ref 3.5–5.1)
Potassium: 3.8 mmol/L (ref 3.5–5.1)
Sodium: 142 mmol/L (ref 135–145)
Sodium: 143 mmol/L (ref 135–145)
Sodium: 143 mmol/L (ref 135–145)
TCO2: 44 mmol/L — ABNORMAL HIGH (ref 22–32)
TCO2: 45 mmol/L — ABNORMAL HIGH (ref 22–32)
TCO2: 47 mmol/L — ABNORMAL HIGH (ref 22–32)
pCO2, Ven: 79.2 mmHg (ref 44.0–60.0)
pCO2, Ven: 81.6 mmHg (ref 44.0–60.0)
pCO2, Ven: 84.7 mmHg (ref 44.0–60.0)
pH, Ven: 7.322 (ref 7.250–7.430)
pH, Ven: 7.327 (ref 7.250–7.430)
pH, Ven: 7.331 (ref 7.250–7.430)
pO2, Ven: 44 mmHg (ref 32.0–45.0)
pO2, Ven: 45 mmHg (ref 32.0–45.0)
pO2, Ven: 69 mmHg — ABNORMAL HIGH (ref 32.0–45.0)

## 2018-07-21 LAB — RENAL FUNCTION PANEL
Albumin: 3.6 g/dL (ref 3.5–5.0)
Anion gap: 11 (ref 5–15)
BUN: 21 mg/dL (ref 8–23)
CO2: 41 mmol/L — ABNORMAL HIGH (ref 22–32)
Calcium: 8.8 mg/dL — ABNORMAL LOW (ref 8.9–10.3)
Chloride: 90 mmol/L — ABNORMAL LOW (ref 98–111)
Creatinine, Ser: 1.54 mg/dL — ABNORMAL HIGH (ref 0.61–1.24)
GFR calc Af Amer: 52 mL/min — ABNORMAL LOW (ref >60)
GFR calc non Af Amer: 45 mL/min — ABNORMAL LOW (ref >60)
Glucose, Bld: 92 mg/dL (ref 70–99)
Phosphorus: 3.2 mg/dL (ref 2.5–4.6)
Potassium: 3.6 mmol/L (ref 3.5–5.1)
Sodium: 142 mmol/L (ref 135–145)

## 2018-07-21 SURGERY — RIGHT HEART CATH
Anesthesia: LOCAL

## 2018-07-21 MED ORDER — LIDOCAINE HCL (PF) 1 % IJ SOLN
INTRAMUSCULAR | Status: AC
  Filled 2018-07-21: qty 30

## 2018-07-21 MED ORDER — FENTANYL CITRATE (PF) 100 MCG/2ML IJ SOLN
INTRAMUSCULAR | Status: DC | PRN
  Administered 2018-07-21: 25 ug via INTRAVENOUS

## 2018-07-21 MED ORDER — RIVAROXABAN 20 MG PO TABS
20.0000 mg | ORAL_TABLET | Freq: Every day | ORAL | Status: DC
  Administered 2018-07-21: 20 mg via ORAL
  Filled 2018-07-21: qty 1

## 2018-07-21 MED ORDER — LIDOCAINE HCL (PF) 1 % IJ SOLN
INTRAMUSCULAR | Status: DC | PRN
  Administered 2018-07-21 (×2): 5 mL via INTRADERMAL

## 2018-07-21 MED ORDER — FENTANYL CITRATE (PF) 100 MCG/2ML IJ SOLN
INTRAMUSCULAR | Status: AC
  Filled 2018-07-21: qty 2

## 2018-07-21 MED ORDER — HEPARIN (PORCINE) IN NACL 1000-0.9 UT/500ML-% IV SOLN
INTRAVENOUS | Status: DC | PRN
  Administered 2018-07-21 (×2): 500 mL

## 2018-07-21 MED ORDER — HEPARIN (PORCINE) IN NACL 1000-0.9 UT/500ML-% IV SOLN
INTRAVENOUS | Status: AC
  Filled 2018-07-21: qty 500

## 2018-07-21 SURGICAL SUPPLY — 8 items
CATH BALLN WEDGE 5F 110CM (CATHETERS) ×1 IMPLANT
COVER DOME SNAP 22 D (MISCELLANEOUS) ×1 IMPLANT
KIT MICROPUNCTURE NIT STIFF (SHEATH) ×1 IMPLANT
PACK CARDIAC CATHETERIZATION (CUSTOM PROCEDURE TRAY) ×2 IMPLANT
SHEATH GLIDE SLENDER 4/5FR (SHEATH) ×2 IMPLANT
SHEATH PROBE COVER 6X72 (BAG) ×1 IMPLANT
TRANSDUCER W/STOPCOCK (MISCELLANEOUS) ×2 IMPLANT
TUBING CIL FLEX 10 FLL-RA (TUBING) ×2 IMPLANT

## 2018-07-21 NOTE — Progress Notes (Signed)
RHC results  Findings:  RA =  13 RV = 52/17 PA = 50/19 (33) PCW = 17 Fick cardiac output/index = 13.3/5.48  PVR =1.2 WU  Ao sat = 91% PA sat = 74%, 73%  Assessment:  1. Mild PAH with essentially normal left-sided pressures after diuresis 2. High cardiac output (likely overestimated due to Fick)  Plan/Discussion:  He is about as optimized as we can get him. Suspect combination of WHO Group II & III PH.   Switch to po torsemide 40 daily. Likely home in am if has reasonable response to torsemide.   Glori Bickers, MD  8:23 AM

## 2018-07-21 NOTE — Progress Notes (Signed)
Noted with no post cardiac order, NP made aware.

## 2018-07-21 NOTE — Progress Notes (Signed)
Physical Therapy Treatment Patient Details Name: Ralph Powell MRN: [id number] DOB: 1945-04-19 Today's Date: 07/21/2018    History of Present Illness [name] is a 73 y.o male with a history of paroxysmal atrial fibrillation, DVT, PE, COPD on 3L supplemental oxygen, HTN, obseity and history of renal cell carcinoma with a right nephrectomy presenting to the ED due to confusion and decreased urinary output.  Work up includes CHF and AKI    PT Comments    Patient ambulting 79' on 4L de sat to 85%. Min A at times for stability, poor safety and mechanics with RW, unsteady gait. Agree with prior PT's assessments that patient would benefit from post acute rehab however upon discussion with patient today, he is adamant he doesn't need and wants to return home.      Follow Up Recommendations  Home health PT;Supervision/Assistance - 24 hour     Equipment Recommendations  Other (comment);None recommended by PT    Recommendations for Other Services       Precautions / Restrictions Precautions Precautions: Fall Restrictions Weight Bearing Restrictions: No    Mobility  Bed Mobility Overal bed mobility: Needs Assistance Bed Mobility: Supine to Sit;Sit to Supine     Supine to sit: Mod assist Sit to supine: Min assist;HOB elevated   General bed mobility comments: Mod assist for supine to sit for trunk elevation, LE management off EOB. Pt encouraged to assist with UEs with use of bedrails. Min assist for sit to supine for LE management, assisting in scooting pt up in bed with use of bed pads.  Transfers Overall transfer level: Needs assistance Equipment used: Rolling walker (2 wheeled) Transfers: Sit to/from Stand Sit to Stand: Min guard;Min assist         General transfer comment: min A from mild elevated surface  Ambulation/Gait Ambulation/Gait assistance: Min guard;Min assist Gait Distance (Feet): 55 Feet Assistive device: Rolling walker (2 wheeled) Gait  Pattern/deviations: Step-through pattern;Decreased stride length;Trunk flexed Gait velocity: decreased   General Gait Details: min A at times for steadying, patient with poor mechanics on RW. unsteady. SpO2 to 85% on 4L.    Stairs             Wheelchair Mobility    Modified Rankin (Stroke Patients Only)       Balance Overall balance assessment: Needs assistance Sitting-balance support: Bilateral upper extremity supported;Feet supported Sitting balance-Leahy Scale: Fair Sitting balance - Comments: able to sit EOB without PT support     Standing balance-Leahy Scale: Poor Standing balance comment: reliant on RW for stability                             Cognition Arousal/Alertness: Awake/alert Behavior During Therapy: WFL for tasks assessed/performed Overall Cognitive Status: Within Functional Limits for tasks assessed                                 General Comments: pt appeared irritated to have PT today, states he does not feel like doing anything. PT able to encourage pt to perform mobility tasks.       Exercises      General Comments        Pertinent Vitals/Pain      Home Living                      Prior Function  PT Goals (current goals can now be found in the care plan section) Acute Rehab PT Goals PT Goal Formulation: With patient Time For Goal Achievement: 07/30/18 Potential to Achieve Goals: Good Progress towards PT goals: Progressing toward goals    Frequency    Min 3X/week      PT Plan Current plan remains appropriate    Co-evaluation              AM-PAC PT "6 Clicks" Mobility   Outcome Measure  Help needed turning from your back to your side while in a flat bed without using bedrails?: A Little Help needed moving from lying on your back to sitting on the side of a flat bed without using bedrails?: A Little Help needed moving to and from a bed to a chair (including a  wheelchair)?: A Lot Help needed standing up from a chair using your arms (e.g., wheelchair or bedside chair)?: A Little Help needed to walk in hospital room?: A Little Help needed climbing 3-5 steps with a railing? : A Lot 6 Click Score: 16    End of Session Equipment Utilized During Treatment: Gait belt;Oxygen Activity Tolerance: Patient limited by fatigue Patient left: in bed;with call bell/phone within reach;with bed alarm set Nurse Communication: Mobility status PT Visit Diagnosis: Unsteadiness on feet (R26.81);Other abnormalities of gait and mobility (R26.89);Difficulty in walking, not elsewhere classified (R26.2)     Time: 1040-1105 PT Time Calculation (min) (ACUTE ONLY): 25 min  Charges:  $Gait Training: 8-22 mins $Therapeutic Activity: 8-22 mins                     Reinaldo Berber, PT, DPT Acute Rehabilitation Services Pager: [phone] Office: [phone]     Reinaldo Berber 07/21/2018, 11:06 AM

## 2018-07-21 NOTE — Interval H&P Note (Signed)
History and Physical Interval Note:  07/21/2018 7:47 AM  Ralph Powell  has presented today for surgery, with the diagnosis of heart failure.  The various methods of treatment have been discussed with the patient and family. After consideration of risks, benefits and other options for treatment, the patient has consented to  Procedure(s): RIGHT HEART CATH (N/A) as a surgical intervention.  The patient's history has been reviewed, patient examined, no change in status, stable for surgery.  I have reviewed the patient's chart and labs.  Questions were answered to the patient's satisfaction.     Nareh Matzke

## 2018-07-21 NOTE — H&P (View-Only) (Signed)
  Advanced Heart Failure Rounding Note  PCP-Cardiologist: Rajan R Revankar, MD   Subjective:    IV lasix stopped yesterday.   Feels ok. Denies SOB, orthopnea or PND. Still seems a bit confused at times.   Creatinine stable at 1.5   Objective:   Weight Range: 116.3 kg Body mass index is 32.05 kg/m.   Vital Signs:   Temp:  [97.9 F (36.6 C)-98.6 F (37 C)] 98.6 F (37 C) (04/21 0349) Pulse Rate:  [52-69] 69 (04/21 0043) Resp:  [15-20] 18 (04/21 0349) BP: (145-173)/(70-81) 145/81 (04/21 0349) SpO2:  [94 %-98 %] 94 % (04/21 0043) Weight:  [116.3 kg] 116.3 kg (04/21 0509) Last BM Date: 07/19/18  Weight change: Filed Weights   07/19/18 0052 07/20/18 0336 07/21/18 0509  Weight: 126.2 kg 119.2 kg 116.3 kg    Intake/Output:   Intake/Output Summary (Last 24 hours) at 07/21/2018 0744 Last data filed at 07/21/2018 0631 Gross per 24 hour  Intake 747.91 ml  Output 2150 ml  Net -1402.09 ml      Physical Exam    General:  Sitting up in bed. No resp difficulty HEENT: normal Neck: supple. JVP hard to see looks to jaw . Carotids 2+ bilat; no bruits. No lymphadenopathy or thryomegaly appreciated. Cor: PMI nondisplaced. Regular brady No rubs, gallops or murmurs. Lungs: clear Abdomen: obese soft, nontender, nondistended. No hepatosplenomegaly. No bruits or masses. Good bowel sounds. Extremities: no cyanosis, clubbing, rash, edema Neuro: alert & orientedx3, cranial nerves grossly intact. moves all 4 extremities w/o difficulty. Affect pleasant   Telemetry   Sinus Rhythm/Sinus Brady 50-60s   Personally reviewed   EKG   na   Labs    CBC Recent Labs    07/20/18 0300 07/21/18 0306  WBC 7.4 6.8  HGB 10.2* 10.5*  HCT 33.9* 34.9*  MCV 101.2* 101.2*  PLT 146* 148*   Basic Metabolic Panel Recent Labs    07/20/18 0300 07/21/18 0306  NA 141 142  K 3.7 3.6  CL 88* 90*  CO2 46* 41*  GLUCOSE 89 92  BUN 26* 21  CREATININE 1.50* 1.54*  CALCIUM 8.7* 8.8*  PHOS  3.0 3.2   Liver Function Tests Recent Labs    07/20/18 0300 07/21/18 0306  ALBUMIN 3.5 3.6   No results for input(s): LIPASE, AMYLASE in the last 72 hours. Cardiac Enzymes No results for input(s): CKTOTAL, CKMB, CKMBINDEX, TROPONINI in the last 72 hours.  BNP: BNP (last 3 results) Recent Labs    07/15/18 1420  BNP 1,038.4*    ProBNP (last 3 results) Recent Labs    07/10/18 0852  PROBNP 1,888*     D-Dimer No results for input(s): DDIMER in the last 72 hours. Hemoglobin A1C No results for input(s): HGBA1C in the last 72 hours. Fasting Lipid Panel No results for input(s): CHOL, HDL, LDLCALC, TRIG, CHOLHDL, LDLDIRECT in the last 72 hours. Thyroid Function Tests No results for input(s): TSH, T4TOTAL, T3FREE, THYROIDAB in the last 72 hours.  Invalid input(s): FREET3  Other results:   Imaging    No results found.   Medications:     Scheduled Medications: . [MAR Hold] allopurinol  100 mg Oral Daily  . [MAR Hold] amiodarone  200 mg Oral Daily  . [MAR Hold] sodium chloride flush  3 mL Intravenous Q12H  . [MAR Hold] tamsulosin  0.4 mg Oral Daily  . [MAR Hold] thiamine  100 mg Oral Daily  . [MAR Hold] torsemide  40 mg Oral Daily      Infusions: . sodium chloride 10 mL/hr at 07/21/18 0631  . heparin 1,700 Units/hr (07/21/18 0100)    PRN Medications: [MAR Hold] haloperidol lactate    Patient Profile   Complicated 73 y/o male with multiple medical problems including obesity, chronic respiratory failure due to COPD/untreated OSA/OHS, CKD s/p right nephrectomy and PAF s/p recent DC-CV and now on Multaq/Xarelto. Admitted with severe RV vaiilure complicated by AKI and hypercarbic respiratory failure   Assessment/Plan   1.  Acute Diastolic CHF exacerbation with R>>L symptoms: --Echocardiogram , 07/16/2018 with LVEF of greater than 65% with right ventricular volume and pressure overload and no evidence of wall motion abnormalities. The right atrium is severely  dilated. - This is primarily RV failure in setting of OHS/OSA/COPS (WHO group 3) - Diuresed well. Now down 34 pounds.  - Switch to po torsemide 40 daily  - Plan RHC today - Continue UNNA boots  2.  Acute kidney injury with underlying CKD stage II with h/o previous nephrectomy due to RCC: - Likely due to cardiorenal/renal congestion - Prior creatinine noted from 2018 with a baseline of 1.0-1.2 - Creatinine peaked 3.1.  - Todays creatinine is stable at 1.5 - Nephrology following and signed off - Renal ultrasound with no hydronephrosis  - Adjust diuretics as above  3.  Hx of paroxysmal atrial fibrillation: -Recently diagnosis after hospitalization with PAF -Seen in outpatient setting by cardiology -Anticoagulated with Xarelto, now renally dosed at 15 mg daily.   -Multaq and metoprolol on hold secondary to bradycardia. Would not restart Multaq with HF. - Now on low dose amio 200 daily as he will likely not tolerate recurrent AF well   4. Acute on chronic Hypercarbic  Respiratory Failure - due to COPD/OHS/OSA - continue bipap as needed - ph well compensated now. Looks like he is in the 70-70 club. Will need aggressive rx of OSA/OHS - consider having pulmonary see prior to d/c  - Will need Pulmonary rehab on d/c - RHC today  5.  OSA: -Not currently on CPAP  6. Bradycardia: - stabe see discussion above    Length of Stay: 6  Shian Goodnow, MD  7:44 AM   Advanced Heart Failure Team Pager [phone] (M-F; 7a - 4p)  Please contact [facility] for night-coverage after hours (4p -7a ) and weekends on [email or web address]   

## 2018-07-21 NOTE — Progress Notes (Signed)
[place] for heparin Indication: atrial fibrillation  Allergies  Allergen Reactions  . Oxycodone Itching  . Codeine Itching    Patient Measurements: Height: 6' 3" (190.5 cm) Weight: 256 lb 6.3 oz (116.3 kg) IBW/kg (Calculated) : 84.5 Heparin Dosing Weight: 114kg  Vital Signs: Temp: 98.6 F (37 C) (04/21 0349) Temp Source: Oral (04/21 0349) BP: 145/81 (04/21 0349) Pulse Rate: 69 (04/21 0043)  Labs: Recent Labs    07/19/18 0224 07/19/18 0757 07/20/18 0300 07/21/18 0306  HGB 10.1*  --  10.2* 10.5*  HCT 32.8* 31.7* 33.9* 34.9*  PLT 139*  --  146* 148*  APTT 54*  --   --   --   HEPARINUNFRC 0.58  --  0.41 0.32  CREATININE 1.80*  --  1.50* 1.54*    Estimated Creatinine Clearance: 60.5 mL/min (A) (by C-G formula based on SCr of 1.54 mg/dL (H)).   Medical History: Past Medical History:  Diagnosis Date  . Arthritis   . COPD (chronic obstructive pulmonary disease) ([place])   . DVT, lower extremity ([place])   . Gout   . History of pulmonary embolus (PE)   . Hyperlipidemia   . Hypertension   . Obesity   . Obstructive sleep apnea   . Renal carcinoma [facility])    Assessment: 73 year old male with PAF currently on xarelto (given last night ~2100). Presented with HF exacerbation and AKI. New orders to hold xarelto and start IV heparin in case invasive procedures are needed.   Heparin level remains therapeutic at 0.32, on 1700 units/hr. Hgb 10.5, plt 148. No s/sx of bleeding. No infusion issues per nursing.    Goal of Therapy:  Heparin level 0.3-0.7 units/ml Monitor platelets by anticoagulation protocol: Yes   Plan:  Change back to Xarelto after cath Monitor CBC, and for s/sx of bleeding   Antonietta Jewel, PharmD, BCCCP Clinical Pharmacist  Pager: [phone] Phone: [phone] Please check AMION for all Pharmacist numbers by unit 07/21/2018 7:20 AM

## 2018-07-21 NOTE — Progress Notes (Signed)
Transferred -in from the cath lab by bed awake and alert.

## 2018-07-21 NOTE — Progress Notes (Signed)
Advanced Heart Failure Rounding Note  PCP-Cardiologist: Jenean Lindau, MD   Subjective:    IV lasix stopped yesterday.   Feels ok. Denies SOB, orthopnea or PND. Still seems a bit confused at times.   Creatinine stable at 1.5   Objective:   Weight Range: 116.3 kg Body mass index is 32.05 kg/m.   Vital Signs:   Temp:  [97.9 F (36.6 C)-98.6 F (37 C)] 98.6 F (37 C) (04/21 0349) Pulse Rate:  [52-69] 69 (04/21 0043) Resp:  [15-20] 18 (04/21 0349) BP: (145-173)/(70-81) 145/81 (04/21 0349) SpO2:  [94 %-98 %] 94 % (04/21 0043) Weight:  [116.3 kg] 116.3 kg (04/21 0509) Last BM Date: 07/19/18  Weight change: Filed Weights   07/19/18 0052 07/20/18 0336 07/21/18 0509  Weight: 126.2 kg 119.2 kg 116.3 kg    Intake/Output:   Intake/Output Summary (Last 24 hours) at 07/21/2018 0744 Last data filed at 07/21/2018 0631 Gross per 24 hour  Intake 747.91 ml  Output 2150 ml  Net -1402.09 ml      Physical Exam    General:  Sitting up in bed. No resp difficulty HEENT: normal Neck: supple. JVP hard to see looks to jaw . Carotids 2+ bilat; no bruits. No lymphadenopathy or thryomegaly appreciated. Cor: PMI nondisplaced. Regular brady No rubs, gallops or murmurs. Lungs: clear Abdomen: obese soft, nontender, nondistended. No hepatosplenomegaly. No bruits or masses. Good bowel sounds. Extremities: no cyanosis, clubbing, rash, edema Neuro: alert & orientedx3, cranial nerves grossly intact. moves all 4 extremities w/o difficulty. Affect pleasant   Telemetry   Sinus Rhythm/Sinus Loletha Grayer 50-60s   Personally reviewed   EKG   na   Labs    CBC Recent Labs    07/20/18 0300 07/21/18 0306  WBC 7.4 6.8  HGB 10.2* 10.5*  HCT 33.9* 34.9*  MCV 101.2* 101.2*  PLT 146* 629*   Basic Metabolic Panel Recent Labs    07/20/18 0300 07/21/18 0306  NA 141 142  K 3.7 3.6  CL 88* 90*  CO2 46* 41*  GLUCOSE 89 92  BUN 26* 21  CREATININE 1.50* 1.54*  CALCIUM 8.7* 8.8*  PHOS  3.0 3.2   Liver Function Tests Recent Labs    07/20/18 0300 07/21/18 0306  ALBUMIN 3.5 3.6   No results for input(s): LIPASE, AMYLASE in the last 72 hours. Cardiac Enzymes No results for input(s): CKTOTAL, CKMB, CKMBINDEX, TROPONINI in the last 72 hours.  BNP: BNP (last 3 results) Recent Labs    07/15/18 1420  BNP 1,038.4*    ProBNP (last 3 results) Recent Labs    07/10/18 0852  PROBNP 1,888*     D-Dimer No results for input(s): DDIMER in the last 72 hours. Hemoglobin A1C No results for input(s): HGBA1C in the last 72 hours. Fasting Lipid Panel No results for input(s): CHOL, HDL, LDLCALC, TRIG, CHOLHDL, LDLDIRECT in the last 72 hours. Thyroid Function Tests No results for input(s): TSH, T4TOTAL, T3FREE, THYROIDAB in the last 72 hours.  Invalid input(s): FREET3  Other results:   Imaging    No results found.   Medications:     Scheduled Medications: . [MAR Hold] allopurinol  100 mg Oral Daily  . [MAR Hold] amiodarone  200 mg Oral Daily  . [MAR Hold] sodium chloride flush  3 mL Intravenous Q12H  . [MAR Hold] tamsulosin  0.4 mg Oral Daily  . [MAR Hold] thiamine  100 mg Oral Daily  . [MAR Hold] torsemide  40 mg Oral Daily  Infusions: . sodium chloride 10 mL/hr at 07/21/18 0631  . heparin 1,700 Units/hr (07/21/18 0100)    PRN Medications: [MAR Hold] haloperidol lactate    Patient Profile   Complicated 73 y/o male with multiple medical problems including obesity, chronic respiratory failure due to COPD/untreated OSA/OHS, CKD s/p right nephrectomy and PAF s/p recent DC-CV and now on Multaq/Xarelto. Admitted with severe RV vaiilure complicated by AKI and hypercarbic respiratory failure   Assessment/Plan   1.  Acute Diastolic CHF exacerbation with R>>L symptoms: --Echocardiogram , 07/16/2018 with LVEF of greater than 65% with right ventricular volume and pressure overload and no evidence of wall motion abnormalities. The right atrium is severely  dilated. - This is primarily RV failure in setting of OHS/OSA/COPS (WHO group 3) - Diuresed well. Now down 34 pounds.  - Switch to po torsemide 40 daily  - Plan RHC today - Continue UNNA boots  2.  Acute kidney injury with underlying CKD stage II with h/o previous nephrectomy due to [place]: - Likely due to cardiorenal/renal congestion - Prior creatinine noted from 2018 with a baseline of 1.0-1.2 - Creatinine peaked 3.1.  - Todays creatinine is stable at 1.5 - Nephrology following and signed off - Renal ultrasound with no hydronephrosis  - Adjust diuretics as above  3.  Hx of paroxysmal atrial fibrillation: -Recently diagnosis after hospitalization with PAF -Seen in outpatient setting by cardiology -Anticoagulated with Xarelto, now renally dosed at 15 mg daily.   -Multaq and metoprolol on hold secondary to bradycardia. Would not restart Multaq with HF. - Now on low dose amio 200 daily as he will likely not tolerate recurrent AF well   4. Acute on chronic Hypercarbic  Respiratory Failure - due to COPD/OHS/OSA - continue bipap as needed - ph well compensated now. Looks like he is in the 70-70 club. Will need aggressive rx of OSA/OHS - consider having pulmonary see prior to d/c  - Will need Pulmonary rehab on d/c - RHC today  5.  OSA: -Not currently on CPAP  6. Bradycardia: - stabe see discussion above    Length of Stay: 6  Glori Bickers, MD  7:44 AM   Advanced Heart Failure Team Pager [phone] (M-F; 7a - 4p)  Please contact [facility] for night-coverage after hours (4p -7a ) and weekends on [email or web address]

## 2018-07-21 NOTE — Progress Notes (Signed)
Subjective: Mr. Fetty was seen after his procedure, he denied any complications or issues. No acute events overnight. He reports that his breathing has been doing better and his swelling had improved. He denied any new complaints today. He reports that he is wanting to go home soon. We discussed that we have will be changing his diuretic to torsemide and that he may be able to go home tomorrow.   Objective:  Vital signs in last 24 hours: Vitals:   07/20/18 2300 07/21/18 0043 07/21/18 0349 07/21/18 0509  BP:  (!) 145/70 (!) 145/81   Pulse: (!) 57 69    Resp:   18   Temp:  97.9 F (36.6 C) 98.6 F (37 C)   TempSrc:  Oral Oral   SpO2: 96% 94%    Weight:    116.3 kg  Height:        General: Tired appearing, NAD, resting comfortably Cardiac: RRR, no m/r/g Pulmonary: Minimal wheezing, normal work of breathing Abdomen: Soft, non-tender, non-distended Extremity:Unna boots in place, minimal LE edema  Assessment/Plan:  Principal Problem:   Heart failure (HCC) Active Problems:   AKI (acute kidney injury) ([place])   Acute encephalopathy   Acute on chronic diastolic (congestive) heart failure (HCC)   Acute cor pulmonale (HCC)   Acute renal failure (HCC)   Acute on chronic respiratory failure with hypoxia and hypercapnia (HCC)  This is a 73 year old male with history of paroxysmal atrial fibrillation, DVT, PE, COPD on 3 L supplemental oxygen at home, hypertension, obesity and history had also carcinoma status post right nephrectomy who came and has decreased urinary output and AKI.  He was found to be have an elevated BNP, new RBBB and significant weight gain.  Acute diastolic heart failure exacerbation: Echocardiogram on 4/16 showed EF of >65%, dilated right atrium, and right ventricular volume and pressure overload.  Thought to be due to a OHS/OSA. Will need to hold [place] in setting of acute heart failure exacerbation.  He has been having good response to his Lasix IV, he received 1 dose  of IV lasix yesterday morning. He has had good output of 1.6L, weight down to 116 from 119 kg yesterday. He had the RHC with no complications, showed mild PAH with essentially normal left sided pressures, and high cardiac output. -Cardiology following, appreciate recommendations -Torsemide 40 mg daily  -Continue cardiac monitoring -Daily weights -Strict I's and O's -Daily BMP -Possibly stable for discharge tomorrow  Acute on chronic hypercarbic respiratory failure: Acute encephalopathy Acute encephalopathy has improved, this is likely secondary to his hypercarbia on admission. Hypercarbia as likely due to OSA and OHS.  He also has a history of COPD.  He is currently on 4 L nasal cannula. -Wean supplemental oxygen as tolerated, SpO2 goal of 88 to 92% -Continue BiPAP at night  AKI History of right nephrectomy secondary to RCC: -AKI has resolved, creatinine of 1.5 today, down from 3 on admission.  Nephrology had been following but has now signed off.  Could be due to cardiorenal from his heart failure.  Patient has condom catheter in place.  Hypertension: Pressures been elevated, avoiding beta-blockers per nephrology given history and propensity for hypercarbia  Bradycardia: -Heart rates has been around 48-61, holding beta blockers.Continuing to monitor now.  Paroxysmal atrial fibrillation: -Patient is currently in sinus rhythm -Continue heparin per pharmacy, transition to xeralto today -Continue amiodarone 200 mg daily -Holding dronedarone and metoprolol  FEN: No fluids, replete lytes prn, heart healthy diet  VTE ppx: Heparin  Code Status: FULL   Dispo: Anticipated discharge in approximately 1-2 day(s).   Asencion Noble, MD 07/21/2018, 6:26 AM Pager: [phone]

## 2018-07-22 ENCOUNTER — Encounter (HOSPITAL_COMMUNITY): Payer: Self-pay | Admitting: Internal Medicine

## 2018-07-22 LAB — RENAL FUNCTION PANEL
Albumin: 3.4 g/dL — ABNORMAL LOW (ref 3.5–5.0)
Anion gap: 9 (ref 5–15)
BUN: 22 mg/dL (ref 8–23)
CO2: 39 mmol/L — ABNORMAL HIGH (ref 22–32)
Calcium: 8.8 mg/dL — ABNORMAL LOW (ref 8.9–10.3)
Chloride: 93 mmol/L — ABNORMAL LOW (ref 98–111)
Creatinine, Ser: 1.51 mg/dL — ABNORMAL HIGH (ref 0.61–1.24)
GFR calc Af Amer: 53 mL/min — ABNORMAL LOW (ref >60)
GFR calc non Af Amer: 46 mL/min — ABNORMAL LOW (ref >60)
Glucose, Bld: 92 mg/dL (ref 70–99)
Phosphorus: 3.6 mg/dL (ref 2.5–4.6)
Potassium: 3.6 mmol/L (ref 3.5–5.1)
Sodium: 141 mmol/L (ref 135–145)

## 2018-07-22 MED ORDER — TORSEMIDE 20 MG PO TABS: 40.0000 mg | ORAL_TABLET | Freq: Every day | ORAL | 0 refills | Status: DC

## 2018-07-22 MED ORDER — SPIRONOLACTONE 25 MG PO TABS: 12.5000 mg | ORAL_TABLET | Freq: Every day | ORAL | 0 refills | Status: DC

## 2018-07-22 MED ORDER — SPIRONOLACTONE 12.5 MG HALF TABLET
12.5000 mg | ORAL_TABLET | Freq: Every day | ORAL | Status: DC
  Administered 2018-07-22: 12.5 mg via ORAL
  Filled 2018-07-22: qty 1

## 2018-07-22 MED ORDER — AMIODARONE HCL 200 MG PO TABS: 200.0000 mg | ORAL_TABLET | Freq: Every day | ORAL | 0 refills | Status: DC

## 2018-07-22 NOTE — Progress Notes (Signed)
Physical Therapy Treatment Patient Details Name: Ralph Powell MRN: [id number] DOB: Oct 28, 1945 Today's Date: 07/22/2018    History of Present Illness [name] is a 73 y.o male with a history of paroxysmal atrial fibrillation, DVT, PE, COPD on 3L supplemental oxygen, HTN, obseity and history of renal cell carcinoma with a right nephrectomy presenting to the ED due to confusion and decreased urinary output.  Work up includes CHF and AKI    PT Comments    Patient progressing today requiring less assistance from therapist for gait, and satting better with activity ( see gait comments below). Pt plans to d/c home to today to [place].     Follow Up Recommendations  Home health PT;Supervision/Assistance - 24 hour     Equipment Recommendations  Other (comment);None recommended by PT    Recommendations for Other Services       Precautions / Restrictions Precautions Precautions: Fall Restrictions Weight Bearing Restrictions: No    Mobility  Bed Mobility Overal bed mobility: Needs Assistance Bed Mobility: Supine to Sit;Sit to Supine     Supine to sit: Mod assist Sit to supine: Min assist;HOB elevated   General bed mobility comments: Mod assist for supine to sit for trunk elevation, LE management off EOB. Pt encouraged to assist with UEs with use of bedrails. Min assist for sit to supine for LE management, assisting in scooting pt up in bed with use of bed pads.  Transfers Overall transfer level: Needs assistance Equipment used: Rolling walker (2 wheeled) Transfers: Sit to/from Stand Sit to Stand: Min guard;Min assist         General transfer comment: min A from mild elevated surface  Ambulation/Gait Ambulation/Gait assistance: Min guard;Min assist Gait Distance (Feet): 120 Feet Assistive device: Rolling walker (2 wheeled) Gait Pattern/deviations: Step-through pattern;Decreased stride length;Trunk flexed Gait velocity: decreased   General Gait Details: supervision,  4L WNL SpO2   Stairs             Wheelchair Mobility    Modified Rankin (Stroke Patients Only)       Balance Overall balance assessment: Needs assistance Sitting-balance support: Bilateral upper extremity supported;Feet supported Sitting balance-Leahy Scale: Fair Sitting balance - Comments: able to sit EOB without PT support     Standing balance-Leahy Scale: Poor Standing balance comment: reliant on RW for stability                             Cognition Arousal/Alertness: Awake/alert Behavior During Therapy: WFL for tasks assessed/performed Overall Cognitive Status: Within Functional Limits for tasks assessed                                 General Comments: pt appeared irritated to have PT today, states he does not feel like doing anything. PT able to encourage pt to perform mobility tasks.       Exercises      General Comments        Pertinent Vitals/Pain      Home Living                      Prior Function            PT Goals (current goals can now be found in the care plan section) Acute Rehab PT Goals PT Goal Formulation: With patient Time For Goal Achievement: 07/30/18 Potential to Achieve Goals: Good  Frequency    Min 3X/week      PT Plan Current plan remains appropriate    Co-evaluation              AM-PAC PT "6 Clicks" Mobility   Outcome Measure  Help needed turning from your back to your side while in a flat bed without using bedrails?: A Little Help needed moving from lying on your back to sitting on the side of a flat bed without using bedrails?: A Little Help needed moving to and from a bed to a chair (including a wheelchair)?: A Lot Help needed standing up from a chair using your arms (e.g., wheelchair or bedside chair)?: A Little Help needed to walk in hospital room?: A Little Help needed climbing 3-5 steps with a railing? : A Lot 6 Click Score: 16    End of Session Equipment  Utilized During Treatment: Gait belt;Oxygen Activity Tolerance: Patient limited by fatigue Patient left: in bed;with call bell/phone within reach;with bed alarm set Nurse Communication: Mobility status PT Visit Diagnosis: Unsteadiness on feet (R26.81);Other abnormalities of gait and mobility (R26.89);Difficulty in walking, not elsewhere classified (R26.2)     Time: 1300-1320 PT Time Calculation (min) (ACUTE ONLY): 20 min  Charges:  $Gait Training: 8-22 mins                    Reinaldo Berber, PT, DPT Acute Rehabilitation Services Pager: [phone] Office: [phone]     Reinaldo Berber 07/22/2018, 1:58 PM

## 2018-07-22 NOTE — Plan of Care (Signed)
  Problem: Education: Goal: Knowledge of General Education information will improve Description Including pain rating scale, medication(s)/side effects and non-pharmacologic comfort measures Outcome: Progressing   Problem: Health Behavior/Discharge Planning: Goal: Ability to manage health-related needs will improve Outcome: Progressing   Problem: Clinical Measurements: Goal: Ability to maintain clinical measurements within normal limits will improve Outcome: Progressing Goal: Will remain free from infection Outcome: Progressing Goal: Diagnostic test results will improve Outcome: Progressing Goal: Respiratory complications will improve Outcome: Progressing Goal: Cardiovascular complication will be avoided Outcome: Progressing

## 2018-07-22 NOTE — Progress Notes (Signed)
Advanced Heart Failure Rounding Note  PCP-Cardiologist: Jenean Lindau, MD   Subjective:    RHC done yesterday.   Switch to torsemide 40 daily. Weight down another 2.5 pounds. Denies SOB, orthopnea or PND. Remains confused an occasionally belligerent.   Creatinine stable at 1.5   Objective:   Weight Range: 113.9 kg Body mass index is 31.39 kg/m.   Vital Signs:   Temp:  [98 F (36.7 C)-98.6 F (37 C)] 98.6 F (37 C) (04/22 0752) Pulse Rate:  [46-63] 63 (04/22 0752) Resp:  [16-20] 19 (04/22 0752) BP: (106-156)/(66-84) 148/84 (04/22 0752) SpO2:  [91 %-99 %] 96 % (04/22 0752) Weight:  [113.9 kg] 113.9 kg (04/22 0300) Last BM Date: 07/19/18  Weight change: Filed Weights   07/20/18 0336 07/21/18 0509 07/22/18 0300  Weight: 119.2 kg 116.3 kg 113.9 kg    Intake/Output:   Intake/Output Summary (Last 24 hours) at 07/22/2018 0853 Last data filed at 07/21/2018 1557 Gross per 24 hour  Intake -  Output 1300 ml  Net -1300 ml      Physical Exam    General:  Sitting up in bed No resp difficulty HEENT: normal Neck: supple. JVP 8-9 . Carotids 2+ bilat; no bruits. No lymphadenopathy or thryomegaly appreciated. Cor: PMI nondisplaced. Regular brady. No rubs, gallops or murmurs. Lungs: clear Abdomen: soft, nontender, nondistended. No hepatosplenomegaly. No bruits or masses. Good bowel sounds. Extremities: no cyanosis, clubbing, rash, tr-1+ edema + unna boots Neuro: alert follows commands. Confused at times.     Telemetry   Sinus Rhythm/Sinus Loletha Grayer 50-60s   Personally reviewed   EKG   na   Labs    CBC Recent Labs    07/20/18 0300 07/21/18 0306  07/21/18 0814 07/21/18 0818  WBC 7.4 6.8  --   --   --   HGB 10.2* 10.5*   < > 11.9* 11.6*  HCT 33.9* 34.9*   < > 35.0* 34.0*  MCV 101.2* 101.2*  --   --   --   PLT 146* 148*  --   --   --    < > = values in this interval not displayed.   Basic Metabolic Panel Recent Labs    07/21/18 0306  07/21/18 0818  07/22/18 0248  NA 142   < > 143 141  K 3.6   < > 3.6 3.6  CL 90*  --   --  93*  CO2 41*  --   --  39*  GLUCOSE 92  --   --  92  BUN 21  --   --  22  CREATININE 1.54*  --   --  1.51*  CALCIUM 8.8*  --   --  8.8*  PHOS 3.2  --   --  3.6   < > = values in this interval not displayed.   Liver Function Tests Recent Labs    07/21/18 0306 07/22/18 0248  ALBUMIN 3.6 3.4*   No results for input(s): LIPASE, AMYLASE in the last 72 hours. Cardiac Enzymes No results for input(s): CKTOTAL, CKMB, CKMBINDEX, TROPONINI in the last 72 hours.  BNP: BNP (last 3 results) Recent Labs    07/15/18 1420  BNP 1,038.4*    ProBNP (last 3 results) Recent Labs    07/10/18 0852  PROBNP 1,888*     D-Dimer No results for input(s): DDIMER in the last 72 hours. Hemoglobin A1C No results for input(s): HGBA1C in the last 72 hours. Fasting Lipid Panel No results for  input(s): CHOL, HDL, LDLCALC, TRIG, CHOLHDL, LDLDIRECT in the last 72 hours. Thyroid Function Tests No results for input(s): TSH, T4TOTAL, T3FREE, THYROIDAB in the last 72 hours.  Invalid input(s): FREET3  Other results:   Imaging    No results found.   Medications:     Scheduled Medications: . allopurinol  100 mg Oral Daily  . amiodarone  200 mg Oral Daily  . rivaroxaban  20 mg Oral Q supper  . sodium chloride flush  3 mL Intravenous Q12H  . tamsulosin  0.4 mg Oral Daily  . thiamine  100 mg Oral Daily  . torsemide  40 mg Oral Daily    Infusions:   PRN Medications: haloperidol lactate    Patient Profile   Complicated 73 y/o male with multiple medical problems including obesity, chronic respiratory failure due to COPD/untreated OSA/OHS, CKD s/p right nephrectomy and PAF s/p recent DC-CV and now on Multaq/Xarelto. Admitted with severe RV vaiilure complicated by AKI and hypercarbic respiratory failure   Assessment/Plan   1.  Acute Diastolic CHF exacerbation with R>>L symptoms: --Echocardiogram , 07/16/2018  with LVEF of greater than 65% with right ventricular volume and pressure overload and no evidence of wall motion abnormalities. The right atrium is severely dilated. - RHC performed yesterday with mild to moderate PH. (better than I expecteed) - Diuresed well. Now down 45 pounds total.  - Continue torsemide 40 daily and add spiro 12.5  - No supplemental K at home yet. Recheck labs in 1 week  2.  Acute kidney injury with underlying CKD stage II with h/o previous nephrectomy due to [place]: - Likely due to cardiorenal/renal congestion - Prior creatinine noted from 2018 with a baseline of 1.0-1.2 - Creatinine peaked 3.1.  - Todays creatinine is stable at 1.5 - Nephrology following and signed off - Renal ultrasound with no hydronephrosis  - Adjust diuretics as above  3.  Hx of paroxysmal atrial fibrillation: -Recently diagnosis after hospitalization with PAF -Seen in outpatient setting by cardiology -Anticoagulated with Xarelto, now renally dosed at 15 mg daily.   -Multaq and metoprolol on hold secondary to bradycardia. Would not restart Multaq with HF. - Now on low dose amio 200 daily as he will likely not tolerate recurrent AF well   4. Acute on chronic Hypercarbic  Respiratory Failure - due to COPD/OHS/OSA - continue bipap as needed - On ABG pH well compensated now. Looks like he is in the 70-70 club. Will need aggressive rx of OSA/OHS - consider having pulmonary see prior to d/c  - Will need Pulmonary rehab on d/c -  5.  OSA: -Not currently on CPAP  6. Bradycardia: - stabe see discussion above  OK for d/c home today from our standpoint (if mental status is sufficient)  Stop home lasix, metoprolol and Multaq.   Use torsemide 40 daily, amio 200 daily and spiro 12.5.   Continue Xarelto at 57m daily  [facility]   We will sign off.   Length of Stay: 7  DGlori Bickers MD  8:53 AM   Advanced Heart Failure Team Pager [phone](M-F; 7a -  4p)  Please contact [facility] for night-coverage after hours (4p -7a ) and weekends on [email or web address]

## 2018-07-22 NOTE — TOC Transition Note (Signed)
Transition of Care [facility]) - CM/SW Discharge Note   Patient Details  Name: Ralph Powell MRN: [id number] Date of Birth: 1945/09/17  [facility] CM/SW Contact:  Maryclare Labrador, RN Phone Number: 07/22/2018, 11:23 AM   Clinical Narrative: Pt to discharge home today in care of wife.  Wife will transport pt via private vehicle.  [facility] contacted and official referral accepted - agency informed of discharge home today.  CM set up appt with PCP Dr Nona Dell - appt will be face to face.  No other CM needs identified - CM signing off    Final next level of care: [place] Barriers to Discharge: Barriers Resolved   Patient Goals and CMS Choice Patient states their goals for this hospitalization and ongoing recovery are:: (Pt remains confused - CM spoke with wife Ralph Powell)   Choice offered to / list presented to : Spouse  Discharge Placement                       Discharge Plan and Services   Discharge Planning Services: CM Consult Post Acute Care Choice: Home Health              HH Arranged: RN, PT, OT [facility] Agency: [place]   Social Determinants of Health (SDOH) Interventions     Readmission Risk Interventions Readmission Risk Prevention Plan 07/22/2018 07/17/2018  Transportation Screening - Complete  PCP or Specialist Appt within 3-5 Days Complete -  HRI or Jet - Complete  Social Work Consult for [place] Planning/Counseling - Complete  Palliative Care Screening - Not Applicable  Medication Review [employer]) - Complete  Some recent data might be hidden

## 2018-07-22 NOTE — Progress Notes (Signed)
Subjective: Mr. Iyengar states that he "is doing fine". He denies SOB, chest pain, abdominal pain. He denies any new complaints. He was not sure what medications he was on at home and had mild confusion. The plan for discharge today was discussed. All questions and concerns were answered.   Spoke with wife who reports that she is the one who gives him his medications and that the patient is unsure what medications he takes. I told her the changes that we are making and that he will need to follow up with cardiology, pulmonology, and his PCP. She was asking about home health services and we discussed that we should be able to arrange that for them. All of her questions were answered  Objective:  Vital signs in last 24 hours: Vitals:   07/21/18 1300 07/21/18 1400 07/21/18 1900 07/22/18 0300  BP: 135/67 131/66 (!) 156/83 106/69  Pulse: (!) 46 (!) 56 (!) 57 61  Resp: _0 Temp:   98.3 F (36.8 C) 98 F (36.7 C)  TempSrc:   Oral Oral  SpO2: 99% 99% 99% 91%  Weight:    113.9 kg  Height:        General: Lying in bed in no acute distress. Cardiac: Bradycardic into the 60s with normal rhythm Pulmonary: CTABL, no wheezing or rhonchi Abdomen: Soft, non-tender, non-distended Extremity: Unna boots in place, trade LE edema  Assessment/Plan:  Principal Problem:   Heart failure (HCC) Active Problems:   AKI (acute kidney injury) ([place])   Acute encephalopathy   Acute on chronic diastolic (congestive) heart failure (HCC)   Acute cor pulmonale (HCC)   Acute renal failure (HCC)   Acute on chronic respiratory failure with hypoxia and hypercapnia (HCC)  This is a 73 year old male with history of paroxysmal atrial fibrillation, DVT, PE, COPD on 3 L supplemental oxygen at home, hypertension, obesity and history had also carcinoma status post right nephrectomy who came and has decreased urinary output and AKI.  He was found to be have an elevated BNP, new RBBB and significant weight gain.   Acute diastolic heart failure exacerbation: Echocardiogram on 4/16 showed EF of >65%, dilated right atrium, and right ventricular volume and pressure overload.  Thought to be due to a OHS/OSA. Will need to hold [place] in setting of acute heart failure exacerbation. Patient was started on torsemide 40 mg daily yesterday. He has had 1.3 L output yesterday, weight is now down to 113 kg from 116 yesterday, total weight down from 134 kg on admission. RHC done yesterday showed mild PAH with essentially normal left sided pressures and high cardiac output. Today he reports that he is feeling fine and is ready to go home.  -Cardiology following, appreciate recommendations -Torsemide 40 mg daily  -Spironolactone 12.5 mg daily -Continue cardiac monitoring -Daily weights -Strict I's and O's -Stable for d/c today, will need follow up with cardiology   Acute on chronic hypercarbic respiratory failure: Acute encephalopathy Acute encephalopathy has improved, this was likely secondary to his hypercarbia on admission. Hypercarbia as likely due to OSA and OHS.  He also has a history of COPD.  He is currently on 4 L nasal cannula. He is on 3L [place] at home.  -Wean supplemental oxygen as tolerated, SpO2 goal of 88 to 92% -Continue BiPAP at night -Patient will need an outpatient pulmonology follow up, referral placed -Pulm rehab referral placed  AKI History of right nephrectomy secondary to [place]: -AKI has resolved, creatinine of 1.5 today, down  from 3 on admission.  Nephrology had been following but has now signed off.  Could be due to cardiorenal from his heart failure. Will need repeat labs on follow up.  Hypertension: Pressures been elevated, avoiding beta-blockers per nephrology given history and propensity for hypercarbia.  Bradycardia: -Heart rates has been around 48-61, holding beta blockers. Will hold BB on discharge.  Paroxysmal atrial fibrillation: -Patient is currently in sinus rhythm  -Continue  xeralto  -Continue amiodarone 200 mg daily -Holding dronedarone and metoprolol on discharge  FEN: No fluids, replete lytes prn, heart healthy diet  VTE ppx: Heparin Code Status: FULL   Dispo: Anticipated discharge in approximately today.   Asencion Noble, MD 07/22/2018, 6:30 AM Pager: [phone]

## 2018-07-24 DIAGNOSIS — I50811 Acute right heart failure: Secondary | ICD-10-CM | POA: Diagnosis not present

## 2018-07-24 DIAGNOSIS — I0981 Rheumatic heart failure: Secondary | ICD-10-CM | POA: Diagnosis not present

## 2018-07-24 DIAGNOSIS — I130 Hypertensive heart and chronic kidney disease with heart failure and stage 1 through stage 4 chronic kidney disease, or unspecified chronic kidney disease: Secondary | ICD-10-CM | POA: Diagnosis not present

## 2018-07-24 DIAGNOSIS — I5031 Acute diastolic (congestive) heart failure: Secondary | ICD-10-CM | POA: Diagnosis not present

## 2018-07-28 ENCOUNTER — Other Ambulatory Visit: Payer: Self-pay

## 2018-07-28 ENCOUNTER — Ambulatory Visit (HOSPITAL_COMMUNITY)
Admit: 2018-07-28 | Discharge: 2018-07-28 | Disposition: A | Discharge status: Home / Self Care | Payer: Medicare Other | Attending: Internal Medicine | Admitting: Internal Medicine

## 2018-07-29 ENCOUNTER — Telehealth: Payer: Self-pay

## 2018-07-29 ENCOUNTER — Telehealth (INDEPENDENT_AMBULATORY_CARE_PROVIDER_SITE_OTHER): Payer: Medicare Other | Admitting: Cardiology

## 2018-07-29 ENCOUNTER — Other Ambulatory Visit: Payer: Self-pay

## 2018-07-29 ENCOUNTER — Telehealth: Payer: Self-pay | Admitting: *Deleted

## 2018-07-29 ENCOUNTER — Encounter: Payer: Self-pay | Admitting: Cardiology

## 2018-07-29 VITALS — BP 141/66 | HR 60 | Ht 75.0 in | Wt 242.0 lb

## 2018-07-29 DIAGNOSIS — I5033 Acute on chronic diastolic (congestive) heart failure: Secondary | ICD-10-CM

## 2018-07-29 DIAGNOSIS — Z86711 Personal history of pulmonary embolism: Secondary | ICD-10-CM

## 2018-07-29 DIAGNOSIS — Z85528 Personal history of other malignant neoplasm of kidney: Secondary | ICD-10-CM

## 2018-07-29 DIAGNOSIS — I480 Paroxysmal atrial fibrillation: Secondary | ICD-10-CM

## 2018-07-29 DIAGNOSIS — Z86718 Personal history of other venous thrombosis and embolism: Secondary | ICD-10-CM

## 2018-07-29 DIAGNOSIS — J431 Panlobular emphysema: Secondary | ICD-10-CM

## 2018-07-29 DIAGNOSIS — C649 Malignant neoplasm of unspecified kidney, except renal pelvis: Secondary | ICD-10-CM

## 2018-07-29 NOTE — Telephone Encounter (Signed)
Caro Laroche, RN at [facility], called about blood work to be done. Let her know Dr. Geraldo Pitter wanted a Chem 7. Said she would get this and fax resutls to [place] office.

## 2018-07-29 NOTE — Patient Instructions (Signed)
Medication Instructions:  Your physician recommends that you continue on your current medications as directed. Please refer to the Current Medication list given to you today.  If you need a refill on your cardiac medications before your next appointment, please call your pharmacy.   Lab work: NONE today, RN will coordinate home health blood draw of BMP to check kidney function.   If you have labs (blood work) drawn today and your tests are completely normal, you will receive your results only by: Marland Kitchen MyChart Message (if you have MyChart) OR . A paper copy in the mail If you have any lab test that is abnormal or we need to change your treatment, we will call you to review the results.  Testing/Procedures: NONE  Follow-Up: At [facility], you and your health needs are our priority.  As part of our continuing mission to provide you with exceptional heart care, we have created designated Provider Care Teams.  These Care Teams include your primary Cardiologist (physician) and Advanced Practice Providers (APPs -  Physician Assistants and Nurse Practitioners) who all work together to provide you with the care you need, when you need it. You will need a follow up appointment in 2 weeks.

## 2018-07-29 NOTE — Addendum Note (Signed)
Addended by: Beckey Rutter on: 07/29/2018 09:37 AM   Modules accepted: Orders

## 2018-07-29 NOTE — Telephone Encounter (Signed)
Left message for nurse to call back with information to order BMP requested by Dr. Docia Furl.

## 2018-07-29 NOTE — Progress Notes (Signed)
Virtual Visit via Telephone Note   This visit type was conducted due to national recommendations for restrictions regarding the COVID-19 Pandemic (e.g. social distancing) in an effort to limit this patient's exposure and mitigate transmission in our community.  Due to his co-morbid illnesses, this patient is at least at moderate risk for complications without adequate follow up.  This format is felt to be most appropriate for this patient at this time.  The patient did not have access to video technology/had technical difficulties with video requiring transitioning to audio format only (telephone).  All issues noted in this document were discussed and addressed.  No physical exam could be performed with this format.  Please refer to the patient's chart for his  consent to telehealth for [facility].   Evaluation Performed:  Follow-up visit  Date:  07/29/2018   ID:  Ralph Powell, Ralph Powell [birth year], MRN [id number]  Patient Location: Home Provider Location: Home  PCP:  Townsend Roger, MD  Cardiologist:  Jenean Lindau, MD  Electrophysiologist:  None   Chief Complaint: Shortness of breath  History of Present Illness:    Ralph Powell is a 73 y.o. male with past medical history of congestive heart failure, advanced COPD, post nephrectomy, paroxysmal atrial fibrillation and history of DVT and pulmonary embolism.  He has history of atrial fibrillation and was initiated on dronedarone.  He could not tolerate that well.  This did not help control his rhythm.  He landed up in the hospital.  We had hesitated from starting him on amiodarone because of lung toxicity.  The patient seems to have tolerated it well for this time and he received intravenous diuretics.  He was initially sent to [facility] and sent home and subsequently he went to [facility].  At the time of my evaluation, the patient is alert awake oriented and in no distress.  He is very happy and his weight has been  steady  The patient does not have symptoms concerning for COVID-19 infection (fever, chills, cough, or new shortness of breath).    Past Medical History:  Diagnosis Date   Arthritis    COPD (chronic obstructive pulmonary disease) ([place])    DVT, lower extremity ([place])    Gout    History of pulmonary embolus (PE)    Hyperlipidemia    Hypertension    Obesity    Obstructive sleep apnea    Renal carcinoma ([place])    Past Surgical History:  Procedure Laterality Date   NEPHRECTOMY Right    RIGHT HEART CATH N/A 07/21/2018   Procedure: RIGHT HEART CATH;  Surgeon: Jolaine Artist, MD;  Location: [place] [facility];  Service: Cardiovascular;  Laterality: N/A;     Current Meds  Medication Sig   acetaminophen (TYLENOL) 325 MG tablet Take 650 mg by mouth daily as needed for mild pain.    allopurinol (ZYLOPRIM) 100 MG tablet Take 100 mg by mouth daily.    amiodarone (PACERONE) 200 MG tablet Take 1 tablet (200 mg total) by mouth daily.   bethanechol (URECHOLINE) 50 MG tablet Take 25 mg by mouth 2 (two) times daily.    Ferrous Sulfate (IRON) 325 (65 Fe) MG TABS Take 1 tablet by mouth daily.   gabapentin (NEURONTIN) 100 MG capsule Take 100 mg by mouth 3 (three) times daily.    Misc Natural Products (TART CHERRY ADVANCED PO) Take 500 mg by mouth 3 (three) times daily.    rivaroxaban (XARELTO) 20 MG TABS tablet Take  20 mg by mouth daily with supper.    sennosides-docusate sodium (SENOKOT-S) 8.6-50 MG tablet Take 1 tablet by mouth daily.   spironolactone (ALDACTONE) 25 MG tablet Take 0.5 tablets (12.5 mg total) by mouth daily.   tamsulosin (FLOMAX) 0.4 MG CAPS capsule Take 0.4 mg by mouth daily.    torsemide (DEMADEX) 20 MG tablet Take 2 tablets (40 mg total) by mouth daily.     Allergies:   Oxycodone and Codeine   Social History   Tobacco Use   Smoking status: Former Smoker   Smokeless tobacco: Never Used  Substance Use Topics   Alcohol use: Not Currently     Frequency: Never   Drug use: Not on file     Family Hx: The patient's family history is not on file.  ROS:   Please see the history of present illness.    No chest pain orthopnea or PND. All other systems reviewed and are negative.   Prior CV studies:   The following studies were reviewed today:  Results of the right heart catheterization were reviewed with the patient today they were mildly elevated pressures.  Please see note below for complete details  Labs/Other Tests and Data Reviewed:    EKG:  An ECG dated 4/15 and 07/16/2018 was personally reviewed today and demonstrated:  Sinus rhythm and bradycardia and mildly prolonged PR interval.  Recent Labs: 07/10/2018: NT-Pro BNP 1,888 07/15/2018: ALT 12; B Natriuretic Peptide 1,038.4 07/18/2018: Magnesium 1.8 07/21/2018: Hemoglobin 11.6; Platelets 148 07/22/2018: BUN 22; Creatinine, Ser 1.51; Potassium 3.6; Sodium 141   Recent Lipid Panel No results found for: CHOL, TRIG, HDL, CHOLHDL, LDLCALC, LDLDIRECT  Wt Readings from Last 3 Encounters:  07/29/18 242 lb (109.8 kg)  07/22/18 251 lb 1.7 oz (113.9 kg)  07/08/18 282 lb (127.9 kg)     Objective:    Vital Signs:  BP (!) 141/66 (BP Location: Left Arm, Patient Position: Sitting, Cuff Size: Normal)    Pulse 60    Ht _0  (1.905 m)    Wt 242 lb (109.8 kg)    SpO2 95%    BMI 30.25 kg/m    VITAL SIGNS:  reviewed  ASSESSMENT & PLAN:    1. Congestive heart failure: It appears mostly of the diastolic type.  The patient's right heart pressures were mildly elevated and patient is tolerating medications well.  He is on 2 diuretics.  I want him to get a Chem-7.  His wife mentions to me that he is in the process of having a visiting nurse come home.  My nurse will get in touch with the patient's wife to coordinate this through the home health visiting nurse.  The importance of diet and regular weight checks was discussed and patient and wife vocalized understanding.  His wife is very  supportive. 2. Essential hypertension: Blood pressure is stable. 3. Paroxysmal atrial fibrillation:I discussed with the patient atrial fibrillation, disease process. Management and therapy including rate and rhythm control, anticoagulation benefits and potential risks were discussed extensively with the patient. Patient had multiple questions which were answered to patient's satisfaction.  This will also help in view of history of thromboembolism in the past 4. Toxicities of medications like amiodarone in patients with pulmonary pathology was explained and they vocalized understanding and questions were answered to their satisfaction.  Patient will be seen in follow-up appointment in 2 weeks or earlier if he has any concerns.  COVID-19 Education: The signs and symptoms of COVID-19 were discussed with the patient  and how to seek care for testing (follow up with PCP or arrange E-visit).  The importance of social distancing was discussed today.  Time:   Today, I have spent 20 minutes with the patient with telehealth technology discussing the above problems.  Total time for review of records and discussing them with the patient at length was 35 minutes   Medication Adjustments/Labs and Tests Ordered: Current medicines are reviewed at length with the patient today.  Concerns regarding medicines are outlined above.   Tests Ordered: No orders of the defined types were placed in this encounter.   Medication Changes: No orders of the defined types were placed in this encounter.   Disposition:  Follow up in 2 week(s)  Signed, Jenean Lindau, MD  07/29/2018 9:11 AM    [place]

## 2018-07-30 ENCOUNTER — Telehealth: Payer: Self-pay

## 2018-07-30 DIAGNOSIS — J431 Panlobular emphysema: Secondary | ICD-10-CM | POA: Diagnosis not present

## 2018-07-30 DIAGNOSIS — I10 Essential (primary) hypertension: Secondary | ICD-10-CM | POA: Diagnosis not present

## 2018-07-30 NOTE — Telephone Encounter (Signed)
Spoke with Amanda-Bayada to concerning labs. She has drawn the requested lab and dropped off at [facility] for testing.

## 2018-08-12 ENCOUNTER — Other Ambulatory Visit: Payer: Self-pay | Admitting: Internal Medicine

## 2018-08-12 ENCOUNTER — Other Ambulatory Visit: Payer: Self-pay

## 2018-08-12 ENCOUNTER — Telehealth (INDEPENDENT_AMBULATORY_CARE_PROVIDER_SITE_OTHER): Payer: Medicare Other | Admitting: Cardiology

## 2018-08-12 ENCOUNTER — Telehealth: Payer: Self-pay | Admitting: *Deleted

## 2018-08-12 ENCOUNTER — Encounter: Payer: Self-pay | Admitting: Cardiology

## 2018-08-12 VITALS — BP 124/72 | HR 56 | Temp 97.0°F | Ht 75.0 in | Wt 239.0 lb

## 2018-08-12 DIAGNOSIS — I480 Paroxysmal atrial fibrillation: Secondary | ICD-10-CM

## 2018-08-12 DIAGNOSIS — N182 Chronic kidney disease, stage 2 (mild): Secondary | ICD-10-CM | POA: Diagnosis not present

## 2018-08-12 DIAGNOSIS — Z86711 Personal history of pulmonary embolism: Secondary | ICD-10-CM

## 2018-08-12 DIAGNOSIS — J449 Chronic obstructive pulmonary disease, unspecified: Secondary | ICD-10-CM | POA: Diagnosis not present

## 2018-08-12 DIAGNOSIS — I300 Acute nonspecific idiopathic pericarditis: Secondary | ICD-10-CM | POA: Diagnosis not present

## 2018-08-12 DIAGNOSIS — Z86718 Personal history of other venous thrombosis and embolism: Secondary | ICD-10-CM

## 2018-08-12 MED ORDER — AMIODARONE HCL 100 MG PO TABS: 100.0000 mg | ORAL_TABLET | Freq: Every day | ORAL | 1 refills | Status: DC

## 2018-08-12 NOTE — Telephone Encounter (Signed)
Spoke with Harley Alto with [facility] [phone]) to go out and draw BMP and Liver function labs on patient. Almyra Free took verbal order and said she will draw today.

## 2018-08-12 NOTE — Progress Notes (Signed)
Virtual Visit via Telephone Note   This visit type was conducted due to national recommendations for restrictions regarding the COVID-19 Pandemic (e.g. social distancing) in an effort to limit this patient's exposure and mitigate transmission in our community.  Due to his co-morbid illnesses, this patient is at least at moderate risk for complications without adequate follow up.  This format is felt to be most appropriate for this patient at this time.  The patient did not have access to video technology/had technical difficulties with video requiring transitioning to audio format only (telephone).  All issues noted in this document were discussed and addressed.  No physical exam could be performed with this format.  Please refer to the patient's chart for his  consent to telehealth for [facility].   Date:  08/12/2018   ID:  Ralph Powell, DOB [birth year], MRN [id number]  Patient Location: Home Provider Location: Office  PCP:  Townsend Roger, MD  Cardiologist:  Jenean Lindau, MD  Electrophysiologist:  None   Evaluation Performed:  Follow-Up Visit  Chief Complaint: Paroxysmal atrial fibrillation  History of Present Illness:    Ralph Powell is a 73 y.o. male with past medical history of essential hypertension and paroxysmal atrial fibrillation and COPD.  He was admitted to the hospital with atrial fibrillation and initiated on amiodarone.  Subsequently is done fine.  He mentions to me that he has lost a pound or 2 in the past 2 weeks.  No chest pain orthopnea or PND.  He is very comfortable.  His wife is very supportive and she accompanies him on the phone.  At the time of my evaluation, the patient is alert awake oriented and in no distress.  The patient does not have symptoms concerning for COVID-19 infection (fever, chills, cough, or new shortness of breath).    Past Medical History:  Diagnosis Date  . Arthritis   . COPD (chronic obstructive pulmonary disease) ([place])   . DVT,  lower extremity ()   . Gout   . History of pulmonary embolus (PE)   . Hyperlipidemia   . Hypertension   . Obesity   . Obstructive sleep apnea   . Renal carcinoma [facility])    Past Surgical History:  Procedure Laterality Date  . NEPHRECTOMY Right   . RIGHT HEART CATH N/A 07/21/2018   Procedure: RIGHT HEART CATH;  Surgeon: Jolaine Artist, MD;  Location: [place] [facility];  Service: Cardiovascular;  Laterality: N/A;     Current Meds  Medication Sig  . acetaminophen (TYLENOL) 325 MG tablet Take 650 mg by mouth daily as needed for mild pain.   Marland Kitchen allopurinol (ZYLOPRIM) 100 MG tablet Take 100 mg by mouth daily.   Marland Kitchen amiodarone (PACERONE) 200 MG tablet Take 1 tablet (200 mg total) by mouth daily.  . bethanechol (URECHOLINE) 50 MG tablet Take 25 mg by mouth 2 (two) times daily.   . Ferrous Sulfate (IRON) 325 (65 Fe) MG TABS Take 1 tablet by mouth daily.  Marland Kitchen gabapentin (NEURONTIN) 100 MG capsule Take 100 mg by mouth 3 (three) times daily.   . Misc Natural Products (TART CHERRY ADVANCED PO) Take 500 mg by mouth 3 (three) times daily.   . rivaroxaban (XARELTO) 20 MG TABS tablet Take 20 mg by mouth daily with supper.   . sennosides-docusate sodium (SENOKOT-S) 8.6-50 MG tablet Take 1 tablet by mouth daily.  Marland Kitchen spironolactone (ALDACTONE) 25 MG tablet Take 0.5 tablets (12.5 mg total) by mouth daily.  . tamsulosin (  FLOMAX) 0.4 MG CAPS capsule Take 0.4 mg by mouth daily.   Marland Kitchen torsemide (DEMADEX) 20 MG tablet Take 2 tablets (40 mg total) by mouth daily.     Allergies:   Oxycodone and Codeine   Social History   Tobacco Use  . Smoking status: Former Research scientist (life sciences)  . Smokeless tobacco: Never Used  Substance Use Topics  . Alcohol use: Not Currently    Frequency: Never  . Drug use: Not on file     Family Hx: The patient's family history is not on file.  ROS:   Please see the history of present illness.    As mentioned above All other systems reviewed and are negative.   Prior CV studies:    The following studies were reviewed today:  None  Labs/Other Tests and Data Reviewed:    EKG:  No ECG reviewed.  Recent Labs: 07/10/2018: NT-Pro BNP 1,888 07/15/2018: ALT 12; B Natriuretic Peptide 1,038.4 07/18/2018: Magnesium 1.8 07/21/2018: Hemoglobin 11.6; Platelets 148 07/22/2018: BUN 22; Creatinine, Ser 1.51; Potassium 3.6; Sodium 141   Recent Lipid Panel No results found for: CHOL, TRIG, HDL, CHOLHDL, LDLCALC, LDLDIRECT  Wt Readings from Last 3 Encounters:  08/12/18 239 lb (108.4 kg)  07/29/18 242 lb (109.8 kg)  07/22/18 251 lb 1.7 oz (113.9 kg)     Objective:    Vital Signs:  BP 124/72 (BP Location: Left Arm, Patient Position: Sitting, Cuff Size: Normal)   Pulse (!) 56   Temp (!) 97 F (36.1 C)   Ht _0  (1.905 m)   Wt 239 lb (108.4 kg)   SpO2 98%   BMI 29.87 kg/m    VITAL SIGNS:  reviewed  ASSESSMENT & PLAN:    1. Atrial fibrillation:I discussed with the patient atrial fibrillation, disease process. Management and therapy including rate and rhythm control, anticoagulation benefits and potential risks were discussed extensively with the patient. Patient had multiple questions which were answered to patient's satisfaction. 2. In view of history of COPD I will try to reduce his dose of amiodarone to 100 mg daily. 3. He has a home health visiting nurse.  I am not sure what happened from the last time but he was supposed to get blood work.  My nurse will get in touch with his wife about coordinating with the home health nurse about getting Chem-7 and liver panel. 4. Follow-up appointment in a month or earlier if he has any concerns.    COVID-19 Education: The signs and symptoms of COVID-19 were discussed with the patient and how to seek care for testing (follow up with PCP or arrange E-visit).  The importance of social distancing was discussed today.  Time:   Today, I have spent 15 minutes with the patient with telehealth technology discussing the above problems.      Medication Adjustments/Labs and Tests Ordered: Current medicines are reviewed at length with the patient today.  Concerns regarding medicines are outlined above.   Tests Ordered: No orders of the defined types were placed in this encounter.   Medication Changes: No orders of the defined types were placed in this encounter.   Disposition:  Follow up in 1 month(s)  Signed, Jenean Lindau, MD  08/12/2018 10:33 AM    [place]

## 2018-08-12 NOTE — Addendum Note (Signed)
Addended by: Particia Nearing B on: 08/12/2018 11:32 AM   Modules accepted: Orders

## 2018-08-12 NOTE — Patient Instructions (Signed)
Medication Instructions:  Your physician has recommended you make the following change in your medication:  DECREASE: Amiodarone to 100 mg (1 TAb) daily  If you need a refill on your cardiac medications before your next appointment, please call your pharmacy.   Lab work: Your physician recommends that you return for lab work in:  [place]  If you have labs (blood work) drawn today and your tests are completely normal, you will receive your results only by: Marland Kitchen MyChart Message (if you have MyChart) OR . A paper copy in the mail If you have any lab test that is abnormal or we need to change your treatment, we will call you to review the results.  Testing/Procedures: None  Follow-Up: At [facility], you and your health needs are our priority.  As part of our continuing mission to provide you with exceptional heart care, we have created designated Provider Care Teams.  These Care Teams include your primary Cardiologist (physician) and Advanced Practice Providers (APPs -  Physician Assistants and Nurse Practitioners) who all work together to provide you with the care you need, when you need it. You will need a follow up appointment in 1 months.   Any Other Special Instructions Will Be Listed Below (If Applicable).

## 2018-08-17 ENCOUNTER — Telehealth: Payer: Medicare Other | Admitting: Cardiology

## 2018-08-18 ENCOUNTER — Other Ambulatory Visit: Payer: Self-pay | Admitting: Internal Medicine

## 2018-08-20 ENCOUNTER — Other Ambulatory Visit: Payer: Self-pay | Admitting: Internal Medicine

## 2018-08-26 DIAGNOSIS — I5032 Chronic diastolic (congestive) heart failure: Secondary | ICD-10-CM | POA: Diagnosis not present

## 2018-09-24 ENCOUNTER — Other Ambulatory Visit: Payer: Self-pay

## 2018-09-24 ENCOUNTER — Encounter: Payer: Self-pay | Admitting: Cardiology

## 2018-09-24 ENCOUNTER — Telehealth (INDEPENDENT_AMBULATORY_CARE_PROVIDER_SITE_OTHER): Payer: Medicare Other | Admitting: Cardiology

## 2018-09-24 VITALS — BP 124/68 | HR 59 | Ht 75.0 in | Wt 248.0 lb

## 2018-09-24 DIAGNOSIS — I480 Paroxysmal atrial fibrillation: Secondary | ICD-10-CM | POA: Diagnosis not present

## 2018-09-24 DIAGNOSIS — G4733 Obstructive sleep apnea (adult) (pediatric): Secondary | ICD-10-CM

## 2018-09-24 DIAGNOSIS — Z86711 Personal history of pulmonary embolism: Secondary | ICD-10-CM

## 2018-09-24 NOTE — Patient Instructions (Signed)
Medication Instructions:  Your physician recommends that you continue on your current medications as directed. Please refer to the Current Medication list given to you today.  If you need a refill on your cardiac medications before your next appointment, please call your pharmacy.   Lab work: NONE If you have labs (blood work) drawn today and your tests are completely normal, you will receive your results only by: Marland Kitchen MyChart Message (if you have MyChart) OR . A paper copy in the mail If you have any lab test that is abnormal or we need to change your treatment, we will call you to review the results.  Testing/Procedures: NONE  Follow-Up: At [facility], you and your health needs are our priority.  As part of our continuing mission to provide you with exceptional heart care, we have created designated Provider Care Teams.  These Care Teams include your primary Cardiologist (physician) and Advanced Practice Providers (APPs -  Physician Assistants and Nurse Practitioners) who all work together to provide you with the care you need, when you need it. You will need a follow up appointment in 4 months.

## 2018-09-24 NOTE — Progress Notes (Signed)
Virtual Visit via Telephone Note   This visit type was conducted due to national recommendations for restrictions regarding the COVID-19 Pandemic (e.g. social distancing) in an effort to limit this patient's exposure and mitigate transmission in our community.  Due to his co-morbid illnesses, this patient is at least at moderate risk for complications without adequate follow up.  This format is felt to be most appropriate for this patient at this time.  The patient did not have access to video technology/had technical difficulties with video requiring transitioning to audio format only (telephone).  All issues noted in this document were discussed and addressed.  No physical exam could be performed with this format.  Please refer to the patient's chart for his  consent to telehealth for [facility].   Date:  09/24/2018   ID:  Ralph Powell, DOB [birth year], MRN [id number]  Patient Location: Home Provider Location: Office  PCP:  Townsend Roger, MD  Cardiologist:  Jenean Lindau, MD  Electrophysiologist:  None   Evaluation Performed:  Follow-Up Visit  Chief Complaint: Paroxysmal atrial fibrillation  History of Present Illness:    Ralph Powell is a 73 y.o. male with with past medical history paroxysmal fibrillation essential hypertension and dyslipidemia.  He denies any problems at this time and takes care of activities of daily living.  No chest pain orthopnea or PND.  At the time of my evaluation, the patient is alert awake oriented and in no distress.  He tells me that he has bad knees and uses a cane to walk.  He stable on his feet.  He is never had a fall.  He had blood work with his primary care physician a few days ago and was told it was fine.  I reviewed only a part of the blood work especially renal function and it appears fine.  The patient does not have symptoms concerning for COVID-19 infection (fever, chills, cough, or new shortness of breath).    Past Medical History:   Diagnosis Date  . Arthritis   . COPD (chronic obstructive pulmonary disease) ([place])   . DVT, lower extremity ([place])   . Gout   . History of pulmonary embolus (PE)   . Hyperlipidemia   . Hypertension   . Obesity   . Obstructive sleep apnea   . Renal carcinoma [facility])    Past Surgical History:  Procedure Laterality Date  . NEPHRECTOMY Right   . RIGHT HEART CATH N/A 07/21/2018   Procedure: RIGHT HEART CATH;  Surgeon: Jolaine Artist, MD;  Location: [place] [facility];  Service: Cardiovascular;  Laterality: N/A;     Current Meds  Medication Sig  . acetaminophen (TYLENOL) 325 MG tablet Take 650 mg by mouth daily as needed for mild pain.   Marland Kitchen allopurinol (ZYLOPRIM) 100 MG tablet Take 100 mg by mouth daily.   Marland Kitchen amiodarone (PACERONE) 100 MG tablet Take 1 tablet (100 mg total) by mouth daily.  . bethanechol (URECHOLINE) 50 MG tablet Take 25 mg by mouth 2 (two) times daily.   . Ferrous Sulfate (IRON) 325 (65 Fe) MG TABS Take 1 tablet by mouth daily.  Marland Kitchen gabapentin (NEURONTIN) 100 MG capsule Take 100 mg by mouth 3 (three) times daily.   . Misc Natural Products (TART CHERRY ADVANCED PO) Take 500 mg by mouth 3 (three) times daily.   . rivaroxaban (XARELTO) 20 MG TABS tablet Take 20 mg by mouth daily with supper.   . sennosides-docusate sodium (SENOKOT-S) 8.6-50 MG tablet Take  1 tablet by mouth daily.  Marland Kitchen spironolactone (ALDACTONE) 25 MG tablet Take 0.5 tablets (12.5 mg total) by mouth daily.  . tamsulosin (FLOMAX) 0.4 MG CAPS capsule Take 0.4 mg by mouth daily.   Marland Kitchen torsemide (DEMADEX) 20 MG tablet Take 2 tablets (40 mg total) by mouth daily.     Allergies:   Oxycodone and Codeine   Social History   Tobacco Use  . Smoking status: Former Research scientist (life sciences)  . Smokeless tobacco: Never Used  Substance Use Topics  . Alcohol use: Not Currently    Frequency: Never  . Drug use: Not on file     Family Hx: The patient's family history is not on file.  ROS:   Please see the history of present illness.     As mentioned above All other systems reviewed and are negative.   Prior CV studies:   The following studies were reviewed today:  None  Labs/Other Tests and Data Reviewed:    EKG:  No ECG reviewed.  Recent Labs: 07/10/2018: NT-Pro BNP 1,888 07/15/2018: ALT 12; B Natriuretic Peptide 1,038.4 07/18/2018: Magnesium 1.8 07/21/2018: Hemoglobin 11.6; Platelets 148 07/22/2018: BUN 22; Creatinine, Ser 1.51; Potassium 3.6; Sodium 141   Recent Lipid Panel No results found for: CHOL, TRIG, HDL, CHOLHDL, LDLCALC, LDLDIRECT  Wt Readings from Last 3 Encounters:  09/24/18 248 lb (112.5 kg)  08/12/18 239 lb (108.4 kg)  07/29/18 242 lb (109.8 kg)     Objective:    Vital Signs:  BP 124/68 (BP Location: Left Arm, Patient Position: Sitting, Cuff Size: Normal)   Pulse (!) 59   Ht _0  (1.905 m)   Wt 248 lb (112.5 kg)   SpO2 97%   BMI 31.00 kg/m    VITAL SIGNS:  reviewed  ASSESSMENT & PLAN:    1. Paroxysmal atrial fibrillation:I discussed with the patient atrial fibrillation, disease process. Management and therapy including rate and rhythm control, anticoagulation benefits and potential risks were discussed extensively with the patient. Patient had multiple questions which were answered to patient's satisfaction. 2. Essential hypertension: Blood pressure stable 3. Mixed dyslipidemia: Diet was discussed.  We will get a copy of blood work from primary care physician. 4. Patient will be seen in follow-up appointment in 4 months or earlier if the patient has any concerns   COVID-19 Education: The signs and symptoms of COVID-19 were discussed with the patient and how to seek care for testing (follow up with PCP or arrange E-visit).  The importance of social distancing was discussed today.  Time:   Today, I have spent 15 minutes with the patient with telehealth technology discussing the above problems.     Medication Adjustments/Labs and Tests Ordered: Current medicines are reviewed at  length with the patient today.  Concerns regarding medicines are outlined above.   Tests Ordered: No orders of the defined types were placed in this encounter.   Medication Changes: No orders of the defined types were placed in this encounter.   Follow Up:  Virtual Visit or In Person in 4 month(s)  Signed, Jenean Lindau, MD  09/24/2018 10:33 AM    [place]

## 2018-09-28 DIAGNOSIS — I5032 Chronic diastolic (congestive) heart failure: Secondary | ICD-10-CM | POA: Diagnosis not present

## 2018-11-26 DIAGNOSIS — Z905 Acquired absence of kidney: Secondary | ICD-10-CM | POA: Diagnosis not present

## 2018-11-26 DIAGNOSIS — N401 Enlarged prostate with lower urinary tract symptoms: Secondary | ICD-10-CM | POA: Diagnosis not present

## 2018-11-26 DIAGNOSIS — N2889 Other specified disorders of kidney and ureter: Secondary | ICD-10-CM | POA: Diagnosis not present

## 2018-11-27 DIAGNOSIS — N281 Cyst of kidney, acquired: Secondary | ICD-10-CM | POA: Diagnosis not present

## 2018-11-27 DIAGNOSIS — N2889 Other specified disorders of kidney and ureter: Secondary | ICD-10-CM | POA: Diagnosis not present

## 2018-11-27 DIAGNOSIS — K8020 Calculus of gallbladder without cholecystitis without obstruction: Secondary | ICD-10-CM | POA: Diagnosis not present

## 2018-12-04 DIAGNOSIS — I10 Essential (primary) hypertension: Secondary | ICD-10-CM | POA: Diagnosis not present

## 2018-12-04 DIAGNOSIS — I429 Cardiomyopathy, unspecified: Secondary | ICD-10-CM | POA: Diagnosis not present

## 2018-12-30 DIAGNOSIS — N2889 Other specified disorders of kidney and ureter: Secondary | ICD-10-CM | POA: Diagnosis not present

## 2018-12-30 DIAGNOSIS — N401 Enlarged prostate with lower urinary tract symptoms: Secondary | ICD-10-CM | POA: Diagnosis not present

## 2019-01-13 DIAGNOSIS — I5032 Chronic diastolic (congestive) heart failure: Secondary | ICD-10-CM | POA: Diagnosis not present

## 2019-01-13 DIAGNOSIS — M109 Gout, unspecified: Secondary | ICD-10-CM | POA: Diagnosis not present

## 2019-01-13 DIAGNOSIS — N183 Chronic kidney disease, stage 3 unspecified: Secondary | ICD-10-CM | POA: Diagnosis not present

## 2019-01-13 DIAGNOSIS — I4891 Unspecified atrial fibrillation: Secondary | ICD-10-CM | POA: Diagnosis not present

## 2019-01-13 DIAGNOSIS — I10 Essential (primary) hypertension: Secondary | ICD-10-CM | POA: Diagnosis not present

## 2019-01-13 DIAGNOSIS — Z6833 Body mass index (BMI) 33.0-33.9, adult: Secondary | ICD-10-CM | POA: Diagnosis not present

## 2019-01-13 DIAGNOSIS — E7800 Pure hypercholesterolemia, unspecified: Secondary | ICD-10-CM | POA: Diagnosis not present

## 2019-01-13 DIAGNOSIS — Z23 Encounter for immunization: Secondary | ICD-10-CM | POA: Diagnosis not present

## 2019-01-13 DIAGNOSIS — Z1389 Encounter for screening for other disorder: Secondary | ICD-10-CM | POA: Diagnosis not present

## 2019-01-13 DIAGNOSIS — N400 Enlarged prostate without lower urinary tract symptoms: Secondary | ICD-10-CM | POA: Diagnosis not present

## 2019-01-13 DIAGNOSIS — J449 Chronic obstructive pulmonary disease, unspecified: Secondary | ICD-10-CM | POA: Diagnosis not present

## 2019-01-13 DIAGNOSIS — H6121 Impacted cerumen, right ear: Secondary | ICD-10-CM | POA: Diagnosis not present

## 2019-01-13 DIAGNOSIS — Z86718 Personal history of other venous thrombosis and embolism: Secondary | ICD-10-CM | POA: Diagnosis not present

## 2019-01-28 ENCOUNTER — Ambulatory Visit: Payer: Medicare Other | Admitting: Cardiology

## 2019-02-03 DIAGNOSIS — Z86010 Personal history of colonic polyps: Secondary | ICD-10-CM | POA: Diagnosis not present

## 2019-02-04 ENCOUNTER — Encounter: Payer: Self-pay | Admitting: Cardiology

## 2019-02-04 ENCOUNTER — Ambulatory Visit (INDEPENDENT_AMBULATORY_CARE_PROVIDER_SITE_OTHER): Payer: Medicare Other | Admitting: Cardiology

## 2019-02-04 ENCOUNTER — Other Ambulatory Visit: Payer: Self-pay

## 2019-02-04 VITALS — BP 128/66 | HR 64 | Ht 75.0 in | Wt 254.0 lb

## 2019-02-04 DIAGNOSIS — I480 Paroxysmal atrial fibrillation: Secondary | ICD-10-CM

## 2019-02-04 DIAGNOSIS — Z86711 Personal history of pulmonary embolism: Secondary | ICD-10-CM

## 2019-02-04 DIAGNOSIS — G4733 Obstructive sleep apnea (adult) (pediatric): Secondary | ICD-10-CM

## 2019-02-04 NOTE — Addendum Note (Signed)
Addended by: Beckey Rutter on: 02/04/2019 04:49 PM   Modules accepted: Orders

## 2019-02-04 NOTE — Progress Notes (Signed)
Cardiology Office Note:    Date:  02/04/2019   ID:  Ralph Powell, DOB [birth year], MRN [id number]  PCP:  Ralph Roger, MD  Cardiologist:  Ralph Lindau, MD   Referring MD: Ralph Roger, MD    ASSESSMENT:    1. Paroxysmal atrial fibrillation (HCC)   2. History of pulmonary embolism   3. OSA (obstructive sleep apnea)    PLAN:    In order of problems listed above:  1. Paroxysmal atrial fibrillation:I discussed with the patient atrial fibrillation, disease process. Management and therapy including rate and rhythm control, anticoagulation benefits and potential risks were discussed extensively with the patient. Patient had multiple questions which were answered to patient's satisfaction. 2. Since she is on amiodarone therapy I would like him to see a pulmonologist. Sleep health issues were discussed Essential hypertension: Blood pressure is stable Patient will be seen in follow-up appointment in 6 months or earlier if the patient has any concerns   Medication Adjustments/Labs and Tests Ordered: Current medicines are reviewed at length with the patient today.  Concerns regarding medicines are outlined above.  No orders of the defined types were placed in this encounter.  No orders of the defined types were placed in this encounter.    Chief Complaint  Patient presents with  . Follow-up     History of Present Illness:    Ralph Powell is a 73 y.o. male.  Patient has past medical history.  He has history of essential hypertension and dyslipidemia and renal cancer post nephrectomy.  He has renal insufficiency.  He denies any problems at this time and takes care of activities of daily living.  He leads a sedentary lifestyle and uses oxygen for ambulation.  At the time of my evaluation, the patient is alert awake oriented and in no distress.  Patient could not tolerate dronedarone she will continue to be on amiodarone at this time.  This keeps him in sinus rhythm.  At the time of  my evaluation, the patient is alert awake oriented and in no distress.  Past Medical History:  Diagnosis Date  . Arthritis   . COPD (chronic obstructive pulmonary disease) ([place])   . DVT, lower extremity ([place])   . Gout   . History of pulmonary embolus (PE)   . Hyperlipidemia   . Hypertension   . Obesity   . Obstructive sleep apnea   . Renal carcinoma [facility])     Past Surgical History:  Procedure Laterality Date  . NEPHRECTOMY Right   . RIGHT HEART CATH N/A 07/21/2018   Procedure: RIGHT HEART CATH;  Surgeon: Jolaine Artist, MD;  Location: [place] [facility];  Service: Cardiovascular;  Laterality: N/A;    Current Medications: Current Meds  Medication Sig  . acetaminophen (TYLENOL) 325 MG tablet Take 650 mg by mouth daily as needed for mild pain.   Marland Kitchen allopurinol (ZYLOPRIM) 100 MG tablet Take 100 mg by mouth daily.   Marland Kitchen amiodarone (PACERONE) 100 MG tablet Take 1 tablet (100 mg total) by mouth daily.  . bethanechol (URECHOLINE) 50 MG tablet Take 25 mg by mouth 2 (two) times daily.   . Ferrous Sulfate (IRON) 325 (65 Fe) MG TABS Take 1 tablet by mouth daily.  Marland Kitchen gabapentin (NEURONTIN) 100 MG capsule Take 100 mg by mouth 3 (three) times daily.   . Misc Natural Products (TART CHERRY ADVANCED PO) Take 500 mg by mouth 3 (three) times daily.   . rivaroxaban (XARELTO) 20 MG TABS tablet Take  20 mg by mouth daily with supper.   . sennosides-docusate sodium (SENOKOT-S) 8.6-50 MG tablet Take 1 tablet by mouth daily.  Marland Kitchen spironolactone (ALDACTONE) 25 MG tablet Take 0.5 tablets (12.5 mg total) by mouth daily.  . tamsulosin (FLOMAX) 0.4 MG CAPS capsule Take 0.4 mg by mouth daily.   Marland Kitchen torsemide (DEMADEX) 20 MG tablet Take 2 tablets (40 mg total) by mouth daily.     Allergies:   Oxycodone and Codeine   Social History   Socioeconomic History  . Marital status: Married    Spouse name: Not on file  . Number of children: Not on file  . Years of education: Not on file  . Highest education level:  Not on file  Occupational History  . Not on file  Social Needs  . Financial resource strain: Not on file  . Food insecurity    Worry: Not on file    Inability: Not on file  . Transportation needs    Medical: Not on file    Non-medical: Not on file  Tobacco Use  . Smoking status: Former Research scientist (life sciences)  . Smokeless tobacco: Never Used  Substance and Sexual Activity  . Alcohol use: Not Currently    Frequency: Never  . Drug use: Not on file  . Sexual activity: Not on file  Lifestyle  . Physical activity    Days per week: Not on file    Minutes per session: Not on file  . Stress: Not on file  Relationships  . Social Herbalist on phone: Not on file    Gets together: Not on file    Attends religious service: Not on file    Active member of club or organization: Not on file    Attends meetings of clubs or organizations: Not on file    Relationship status: Not on file  Other Topics Concern  . Not on file  Social History Narrative  . Not on file     Family History: The patient's family history is not on file.  ROS:   Please see the history of present illness.    All other systems reviewed and are negative.  EKGs/Labs/Other Studies Reviewed:    The following studies were reviewed today: I reviewed my findings with the patient at length.   Recent Labs: 07/10/2018: NT-Pro BNP 1,888 07/15/2018: ALT 12; B Natriuretic Peptide 1,038.4 07/18/2018: Magnesium 1.8 07/21/2018: Hemoglobin 11.6; Platelets 148 07/22/2018: BUN 22; Creatinine, Ser 1.51; Potassium 3.6; Sodium 141  Recent Lipid Panel No results found for: CHOL, TRIG, HDL, CHOLHDL, VLDL, LDLCALC, LDLDIRECT  Physical Exam:    VS:  BP 128/66 (BP Location: Left Arm, Patient Position: Sitting, Cuff Size: Large)   Pulse 64   Ht _0  (1.905 m)   Wt 254 lb (115.2 kg)   SpO2 98%   BMI 31.75 kg/m     Wt Readings from Last 3 Encounters:  02/04/19 254 lb (115.2 kg)  09/24/18 248 lb (112.5 kg)  08/12/18 239 lb (108.4  kg)     GEN: Patient is in no acute distress HEENT: Normal NECK: No JVD; No carotid bruits LYMPHATICS: No lymphadenopathy CARDIAC: Hear sounds regular, 2/6 systolic murmur at the apex. RESPIRATORY:  Clear to auscultation without rales, wheezing or rhonchi  ABDOMEN: Soft, non-tender, non-distended MUSCULOSKELETAL:  No edema; No deformity  SKIN: Warm and dry NEUROLOGIC:  Alert and oriented x 3 PSYCHIATRIC:  Normal affect   Signed, Ralph Lindau, MD  02/04/2019 4:32 PM  [facility]

## 2019-02-04 NOTE — Patient Instructions (Signed)
Medication Instructions:  Your physician recommends that you continue on your current medications as directed. Please refer to the Current Medication list given to you today.  *If you need a refill on your cardiac medications before your next appointment, please call your pharmacy*  Lab Work: NONE If you have labs (blood work) drawn today and your tests are completely normal, you will receive your results only by: Marland Kitchen MyChart Message (if you have MyChart) OR . A paper copy in the mail If you have any lab test that is abnormal or we need to change your treatment, we will call you to review the results.  Testing/Procedures: You have been referred to pulmonology. They will call to schedule your consultation.   Follow-Up: At [facility], you and your health needs are our priority.  As part of our continuing mission to provide you with exceptional heart care, we have created designated Provider Care Teams.  These Care Teams include your primary Cardiologist (physician) and Advanced Practice Providers (APPs -  Physician Assistants and Nurse Practitioners) who all work together to provide you with the care you need, when you need it.  Your next appointment:   6 months  The format for your next appointment:   In Person  Provider:   Jyl Heinz, MD

## 2019-02-09 ENCOUNTER — Other Ambulatory Visit: Payer: Self-pay | Admitting: Cardiology

## 2019-02-17 DIAGNOSIS — I130 Hypertensive heart and chronic kidney disease with heart failure and stage 1 through stage 4 chronic kidney disease, or unspecified chronic kidney disease: Secondary | ICD-10-CM | POA: Diagnosis not present

## 2019-02-18 DIAGNOSIS — R0602 Shortness of breath: Secondary | ICD-10-CM | POA: Diagnosis not present

## 2019-04-28 DIAGNOSIS — R946 Abnormal results of thyroid function studies: Secondary | ICD-10-CM | POA: Diagnosis not present

## 2019-04-28 DIAGNOSIS — I10 Essential (primary) hypertension: Secondary | ICD-10-CM | POA: Diagnosis not present

## 2019-04-28 DIAGNOSIS — N1832 Chronic kidney disease, stage 3b: Secondary | ICD-10-CM | POA: Diagnosis not present

## 2019-06-28 DIAGNOSIS — N2889 Other specified disorders of kidney and ureter: Secondary | ICD-10-CM | POA: Diagnosis not present

## 2019-06-28 DIAGNOSIS — N401 Enlarged prostate with lower urinary tract symptoms: Secondary | ICD-10-CM | POA: Diagnosis not present

## 2019-06-28 DIAGNOSIS — R339 Retention of urine, unspecified: Secondary | ICD-10-CM | POA: Diagnosis not present

## 2019-07-28 DIAGNOSIS — N1832 Chronic kidney disease, stage 3b: Secondary | ICD-10-CM | POA: Diagnosis not present

## 2019-07-28 DIAGNOSIS — E039 Hypothyroidism, unspecified: Secondary | ICD-10-CM | POA: Diagnosis not present

## 2019-07-28 DIAGNOSIS — I10 Essential (primary) hypertension: Secondary | ICD-10-CM | POA: Diagnosis not present

## 2019-08-17 ENCOUNTER — Other Ambulatory Visit: Payer: Self-pay | Admitting: Family

## 2019-08-26 IMAGING — US US RENAL
1 series · 14 of 25 positions shown · non-contrast
Comparison: None.

CLINICAL DATA: Acute kidney injury

EXAM:
RENAL / URINARY TRACT ULTRASOUND COMPLETE

[Series 1: us renal · 29 acquisitions, 14 frames shown]
[im 1/29]
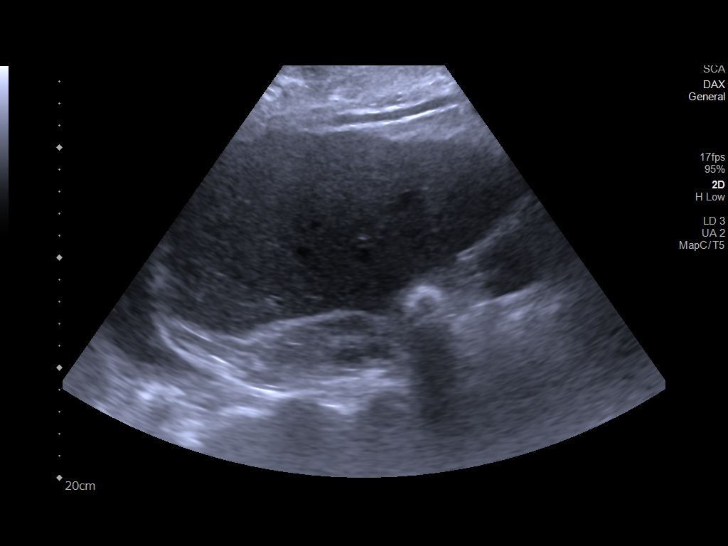
[im 3/29]
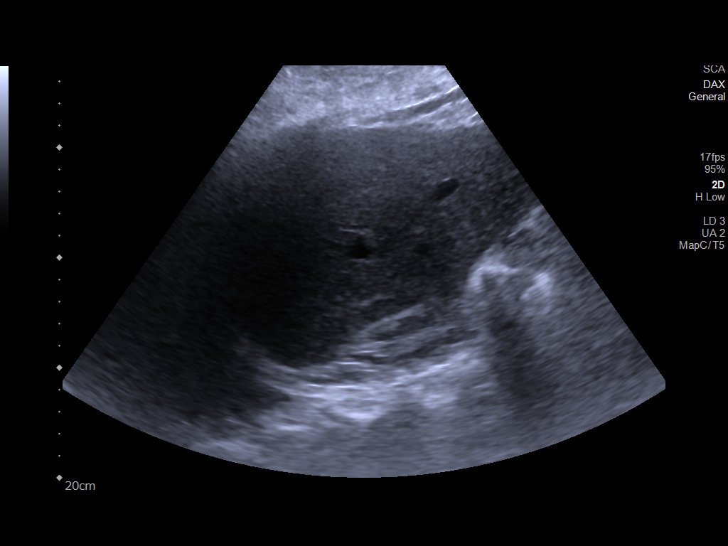
[im 5/29]
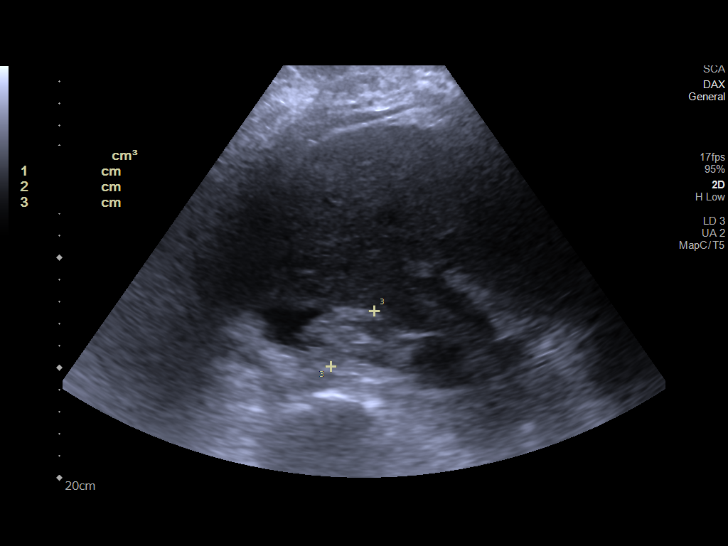
[im 8/29]
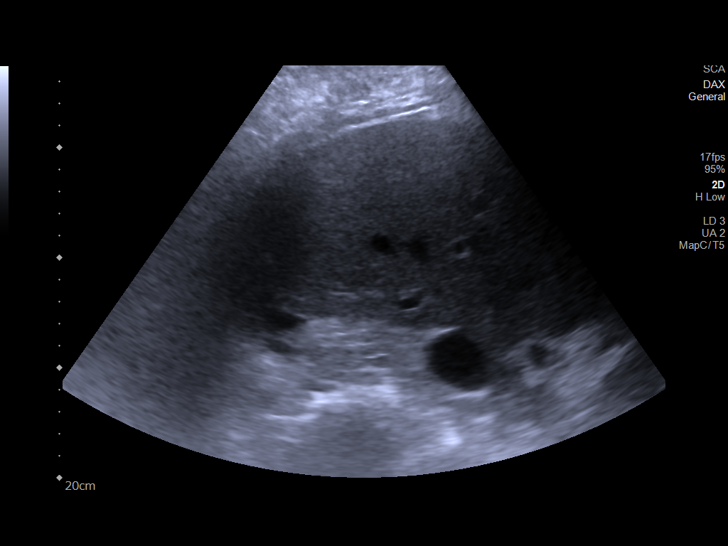
[im 10/29]
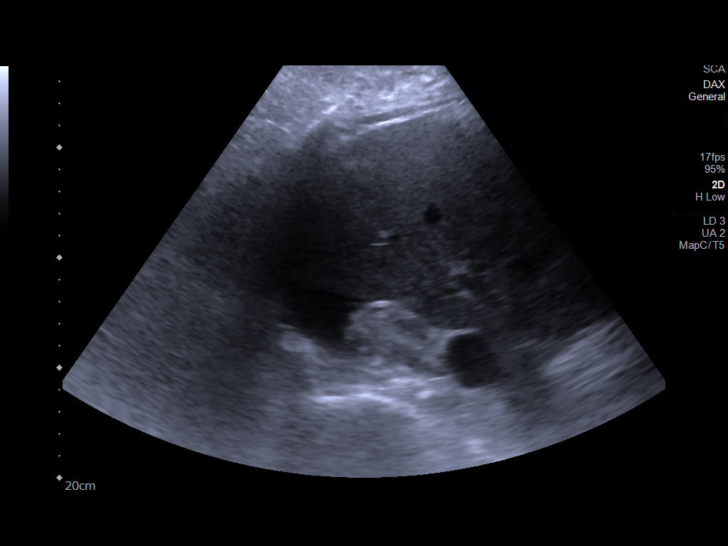
[im 11/29]
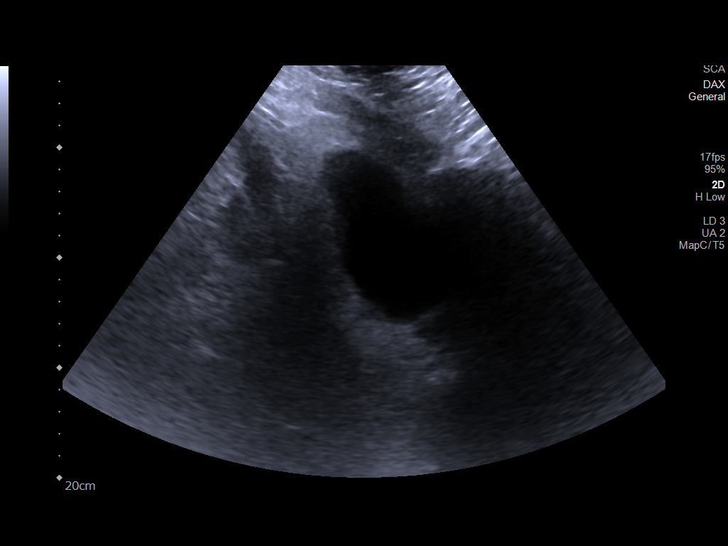
[im 13/29]
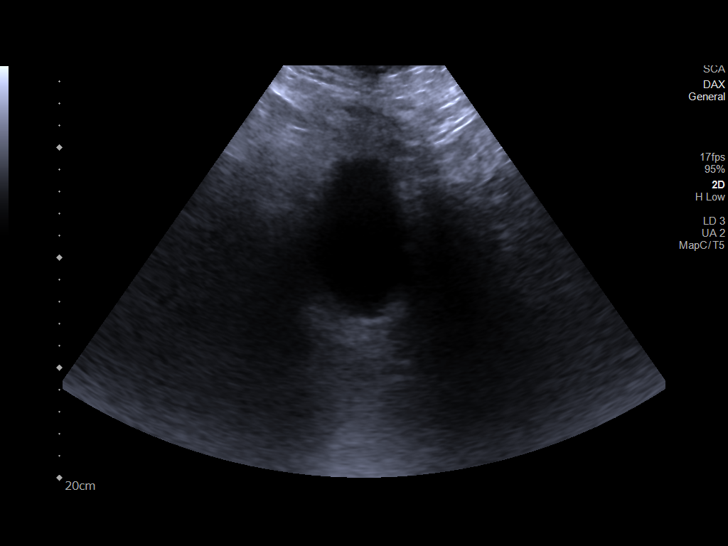
[im 16/29]
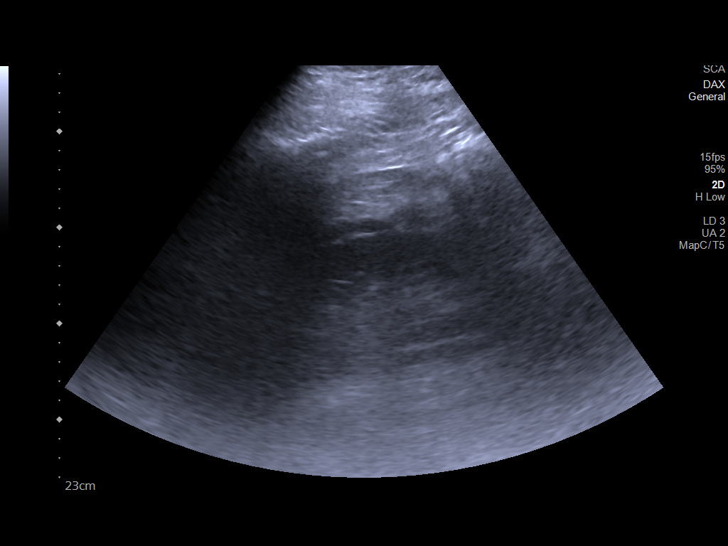
[im 18/29]
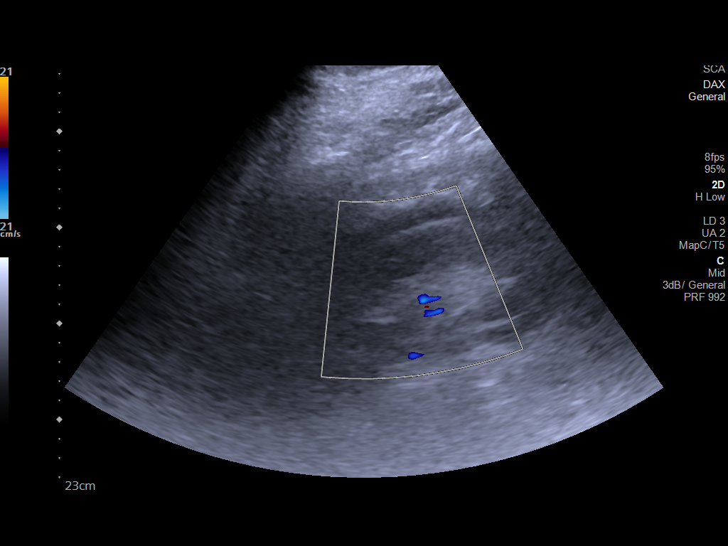
[im 19/29]
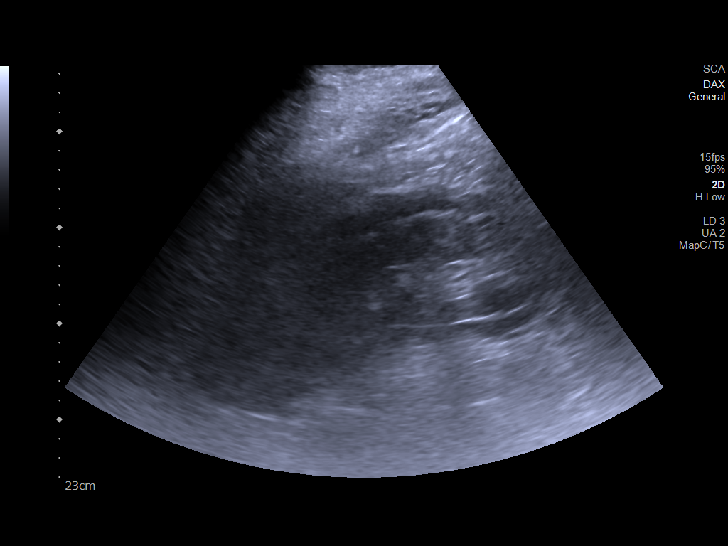
[im 22/29]
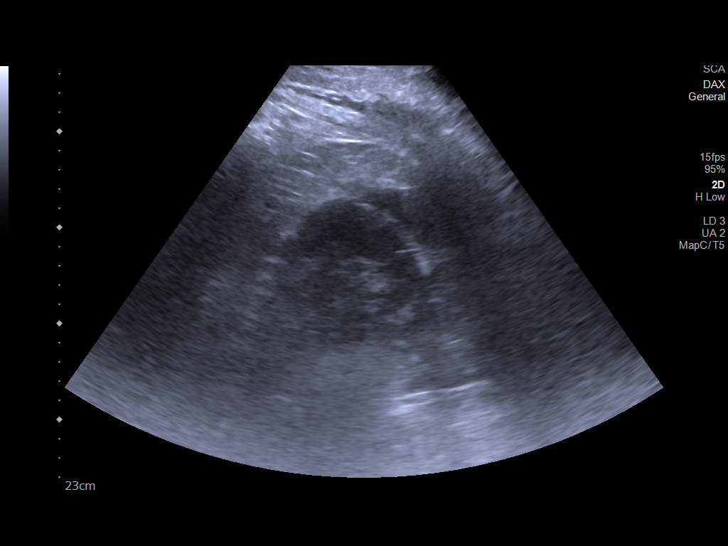
[im 24/29]
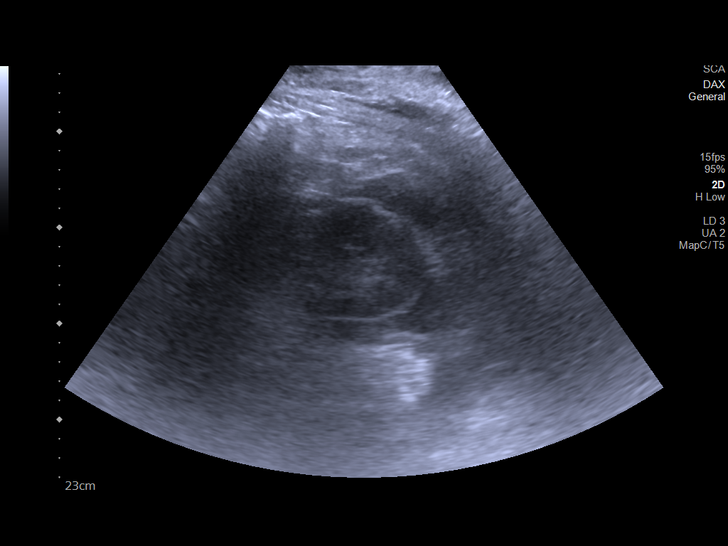
[im 26/29]
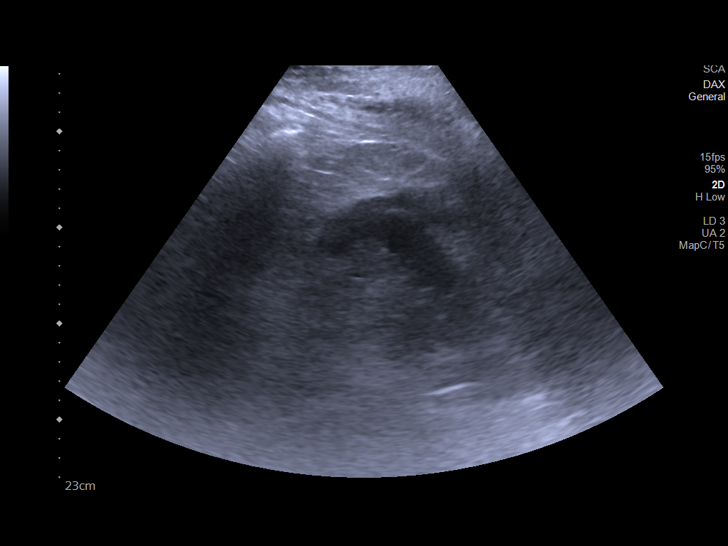
[im 29/29]
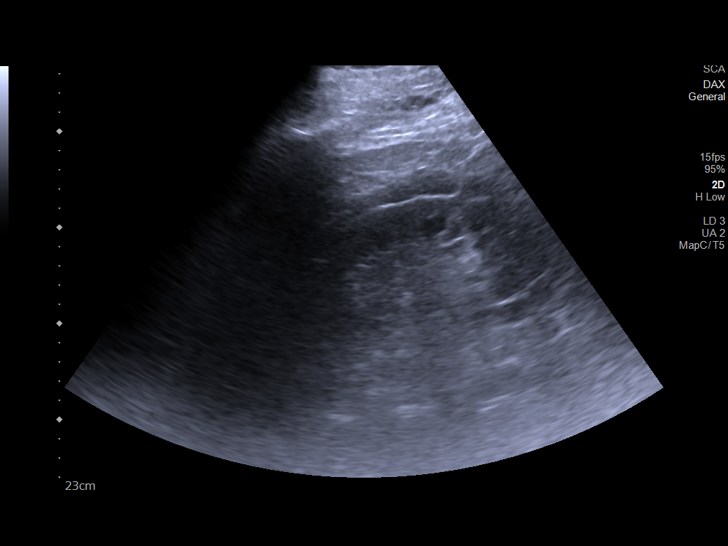

[14 of 25 positions shown; findings below may reference images not displayed]

FINDINGS: Right Kidney:

Right nephrectomy.

Left Kidney:

Renal measurements: 12.4 x 6.1 x 7.1 cm = volume: 282 mL.
Echogenicity within normal limits. No mass or hydronephrosis
visualized.

Bladder:

Appears normal for degree of bladder distention.
IMPRESSION: 1. Prior right nephrectomy.
2. Normal left kidney.

## 2019-10-28 DIAGNOSIS — E039 Hypothyroidism, unspecified: Secondary | ICD-10-CM | POA: Diagnosis not present

## 2019-10-28 DIAGNOSIS — R0602 Shortness of breath: Secondary | ICD-10-CM | POA: Diagnosis not present

## 2019-10-28 DIAGNOSIS — N1832 Chronic kidney disease, stage 3b: Secondary | ICD-10-CM | POA: Diagnosis not present

## 2019-10-28 DIAGNOSIS — I5032 Chronic diastolic (congestive) heart failure: Secondary | ICD-10-CM | POA: Diagnosis not present

## 2019-10-28 DIAGNOSIS — R0600 Dyspnea, unspecified: Secondary | ICD-10-CM | POA: Diagnosis not present

## 2019-10-28 DIAGNOSIS — R079 Chest pain, unspecified: Secondary | ICD-10-CM | POA: Diagnosis not present

## 2019-11-02 ENCOUNTER — Ambulatory Visit: Payer: Medicare Other | Admitting: Cardiology

## 2019-11-03 ENCOUNTER — Ambulatory Visit (INDEPENDENT_AMBULATORY_CARE_PROVIDER_SITE_OTHER): Payer: Medicare Other | Admitting: Cardiology

## 2019-11-03 ENCOUNTER — Other Ambulatory Visit: Payer: Self-pay

## 2019-11-03 VITALS — BP 106/50 | HR 59 | Ht 72.0 in | Wt 266.0 lb

## 2019-11-03 DIAGNOSIS — I480 Paroxysmal atrial fibrillation: Secondary | ICD-10-CM

## 2019-11-03 DIAGNOSIS — J449 Chronic obstructive pulmonary disease, unspecified: Secondary | ICD-10-CM | POA: Diagnosis not present

## 2019-11-03 DIAGNOSIS — Z86711 Personal history of pulmonary embolism: Secondary | ICD-10-CM | POA: Diagnosis not present

## 2019-11-03 DIAGNOSIS — M1990 Unspecified osteoarthritis, unspecified site: Secondary | ICD-10-CM | POA: Insufficient documentation

## 2019-11-03 DIAGNOSIS — G4733 Obstructive sleep apnea (adult) (pediatric): Secondary | ICD-10-CM

## 2019-11-03 DIAGNOSIS — M109 Gout, unspecified: Secondary | ICD-10-CM | POA: Insufficient documentation

## 2019-11-03 DIAGNOSIS — Z86718 Personal history of other venous thrombosis and embolism: Secondary | ICD-10-CM

## 2019-11-03 DIAGNOSIS — C649 Malignant neoplasm of unspecified kidney, except renal pelvis: Secondary | ICD-10-CM | POA: Insufficient documentation

## 2019-11-03 DIAGNOSIS — E785 Hyperlipidemia, unspecified: Secondary | ICD-10-CM | POA: Insufficient documentation

## 2019-11-03 DIAGNOSIS — I10 Essential (primary) hypertension: Secondary | ICD-10-CM | POA: Insufficient documentation

## 2019-11-03 DIAGNOSIS — R079 Chest pain, unspecified: Secondary | ICD-10-CM

## 2019-11-03 DIAGNOSIS — I82409 Acute embolism and thrombosis of unspecified deep veins of unspecified lower extremity: Secondary | ICD-10-CM | POA: Insufficient documentation

## 2019-11-03 DIAGNOSIS — E669 Obesity, unspecified: Secondary | ICD-10-CM | POA: Insufficient documentation

## 2019-11-03 NOTE — Progress Notes (Signed)
Cardiology Office Note:    Date:  11/03/2019   ID:  Ralph Powell, DOB November 09, 1945, MRN [id number]  PCP:  Townsend Roger, MD  Cardiologist:  Jenean Lindau, MD   Referring MD: Townsend Roger, MD    ASSESSMENT:    1. Paroxysmal atrial fibrillation (HCC)   2. History of DVT (deep vein thrombosis)   3. History of pulmonary embolus (PE)   4. OSA (obstructive sleep apnea)    PLAN:    In order of problems listed above:  1. Primary prevention stressed with the patient. Importance of compliance with diet medication stressed and he vocalized understanding. 2. Paroxysmal atrial fibrillation:I discussed with the patient atrial fibrillation, disease process. Management and therapy including rate and rhythm control, anticoagulation benefits and potential risks were discussed extensively with the patient. Patient had multiple questions which were answered to patient's satisfaction. 3. I discussed with the patient about getting a evaluation by pulmonary specialist and is agreeable. I discussed it with him last time but he has not made an appointment for himself. He is COPD and is on low-dose amiodarone. 4. Obstructive sleep apnea: Sleep health issues were discussed 5. Chronic anticoagulation: Benefits and risks explained and he vocalized understanding 6. Overweight: Weight reduction was stressed and he promises to do better. 7. Patient will be seen in follow-up appointment in 6 months or earlier if the patient has any concerns    Medication Adjustments/Labs and Tests Ordered: Current medicines are reviewed at length with the patient today.  Concerns regarding medicines are outlined above.  No orders of the defined types were placed in this encounter.  No orders of the defined types were placed in this encounter.    No chief complaint on file.    History of Present Illness:    Ralph Powell is a 74 y.o. male. Patient has past medical history of paroxysmal atrial fibrillation and  hypertension and obstructive sleep apnea. He has had history of pulmonary embolism. He is on anticoagulation. He denies any problems at this time and takes care of activities of daily living. No chest pain orthopnea or PND. He had an episode of chest pain and his primary care physician felt better chest x-ray and blood work at [facility]. Subsequently seemed fine. He is generally leading a sedentary lifestyle. He is on oxygen at the time for COPD. He is exposed to secondhand smoke. At the time of my evaluation, the patient is alert awake oriented and in no distress.  Past Medical History:  Diagnosis Date  . Abnormal liver function tests 04/26/2014  . Acute cor pulmonale (HCC)   . Acute deep vein thrombosis (DVT) of both lower extremities ([place]) 09/22/2016  . Acute encephalopathy 07/16/2018  . Acute hyponatremia 09/21/2016  . Acute on chronic diastolic (congestive) heart failure ([place])   . Acute on chronic respiratory failure with hypoxia and hypercapnia ([place]) 07/19/2018  . Acute pulmonary embolism ([place]) 09/23/2016  . Acute renal failure ([place])   . AKI (acute kidney injury) ([place]) 07/15/2018  . Alcohol abuse 04/20/2013  . Alcohol withdrawal delirium, acute, hyperactive ([place]) 09/24/2016  . Anemia 09/21/2016  . Arthritis   . Bilateral leg edema 09/21/2016  . COPD (chronic obstructive pulmonary disease) ([place])   . DVT, lower extremity ([place])   . Gout   . Heart failure ([place]) 07/15/2018  . History of DVT (deep vein thrombosis) 06/09/2018  . History of pulmonary embolism 06/09/2018  . History of pulmonary embolus (PE)   . History of renal cell  cancer 09/28/2016  . Hyperlipidemia   . Hypertension   . Obesity   . Obstructive sleep apnea   . OSA (obstructive sleep apnea) 10/03/2016  . Other appendicitis 10/01/2016  . Other specified disorders of kidney and ureter 03/11/2013  . Paroxysmal atrial fibrillation ([place]) 06/09/2018  . Renal carcinoma ([place])   . Renal cell cancer ([place]) 04/20/2013  . SOB (shortness of  breath) 09/21/2016  . SVT (supraventricular tachycardia) ([place]) 09/25/2016    Past Surgical History:  Procedure Laterality Date  . NEPHRECTOMY Right   . RIGHT HEART CATH N/A 07/21/2018   Procedure: RIGHT HEART CATH;  Surgeon: Jolaine Artist, MD;  Location: [place] [facility];  Service: Cardiovascular;  Laterality: N/A;    Current Medications: No outpatient medications have been marked as taking for the 11/03/19 encounter (Office Visit) with Derryck Shahan, Reita Cliche, MD.     Allergies:   Oxycodone and Codeine   Social History   Socioeconomic History  . Marital status: Married    Spouse name: Not on file  . Number of children: Not on file  . Years of education: Not on file  . Highest education level: Not on file  Occupational History  . Not on file  Tobacco Use  . Smoking status: Former Research scientist (life sciences)  . Smokeless tobacco: Never Used  Substance and Sexual Activity  . Alcohol use: Not Currently  . Drug use: Not on file  . Sexual activity: Not on file  Other Topics Concern  . Not on file  Social History Narrative  . Not on file   Social Determinants of Health   Financial Resource Strain:   . Difficulty of Paying Living Expenses:   Food Insecurity:   . Worried About Charity fundraiser in the Last Year:   . Arboriculturist in the Last Year:   Transportation Needs:   . Film/video editor (Medical):   Marland Kitchen Lack of Transportation (Non-Medical):   Physical Activity:   . Days of Exercise per Week:   . Minutes of Exercise per Session:   Stress:   . Feeling of Stress :   Social Connections:   . Frequency of Communication with Friends and Family:   . Frequency of Social Gatherings with Friends and Family:   . Attends Religious Services:   . Active Member of Clubs or Organizations:   . Attends Archivist Meetings:   Marland Kitchen Marital Status:      Family History: The patient's family history is not on file.  ROS:   Please see the history of present illness.    All other  systems reviewed and are negative.  EKGs/Labs/Other Studies Reviewed:    The following studies were reviewed today: EKG reveals sinus rhythm and nonspecific ST-T changes   Recent Labs: No results found for requested labs within last 8760 hours.  Recent Lipid Panel No results found for: CHOL, TRIG, HDL, CHOLHDL, VLDL, LDLCALC, LDLDIRECT  Physical Exam:    VS:  Pulse (!) 59   Ht 6' (1.829 m)   Wt 266 lb (120.7 kg)   SpO2 96% Comment: Pt on home 02 @ 2 liters [place]  BMI 36.08 kg/m     Wt Readings from Last 3 Encounters:  11/03/19 266 lb (120.7 kg)  02/04/19 254 lb (115.2 kg)  09/24/18 248 lb (112.5 kg)     GEN: Patient is in no acute distress HEENT: Normal NECK: No JVD; No carotid bruits LYMPHATICS: No lymphadenopathy CARDIAC: Hear sounds regular, 2/6 systolic  murmur at the apex. RESPIRATORY:  Clear to auscultation without rales, wheezing or rhonchi  ABDOMEN: Soft, non-tender, non-distended MUSCULOSKELETAL:  No edema; No deformity  SKIN: Warm and dry NEUROLOGIC:  Alert and oriented x 3 PSYCHIATRIC:  Normal affect   Signed, Jenean Lindau, MD  11/03/2019 1:38 PM     [facility]

## 2019-11-03 NOTE — Patient Instructions (Signed)
Medication Instructions:  No medication changes. *If you need a refill on your cardiac medications before your next appointment, please call your pharmacy*   Lab Work: None ordered If you have labs (blood work) drawn today and your tests are completely normal, you will receive your results only by: Marland Kitchen MyChart Message (if you have MyChart) OR . A paper copy in the mail If you have any lab test that is abnormal or we need to change your treatment, we will call you to review the results.   Testing/Procedures: Your physician has requested that you have a lexiscan myoview. For further information please visit [email or web address]. Please follow instruction sheet, as given.  The test will take approximately 3 to 4 hours to complete; you may bring reading material.  If someone comes with you to your appointment, they will need to remain in the main lobby due to limited space in the testing area.   How to prepare for your Myocardial Perfusion Test: . Do not eat or drink 3 hours prior to your test, except you may have water. . Do not consume products containing caffeine (regular or decaffeinated) 12 hours prior to your test. (ex: coffee, chocolate, sodas, tea). . Do bring a list of your current medications with you.  If not listed below, you may take your medications as normal. . Do wear comfortable clothes (no dresses or overalls) and walking shoes, tennis shoes preferred (No heels or open toe shoes are allowed). . Do NOT wear cologne, perfume, aftershave, or lotions (deodorant is allowed). . If these instructions are not followed, your test will have to be rescheduled.    Follow-Up: At [facility], you and your health needs are our priority.  As part of our continuing mission to provide you with exceptional heart care, we have created designated Provider Care Teams.  These Care Teams include your primary Cardiologist (physician) and Advanced Practice Providers (APPs -  Physician Assistants and  Nurse Practitioners) who all work together to provide you with the care you need, when you need it.  We recommend signing up for the patient portal called "MyChart".  Sign up information is provided on this After Visit Summary.  MyChart is used to connect with patients for Virtual Visits (Telemedicine).  Patients are able to view lab/test results, encounter notes, upcoming appointments, etc.  Non-urgent messages can be sent to your provider as well.   To learn more about what you can do with MyChart, go to [email or web address].    Your next appointment:   6 month(s)  The format for your next appointment:   In Person  Provider:   Jyl Heinz, MD   Other Instructions  Cardiac Nuclear Scan A cardiac nuclear scan is a test that is done to check the flow of blood to your heart. It is done when you are resting and when you are exercising. The test looks for problems such as:  Not enough blood reaching a portion of the heart.  The heart muscle not working as it should. You may need this test if:  You have heart disease.  You have had lab results that are not normal.  You have had heart surgery or a balloon procedure to open up blocked arteries (angioplasty).  You have chest pain.  You have shortness of breath. In this test, a special dye (tracer) is put into your bloodstream. The tracer will travel to your heart. A camera will then take pictures of your heart to see how the tracer moves through  your heart. This test is usually done at a hospital and takes 2-4 hours. Tell a doctor about:  Any allergies you have.  All medicines you are taking, including vitamins, herbs, eye drops, creams, and over-the-counter medicines.  Any problems you or family members have had with anesthetic medicines.  Any blood disorders you have.  Any surgeries you have had.  Any medical conditions you have.  Whether you are pregnant or may be pregnant. What are the risks? Generally, this is a  safe test. However, problems may occur, such as:  Serious chest pain and heart attack. This is only a risk if the stress portion of the test is done.  Rapid heartbeat.  A feeling of warmth in your chest. This feeling usually does not last long.  Allergic reaction to the tracer. What happens before the test?  Ask your doctor about changing or stopping your normal medicines. This is important.  Follow instructions from your doctor about what you cannot eat or drink.  Remove your jewelry on the day of the test. What happens during the test?  An IV tube will be inserted into one of your veins.  Your doctor will give you a small amount of tracer through the IV tube.  You will wait for 20-40 minutes while the tracer moves through your bloodstream.  Your heart will be monitored with an electrocardiogram (ECG).  You will lie down on an exam table.  Pictures of your heart will be taken for about 15-20 minutes.  You may also have a stress test. For this test, one of these things may be done: ? You will be asked to exercise on a treadmill or a stationary bike. ? You will be given medicines that will make your heart work harder. This is done if you are unable to exercise.  When blood flow to your heart has peaked, a tracer will again be given through the IV tube.  After 20-40 minutes, you will get back on the exam table. More pictures will be taken of your heart.  Depending on the tracer that is used, more pictures may need to be taken 3-4 hours later.  Your IV tube will be removed when the test is over. The test may vary among doctors and hospitals. What happens after the test?  Ask your doctor: ? Whether you can return to your normal schedule, including diet, activities, and medicines. ? Whether you should drink more fluids. This will help to remove the tracer from your body. Drink enough fluid to keep your pee (urine) pale yellow.  Ask your doctor, or the department that is  doing the test: ? When will my results be ready? ? How will I get my results? Summary  A cardiac nuclear scan is a test that is done to check the flow of blood to your heart.  Tell your doctor whether you are pregnant or may be pregnant.  Before the test, ask your doctor about changing or stopping your normal medicines. This is important.  Ask your doctor whether you can return to your normal activities. You may be asked to drink more fluids. This information is not intended to replace advice given to you by your health care provider. Make sure you discuss any questions you have with your health care provider. Document Revised: 07/08/2018 Document Reviewed: 09/01/2017 Elsevier Patient Education  [place].

## 2019-11-04 ENCOUNTER — Telehealth (HOSPITAL_COMMUNITY): Payer: Self-pay | Admitting: *Deleted

## 2019-11-04 NOTE — Telephone Encounter (Signed)
Patient given detailed instructions per Myocardial Perfusion Study Information Sheet for the test on 11/10/19. Patient notified to arrive 15 minutes early and that it is imperative to arrive on time for appointment to keep from having the test rescheduled.  If you need to cancel or reschedule your appointment, please call the office within 24 hours of your appointment. . Patient verbalized understanding. Kirstie Peri

## 2019-11-10 ENCOUNTER — Ambulatory Visit (INDEPENDENT_AMBULATORY_CARE_PROVIDER_SITE_OTHER): Payer: Medicare Other

## 2019-11-10 ENCOUNTER — Other Ambulatory Visit: Payer: Self-pay

## 2019-11-10 DIAGNOSIS — R079 Chest pain, unspecified: Secondary | ICD-10-CM

## 2019-11-10 DIAGNOSIS — I480 Paroxysmal atrial fibrillation: Secondary | ICD-10-CM

## 2019-11-10 LAB — MYOCARDIAL PERFUSION IMAGING
LV dias vol: 151 mL (ref 62–150)
LV sys vol: 60 mL
Peak HR: 65 {beats}/min
Rest HR: 52 {beats}/min
SDS: 2
SRS: 0
SSS: 2
TID: 0.95

## 2019-11-10 MED ORDER — REGADENOSON 0.4 MG/5ML IV SOLN
0.4000 mg | Freq: Once | INTRAVENOUS | Status: AC
  Administered 2019-11-10: 0.4 mg via INTRAVENOUS

## 2019-11-10 MED ORDER — TECHNETIUM TC 99M TETROFOSMIN IV KIT
10.6000 | PACK | Freq: Once | INTRAVENOUS | Status: AC | PRN
  Administered 2019-11-10: 10.6 via INTRAVENOUS

## 2019-11-10 MED ORDER — TECHNETIUM TC 99M TETROFOSMIN IV KIT
32.4000 | PACK | Freq: Once | INTRAVENOUS | Status: AC | PRN
  Administered 2019-11-10: 32.4 via INTRAVENOUS

## 2019-11-12 DIAGNOSIS — I129 Hypertensive chronic kidney disease with stage 1 through stage 4 chronic kidney disease, or unspecified chronic kidney disease: Secondary | ICD-10-CM | POA: Diagnosis not present

## 2019-11-12 DIAGNOSIS — E871 Hypo-osmolality and hyponatremia: Secondary | ICD-10-CM | POA: Diagnosis not present

## 2019-11-12 DIAGNOSIS — N189 Chronic kidney disease, unspecified: Secondary | ICD-10-CM | POA: Diagnosis not present

## 2019-11-12 DIAGNOSIS — K922 Gastrointestinal hemorrhage, unspecified: Secondary | ICD-10-CM | POA: Diagnosis not present

## 2019-11-12 DIAGNOSIS — I5030 Unspecified diastolic (congestive) heart failure: Secondary | ICD-10-CM | POA: Diagnosis not present

## 2019-11-13 DIAGNOSIS — K922 Gastrointestinal hemorrhage, unspecified: Secondary | ICD-10-CM | POA: Diagnosis not present

## 2019-11-13 DIAGNOSIS — I5030 Unspecified diastolic (congestive) heart failure: Secondary | ICD-10-CM | POA: Diagnosis not present

## 2019-11-13 DIAGNOSIS — E871 Hypo-osmolality and hyponatremia: Secondary | ICD-10-CM | POA: Diagnosis not present

## 2019-11-23 DIAGNOSIS — K922 Gastrointestinal hemorrhage, unspecified: Secondary | ICD-10-CM | POA: Diagnosis not present

## 2019-11-23 DIAGNOSIS — E871 Hypo-osmolality and hyponatremia: Secondary | ICD-10-CM | POA: Diagnosis not present

## 2019-11-23 DIAGNOSIS — D649 Anemia, unspecified: Secondary | ICD-10-CM | POA: Diagnosis not present

## 2019-12-02 DIAGNOSIS — E871 Hypo-osmolality and hyponatremia: Secondary | ICD-10-CM | POA: Diagnosis not present

## 2019-12-02 DIAGNOSIS — D649 Anemia, unspecified: Secondary | ICD-10-CM | POA: Diagnosis not present

## 2019-12-22 ENCOUNTER — Institutional Professional Consult (permissible substitution): Payer: Medicare Other | Admitting: Pulmonary Disease

## 2019-12-29 DIAGNOSIS — N2889 Other specified disorders of kidney and ureter: Secondary | ICD-10-CM | POA: Diagnosis not present

## 2019-12-29 DIAGNOSIS — R339 Retention of urine, unspecified: Secondary | ICD-10-CM | POA: Diagnosis not present

## 2019-12-29 DIAGNOSIS — N401 Enlarged prostate with lower urinary tract symptoms: Secondary | ICD-10-CM | POA: Diagnosis not present

## 2020-01-05 DIAGNOSIS — R600 Localized edema: Secondary | ICD-10-CM | POA: Diagnosis not present

## 2020-01-05 DIAGNOSIS — N2889 Other specified disorders of kidney and ureter: Secondary | ICD-10-CM | POA: Diagnosis not present

## 2020-01-05 DIAGNOSIS — I700 Atherosclerosis of aorta: Secondary | ICD-10-CM | POA: Diagnosis not present

## 2020-01-05 DIAGNOSIS — Z905 Acquired absence of kidney: Secondary | ICD-10-CM | POA: Diagnosis not present

## 2020-01-05 DIAGNOSIS — C641 Malignant neoplasm of right kidney, except renal pelvis: Secondary | ICD-10-CM | POA: Diagnosis not present

## 2020-01-05 DIAGNOSIS — K8020 Calculus of gallbladder without cholecystitis without obstruction: Secondary | ICD-10-CM | POA: Diagnosis not present

## 2020-01-05 DIAGNOSIS — K819 Cholecystitis, unspecified: Secondary | ICD-10-CM | POA: Diagnosis not present

## 2020-01-05 DIAGNOSIS — N281 Cyst of kidney, acquired: Secondary | ICD-10-CM | POA: Diagnosis not present

## 2020-01-05 DIAGNOSIS — I2510 Atherosclerotic heart disease of native coronary artery without angina pectoris: Secondary | ICD-10-CM | POA: Diagnosis not present

## 2020-01-26 DIAGNOSIS — L0390 Cellulitis, unspecified: Secondary | ICD-10-CM | POA: Diagnosis not present

## 2020-01-26 DIAGNOSIS — Z23 Encounter for immunization: Secondary | ICD-10-CM | POA: Diagnosis not present

## 2020-02-02 ENCOUNTER — Telehealth: Payer: Self-pay | Admitting: Cardiology

## 2020-02-02 NOTE — Telephone Encounter (Addendum)
I am acting in triage role today to assist with patient callbacks. This patient is followed by Dr. Geraldo Pitter with history of PAF, HTN, OSA, COPD, chronic respiratory failure on home O2, cor pulmonale, DVT, PE, AKI, alcohol abuse, anemia, renal cell carcinoma, HLD, SVT. Last echo 07/2018 showed EF >65% with evidence of right ventricular volume and pressure overload, moderately elevated RVSP, moderately enlarged RV, mild-moderate TR, dilated IVC. Last labs 09/2019 showed Na 126, K 5.1, Cr 1.60, elevated TSH with normal free T4 and normal BNP.  No edema noted on last OV note.  I spoke with caller Vicente Males with [facility] who reports he's had gradual increase in weight and lower extremity edema: 9/22 - 264lb 10/13 - 270lb 10/25 - 270lb This AM - 274lb  2 weeks ago he fell and hit his right leg/shin, developing a blood blister and cellulitis. Was seen by primary care and is on a course of Keflex currently. Right leg is a little more swollen than left, but both are more swollen than Vicente Males had noted on earlier assessments (2+ pitting bilaterally). Wife said he had actually been eating less lately so they do not think it is body weight gain. He has been compliant with Xarelto 49m daily and torsemide 486mdaily as ordered. VS this AM HR 64, BP 134/72. No SOB, orthopnea, palpitations or chest pain.  Given prior labs indicating hyponatremia and renal disease, will route to Dr. ReGeraldo Pitteror further input on diuretic and suggested follow-up. Dr. ReGeraldo Pitterplease route response to P [facility] A[facility]/HP TRIAGE. Thank you!

## 2020-02-02 NOTE — Telephone Encounter (Signed)
Spoke with patient who states that this has been going on since last week. Pt states that he saw his PCP last week for same and was started on antibiotics for the wound on his leg that has caused increased in swelling. Pt states that the swelling is not bad and he has no increased shortness of breath.  Left a VM for Anna with [facility] to callback.

## 2020-02-02 NOTE — Telephone Encounter (Signed)
Pt c/o swelling: STAT is pt has developed SOB within 24 hours  1) How much weight have you gained and in what time span? Ralph Powell with [facility] reports patient has gained 10 lbs since 12/22/19 (264 lbs to 274 lbs).  2) If swelling, where is the swelling located? Both legs, however, Ralph Powell reports patient has cellulitis in his right leg so there is a little more swelling in the right leg.  3) Are you currently taking a fluid pill? Yes, torsemide (DEMADEX) 20 MG tablet   4) Are you currently SOB? No   5) Do you have a log of your daily weights (if so, list)? No   6) Have you gained 3 pounds in a day or 5 pounds in a week? No  7) Have you traveled recently? No

## 2020-02-02 NOTE — Telephone Encounter (Signed)
It is best that he see his primary care physician ASAP and if necessary we can see him if the doctor feels well.  This is in view of his multiple comorbidities.

## 2020-02-04 NOTE — Telephone Encounter (Signed)
Talked with pt today and his swelling is going down, I asked him to see PCP next week to make sure improved.  If further issues he will call us.

## 2020-02-04 NOTE — Telephone Encounter (Signed)
° ° °  Ralph Powell with [facility] returning call

## 2020-02-10 ENCOUNTER — Other Ambulatory Visit: Payer: Self-pay | Admitting: Cardiology

## 2020-02-21 DIAGNOSIS — I83009 Varicose veins of unspecified lower extremity with ulcer of unspecified site: Secondary | ICD-10-CM | POA: Diagnosis not present

## 2020-02-21 DIAGNOSIS — L97909 Non-pressure chronic ulcer of unspecified part of unspecified lower leg with unspecified severity: Secondary | ICD-10-CM | POA: Diagnosis not present

## 2020-02-22 DIAGNOSIS — I872 Venous insufficiency (chronic) (peripheral): Secondary | ICD-10-CM | POA: Diagnosis not present

## 2020-02-22 DIAGNOSIS — I890 Lymphedema, not elsewhere classified: Secondary | ICD-10-CM | POA: Diagnosis not present

## 2020-02-22 DIAGNOSIS — Z86718 Personal history of other venous thrombosis and embolism: Secondary | ICD-10-CM | POA: Diagnosis not present

## 2020-02-22 DIAGNOSIS — I87331 Chronic venous hypertension (idiopathic) with ulcer and inflammation of right lower extremity: Secondary | ICD-10-CM | POA: Diagnosis not present

## 2020-02-29 ENCOUNTER — Telehealth: Payer: Self-pay | Admitting: Cardiology

## 2020-02-29 NOTE — Telephone Encounter (Signed)
    Anna with [facility] requesting to speak with Dr. Enos Fling' nurse

## 2020-02-29 NOTE — Telephone Encounter (Signed)
Left a VM for Vicente Males to callback.

## 2020-03-01 DIAGNOSIS — Z86718 Personal history of other venous thrombosis and embolism: Secondary | ICD-10-CM | POA: Diagnosis not present

## 2020-03-01 DIAGNOSIS — I890 Lymphedema, not elsewhere classified: Secondary | ICD-10-CM | POA: Diagnosis not present

## 2020-03-01 DIAGNOSIS — I872 Venous insufficiency (chronic) (peripheral): Secondary | ICD-10-CM | POA: Diagnosis not present

## 2020-03-01 DIAGNOSIS — I87331 Chronic venous hypertension (idiopathic) with ulcer and inflammation of right lower extremity: Secondary | ICD-10-CM | POA: Diagnosis not present

## 2020-03-03 NOTE — Telephone Encounter (Signed)
Left message for Ralph Powell to callback.

## 2020-03-03 NOTE — Telephone Encounter (Signed)
Ralph Powell with [facility] is returning call.

## 2020-03-08 DIAGNOSIS — I890 Lymphedema, not elsewhere classified: Secondary | ICD-10-CM | POA: Diagnosis not present

## 2020-03-08 DIAGNOSIS — L980 Pyogenic granuloma: Secondary | ICD-10-CM | POA: Diagnosis not present

## 2020-03-08 DIAGNOSIS — Z86718 Personal history of other venous thrombosis and embolism: Secondary | ICD-10-CM | POA: Diagnosis not present

## 2020-03-08 DIAGNOSIS — I872 Venous insufficiency (chronic) (peripheral): Secondary | ICD-10-CM | POA: Diagnosis not present

## 2020-03-08 DIAGNOSIS — I87331 Chronic venous hypertension (idiopathic) with ulcer and inflammation of right lower extremity: Secondary | ICD-10-CM | POA: Diagnosis not present

## 2020-03-08 NOTE — Telephone Encounter (Signed)
Message left for Vicente Males to callback

## 2020-03-08 NOTE — Telephone Encounter (Signed)
Ralph Powell is returning [place] call. Please advise.

## 2020-03-08 NOTE — Telephone Encounter (Signed)
Spoke with Vicente Males who states that the pt has gained 10 pounds since 9/21. She states that the pt has continued swelling in his leg but can't say that the swelling is worse. She states that the patient is not having shortness of breath and is seeing someone for wound on his leg. She states that the pt doe not feel like the weight gain has came from fluid. Vicente Males will reevaluate the pt 12/21 and notify our office if she feels we need to see him in the office.

## 2020-03-15 DIAGNOSIS — I87331 Chronic venous hypertension (idiopathic) with ulcer and inflammation of right lower extremity: Secondary | ICD-10-CM | POA: Diagnosis not present

## 2020-03-15 DIAGNOSIS — I872 Venous insufficiency (chronic) (peripheral): Secondary | ICD-10-CM | POA: Diagnosis not present

## 2020-03-15 DIAGNOSIS — Z86718 Personal history of other venous thrombosis and embolism: Secondary | ICD-10-CM | POA: Diagnosis not present

## 2020-03-15 DIAGNOSIS — I890 Lymphedema, not elsewhere classified: Secondary | ICD-10-CM | POA: Diagnosis not present

## 2020-03-15 DIAGNOSIS — L980 Pyogenic granuloma: Secondary | ICD-10-CM | POA: Diagnosis not present

## 2020-03-21 DIAGNOSIS — I87331 Chronic venous hypertension (idiopathic) with ulcer and inflammation of right lower extremity: Secondary | ICD-10-CM | POA: Diagnosis not present

## 2020-03-21 DIAGNOSIS — L980 Pyogenic granuloma: Secondary | ICD-10-CM | POA: Diagnosis not present

## 2020-03-21 DIAGNOSIS — I872 Venous insufficiency (chronic) (peripheral): Secondary | ICD-10-CM | POA: Diagnosis not present

## 2020-03-21 DIAGNOSIS — Z86718 Personal history of other venous thrombosis and embolism: Secondary | ICD-10-CM | POA: Diagnosis not present

## 2020-03-21 DIAGNOSIS — I890 Lymphedema, not elsewhere classified: Secondary | ICD-10-CM | POA: Diagnosis not present

## 2020-03-29 DIAGNOSIS — I87331 Chronic venous hypertension (idiopathic) with ulcer and inflammation of right lower extremity: Secondary | ICD-10-CM | POA: Diagnosis not present

## 2020-03-29 DIAGNOSIS — I872 Venous insufficiency (chronic) (peripheral): Secondary | ICD-10-CM | POA: Diagnosis not present

## 2020-03-29 DIAGNOSIS — L980 Pyogenic granuloma: Secondary | ICD-10-CM | POA: Diagnosis not present

## 2020-03-29 DIAGNOSIS — Z86718 Personal history of other venous thrombosis and embolism: Secondary | ICD-10-CM | POA: Diagnosis not present

## 2020-03-29 DIAGNOSIS — I890 Lymphedema, not elsewhere classified: Secondary | ICD-10-CM | POA: Diagnosis not present

## 2020-04-05 DIAGNOSIS — I890 Lymphedema, not elsewhere classified: Secondary | ICD-10-CM | POA: Diagnosis not present

## 2020-04-05 DIAGNOSIS — I87331 Chronic venous hypertension (idiopathic) with ulcer and inflammation of right lower extremity: Secondary | ICD-10-CM | POA: Diagnosis not present

## 2020-04-05 DIAGNOSIS — I872 Venous insufficiency (chronic) (peripheral): Secondary | ICD-10-CM | POA: Diagnosis not present

## 2020-04-05 DIAGNOSIS — Z86718 Personal history of other venous thrombosis and embolism: Secondary | ICD-10-CM | POA: Diagnosis not present

## 2020-04-05 DIAGNOSIS — L980 Pyogenic granuloma: Secondary | ICD-10-CM | POA: Diagnosis not present

## 2020-04-11 DIAGNOSIS — N401 Enlarged prostate with lower urinary tract symptoms: Secondary | ICD-10-CM | POA: Diagnosis not present

## 2020-04-11 DIAGNOSIS — N2889 Other specified disorders of kidney and ureter: Secondary | ICD-10-CM | POA: Diagnosis not present

## 2020-04-11 DIAGNOSIS — R339 Retention of urine, unspecified: Secondary | ICD-10-CM | POA: Diagnosis not present

## 2020-04-12 DIAGNOSIS — I872 Venous insufficiency (chronic) (peripheral): Secondary | ICD-10-CM | POA: Diagnosis not present

## 2020-04-12 DIAGNOSIS — Z86718 Personal history of other venous thrombosis and embolism: Secondary | ICD-10-CM | POA: Diagnosis not present

## 2020-04-12 DIAGNOSIS — I87331 Chronic venous hypertension (idiopathic) with ulcer and inflammation of right lower extremity: Secondary | ICD-10-CM | POA: Diagnosis not present

## 2020-04-12 DIAGNOSIS — I890 Lymphedema, not elsewhere classified: Secondary | ICD-10-CM | POA: Diagnosis not present

## 2020-04-14 ENCOUNTER — Other Ambulatory Visit: Payer: Self-pay

## 2020-04-14 ENCOUNTER — Ambulatory Visit (INDEPENDENT_AMBULATORY_CARE_PROVIDER_SITE_OTHER): Payer: Medicare Other | Admitting: Cardiology

## 2020-04-14 ENCOUNTER — Encounter: Payer: Self-pay | Admitting: Cardiology

## 2020-04-14 VITALS — BP 130/60 | HR 62 | Ht 72.0 in | Wt 277.0 lb

## 2020-04-14 DIAGNOSIS — I10 Essential (primary) hypertension: Secondary | ICD-10-CM

## 2020-04-14 DIAGNOSIS — Z86711 Personal history of pulmonary embolism: Secondary | ICD-10-CM | POA: Diagnosis not present

## 2020-04-14 DIAGNOSIS — M7989 Other specified soft tissue disorders: Secondary | ICD-10-CM

## 2020-04-14 DIAGNOSIS — I480 Paroxysmal atrial fibrillation: Secondary | ICD-10-CM | POA: Diagnosis not present

## 2020-04-14 DIAGNOSIS — E782 Mixed hyperlipidemia: Secondary | ICD-10-CM

## 2020-04-14 DIAGNOSIS — R600 Localized edema: Secondary | ICD-10-CM

## 2020-04-14 DIAGNOSIS — G4733 Obstructive sleep apnea (adult) (pediatric): Secondary | ICD-10-CM | POA: Diagnosis not present

## 2020-04-14 MED ORDER — METOLAZONE 2.5 MG PO TABS: 2.5000 mg | ORAL_TABLET | Freq: Every day | ORAL | 3 refills | Status: DC

## 2020-04-14 NOTE — Progress Notes (Signed)
Cardiology Office Note:    Date:  04/14/2020   ID:  Ralph Powell, DOB [birth year], MRN [id number]  PCP:  Townsend Roger, MD  Cardiologist:  Jenean Lindau, MD   Referring MD: Townsend Roger, MD    ASSESSMENT:    1. Paroxysmal atrial fibrillation (HCC)   2. Primary hypertension   3. Mixed hyperlipidemia   4. History of pulmonary embolism   5. History of pulmonary embolus (PE)   6. Obstructive sleep apnea    PLAN:    In order of problems listed above:  1. Primary prevention stressed with the patient. Importance of compliance with diet medication stressed and he vocalized understanding. 2. Paroxysmal atrial fibrillation:I discussed with the patient atrial fibrillation, disease process. Management and therapy including rate and rhythm control, anticoagulation benefits and potential risks were discussed extensively with the patient. Patient had multiple questions which were answered to patient's satisfaction. 3. History of pulmonary embolism and DVT: Patient on anticoagulation. 4. Sleep apnea: Sleep health issues were discussed. 5. Weight gain, shortness of breath and mild pedal edema: I discussed this with him at extensive length. His salt intake and diet issues have not changed. He has a visiting nurse who keeps a close eye on him. In view of this I have stopped his Demadex, I have started metolazone 2.5 mg daily. He will have a Chem-7 and see me in follow-up appointment in 2 weeks. He will check his weight on a regular basis. Echocardiogram will be done to assess murmur heard on auscultation it will also help me assess the right and the left heart in view of his COPD. He knows to go to the nearest emergency room for any significant concerns.   Medication Adjustments/Labs and Tests Ordered: Current medicines are reviewed at length with the patient today.  Concerns regarding medicines are outlined above.  No orders of the defined types were placed in this encounter.  No orders of the  defined types were placed in this encounter.    No chief complaint on file.    History of Present Illness:    Ralph Powell is a 75 y.o. male. Patient has past medical history of paroxysmal atrial fibrillation COPD on oxygen and history of DVT. He has also history of pulmonary embolism. He denies any problems at this time his only issue is that he is gaining weight and some shortness of breath. No orthopnea or PND. He is brought in in a wheelchair. He uses oxygen at the time his wife accompanies him for the visit and is very supportive.  Past Medical History:  Diagnosis Date  . Abnormal liver function tests 04/26/2014  . Acute cor pulmonale (HCC)   . Acute deep vein thrombosis (DVT) of both lower extremities ([place]) 09/22/2016  . Acute encephalopathy 07/16/2018  . Acute hyponatremia 09/21/2016  . Acute on chronic diastolic (congestive) heart failure ([place])   . Acute on chronic respiratory failure with hypoxia and hypercapnia ([place]) 07/19/2018  . Acute pulmonary embolism ([place]) 09/23/2016  . Acute renal failure ([place])   . AKI (acute kidney injury) ([place]) 07/15/2018  . Alcohol abuse 04/20/2013  . Alcohol withdrawal delirium, acute, hyperactive ([place]) 09/24/2016  . Anemia 09/21/2016  . Arthritis   . Bilateral leg edema 09/21/2016  . COPD (chronic obstructive pulmonary disease) ([place])   . DVT, lower extremity ([place])   . Gout   . Heart failure ([place]) 07/15/2018  . History of DVT (deep vein thrombosis) 06/09/2018  . History of pulmonary embolism 06/09/2018  .  History of pulmonary embolus (PE)   . History of renal cell cancer 09/28/2016  . Hyperlipidemia   . Hypertension   . Obesity   . Obstructive sleep apnea   . OSA (obstructive sleep apnea) 10/03/2016  . Other appendicitis 10/01/2016  . Other specified disorders of kidney and ureter 03/11/2013  . Paroxysmal atrial fibrillation ([place]) 06/09/2018  . Renal carcinoma ([place])   . Renal cell cancer ([place]) 04/20/2013  . SOB (shortness of breath) 09/21/2016  . SVT  (supraventricular tachycardia) ([place]) 09/25/2016    Past Surgical History:  Procedure Laterality Date  . NEPHRECTOMY Right   . RIGHT HEART CATH N/A 07/21/2018   Procedure: RIGHT HEART CATH;  Surgeon: Jolaine Artist, MD;  Location: [facility];  Service: Cardiovascular;  Laterality: N/A;    Current Medications: Current Meds  Medication Sig  . acetaminophen (TYLENOL) 325 MG tablet Take 650 mg by mouth daily as needed for mild pain.   Marland Kitchen amLODipine (NORVASC) 5 MG tablet Take 5 mg by mouth daily.  . bethanechol (URECHOLINE) 50 MG tablet Take 50 mg by mouth 2 (two) times daily.  . Ferrous Sulfate (IRON) 325 (65 Fe) MG TABS Take 1 tablet by mouth daily.  Marland Kitchen gabapentin (NEURONTIN) 100 MG capsule Take 100 mg by mouth 2 (two) times daily.  Marland Kitchen gabapentin (NEURONTIN) 300 MG capsule Take 300 mg by mouth at bedtime.  Marland Kitchen lisinopril (ZESTRIL) 5 MG tablet Take 5 mg by mouth daily.  . Misc Natural Products (TART CHERRY ADVANCED PO) Take 500 mg by mouth 3 (three) times daily.   Marland Kitchen PACERONE 100 MG tablet Take 1 tablet by mouth once daily  . rivaroxaban (XARELTO) 20 MG TABS tablet Take 20 mg by mouth daily with supper.   . sennosides-docusate sodium (SENOKOT-S) 8.6-50 MG tablet Take 1 tablet by mouth daily.  . tamsulosin (FLOMAX) 0.4 MG CAPS capsule Take 0.4 mg by mouth daily.   Marland Kitchen torsemide (DEMADEX) 20 MG tablet Take 2 tablets (40 mg total) by mouth daily.     Allergies:   Oxycodone and Codeine   Social History   Socioeconomic History  . Marital status: Married    Spouse name: Not on file  . Number of children: Not on file  . Years of education: Not on file  . Highest education level: Not on file  Occupational History  . Not on file  Tobacco Use  . Smoking status: Former Research scientist (life sciences)  . Smokeless tobacco: Never Used  Substance and Sexual Activity  . Alcohol use: Not Currently  . Drug use: Not on file  . Sexual activity: Not on file  Other Topics Concern  . Not on file  Social History  Narrative  . Not on file   Social Determinants of Health   Financial Resource Strain: Not on file  Food Insecurity: Not on file  Transportation Needs: Not on file  Physical Activity: Not on file  Stress: Not on file  Social Connections: Not on file     Family History: The patient's family history includes Throat cancer in his mother; Valvular heart disease in his father.  ROS:   Please see the history of present illness.    All other systems reviewed and are negative.  EKGs/Labs/Other Studies Reviewed:    The following studies were reviewed today: EKG reveals sinus rhythm and nonspecific ST-T changes   Recent Labs: No results found for requested labs within last 8760 hours.  Recent Lipid Panel No results found for: CHOL, TRIG, HDL,  CHOLHDL, VLDL, LDLCALC, LDLDIRECT  Physical Exam:    VS:  BP 130/60   Pulse 62   Ht 6' (1.829 m)   Wt 277 lb (125.6 kg)   SpO2 99%   BMI 37.57 kg/m     Wt Readings from Last 3 Encounters:  04/14/20 277 lb (125.6 kg)  11/10/19 266 lb (120.7 kg)  11/03/19 266 lb (120.7 kg)     GEN: Patient is in no acute distress HEENT: Normal NECK: No JVD; No carotid bruits LYMPHATICS: No lymphadenopathy CARDIAC: Hear sounds regular, 2/6 systolic murmur at the apex. RESPIRATORY:  Clear to auscultation without rales, wheezing or rhonchi  ABDOMEN: Soft, non-tender, non-distended MUSCULOSKELETAL:  No edema; No deformity  SKIN: Warm and dry NEUROLOGIC:  Alert and oriented x 3 PSYCHIATRIC:  Normal affect   Signed, Jenean Lindau, MD  04/14/2020 12:14 PM    [facility]

## 2020-04-14 NOTE — Patient Instructions (Addendum)
Medication Instructions:  Your physician has recommended you make the following change in your medication:   Stop Torsemide Start metolazone 2.5 mg daily.  *If you need a refill on your cardiac medications before your next appointment, please call your pharmacy*   Lab Work: Your physician recommends that you have labs done in the office today. Your test included  basic metabolic panel and BNP.  If you have labs (blood work) drawn today and your tests are completely normal, you will receive your results only by: Marland Kitchen MyChart Message (if you have MyChart) OR . A paper copy in the mail If you have any lab test that is abnormal or we need to change your treatment, we will call you to review the results.   Testing/Procedures: Your physician has requested that you have an echocardiogram. Echocardiography is a painless test that uses sound waves to create images of your heart. It provides your doctor with information about the size and shape of your heart and how well your heart's chambers and valves are working. This procedure takes approximately one hour. There are no restrictions for this procedure.     Follow-Up: At [facility], you and your health needs are our priority.  As part of our continuing mission to provide you with exceptional heart care, we have created designated Provider Care Teams.  These Care Teams include your primary Cardiologist (physician) and Advanced Practice Providers (APPs -  Physician Assistants and Nurse Practitioners) who all work together to provide you with the care you need, when you need it.  We recommend signing up for the patient portal called "MyChart".  Sign up information is provided on this After Visit Summary.  MyChart is used to connect with patients for Virtual Visits (Telemedicine).  Patients are able to view lab/test results, encounter notes, upcoming appointments, etc.  Non-urgent messages can be sent to your provider as well.   To learn more about  what you can do with MyChart, go to [email or web address].    Your next appointment:   2 week(s)  The format for your next appointment:   In Person  Provider:   Jyl Heinz, MD   Other Instructions Metolazone Oral Tablets What is this medicine? METOLAZONE (me TOLE a zone) is a diuretic. It helps you make more urine and to lose salt and excess water from your body. It treats swelling from heart or kidney disease. It also treats high blood pressure. This medicine may be used for other purposes; ask your health care provider or pharmacist if you have questions. COMMON BRAND NAME(S): Mykrox, Zaroxolyn What should I tell my health care provider before I take this medicine? They need to know if you have any of these conditions:  diabetes  gout  immune system problems, like lupus  kidney disease  liver disease  pancreatitis  small amount of urine or difficulty passing urine  an unusual or allergic reaction to metolazone, sulfa drugs, other medicines, foods, dyes, or preservatives  pregnant or trying to get pregnant  breast-feeding How should I use this medicine? Take this drug by mouth. Take it as directed on the prescription label at the same time every day. Keep taking it unless your health care provider tells you to stop. Talk to your health care provider about the use of this drug in children. Special care may be needed. Overdosage: If you think you have taken too much of this medicine contact a poison control center or emergency room at once. NOTE: This medicine is only  for you. Do not share this medicine with others. What if I miss a dose? If you miss a dose, take it as soon as you can. If it is almost time for your next dose, take only that dose. Do not take double or extra doses. What may interact with this medicine?  alcohol  antiinflammatory drugs for pain or swelling  barbiturates for sleep or seizure  control  digoxin  dofetilide  lithium  medicines for blood sugar  medicines for high blood pressure  medicines that relax muscles for surgery  methenamine  other diuretics  some medicines for pain  steroid hormones like cortisone, hydrocortisone, and prednisone  warfarin This list may not describe all possible interactions. Give your health care provider a list of all the medicines, herbs, non-prescription drugs, or dietary supplements you use. Also tell them if you smoke, drink alcohol, or use illegal drugs. Some items may interact with your medicine. What should I watch for while using this medicine? Visit your doctor or health care professional for regular checks on your progress. Check your blood pressure as directed. Ask your doctor or health care professional what your blood pressure should be and when you should contact him or her. You may need to be on a special diet while taking this medicine. Ask your doctor. Check with your doctor or health care professional if you get an attack of severe diarrhea, nausea and vomiting, or if you sweat a lot. The loss of too much body fluid can make it dangerous for you to take this medicine. You may get drowsy or dizzy. Do not drive, use machinery, or do anything that needs mental alertness until you know how this medicine affects you. Do not stand or sit up quickly, especially if you are an older patient. This reduces the risk of dizzy or fainting spells. Alcohol may interfere with the effect of this medicine. Avoid alcoholic drinks. This medicine may affect your blood sugar level. If you have diabetes, check with your doctor or health care professional before changing the dose of your diabetic medicine. This medicine can make you more sensitive to the sun. Keep out of the sun. If you cannot avoid being in the sun, wear protective clothing and use sunscreen. Do not use sun lamps or tanning beds/booths. What side effects may I notice from  receiving this medicine? Side effects that you should report to your doctor or health care professional as soon as possible:  allergic reactions such as skin rash or itching, hives, swelling of the lips, mouth, tongue, or throat  fast or irregular heartbeat, chest pain  feeling faint  fever, chills  gout pain  hot red lump on leg  muscle pain, cramps  nausea, vomiting  numbness or tingling in hands, feet  pain or difficulty when passing urine  redness, blistering, peeling or loosening of the skin, including inside the mouth  unusual bleeding or bruising  unusually weak or tired  yellowing of the eyes, skin Side effects that usually do not require medical attention (report to your doctor or health care professional if they continue or are bothersome):  abdominal pain  blurred vision  constipation or diarrhea  dry mouth  headache This list may not describe all possible side effects. Call your doctor for medical advice about side effects. You may report side effects to FDA at 1-800-FDA-1088. Where should I keep my medicine? Keep out of the reach of children and pets. Store at room temperature between 20 and 25 degrees  C (68 and 77 degrees F). Protect from light. Throw away any unused drug after the expiration date. NOTE: This sheet is a summary. It may not cover all possible information. If you have questions about this medicine, talk to your doctor, pharmacist, or health care provider.  2021 Elsevier/Gold Standard (2018-10-30 17:51:12)   Echocardiogram An echocardiogram is a test that uses sound waves (ultrasound) to produce images of the heart. Images from an echocardiogram can provide important information about:  Heart size and shape.  The size and thickness and movement of your heart's walls.  Heart muscle function and strength.  Heart valve function or if you have stenosis. Stenosis is when the heart valves are too narrow.  If blood is flowing backward  through the heart valves (regurgitation).  A tumor or infectious growth around the heart valves.  Areas of heart muscle that are not working well because of poor blood flow or injury from a heart attack.  Aneurysm detection. An aneurysm is a weak or damaged part of an artery wall. The wall bulges out from the normal force of blood pumping through the body. Tell a health care provider about:  Any allergies you have.  All medicines you are taking, including vitamins, herbs, eye drops, creams, and over-the-counter medicines.  Any blood disorders you have.  Any surgeries you have had.  Any medical conditions you have.  Whether you are pregnant or may be pregnant. What are the risks? Generally, this is a safe test. However, problems may occur, including an allergic reaction to dye (contrast) that may be used during the test. What happens before the test? No specific preparation is needed. You may eat and drink normally. What happens during the test?  You will take off your clothes from the waist up and put on a hospital gown.  Electrodes or electrocardiogram (ECG)patches may be placed on your chest. The electrodes or patches are then connected to a device that monitors your heart rate and rhythm.  You will lie down on a table for an ultrasound exam. A gel will be applied to your chest to help sound waves pass through your skin.  A handheld device, called a transducer, will be pressed against your chest and moved over your heart. The transducer produces sound waves that travel to your heart and bounce back (or "echo" back) to the transducer. These sound waves will be captured in real-time and changed into images of your heart that can be viewed on a video monitor. The images will be recorded on a computer and reviewed by your health care provider.  You may be asked to change positions or hold your breath for a short time. This makes it easier to get different views or better views of your  heart.  In some cases, you may receive contrast through an IV in one of your veins. This can improve the quality of the pictures from your heart. The procedure may vary among health care providers and hospitals.   What can I expect after the test? You may return to your normal, everyday life, including diet, activities, and medicines, unless your health care provider tells you not to do that. Follow these instructions at home:  It is up to you to get the results of your test. Ask your health care provider, or the department that is doing the test, when your results will be ready.  Keep all follow-up visits. This is important. Summary  An echocardiogram is a test that uses sound waves (  ultrasound) to produce images of the heart.  Images from an echocardiogram can provide important information about the size and shape of your heart, heart muscle function, heart valve function, and other possible heart problems.  You do not need to do anything to prepare before this test. You may eat and drink normally.  After the echocardiogram is completed, you may return to your normal, everyday life, unless your health care provider tells you not to do that. This information is not intended to replace advice given to you by your health care provider. Make sure you discuss any questions you have with your health care provider. Document Revised: 11/09/2019 Document Reviewed: 11/09/2019 Elsevier Patient Education  2021 [employer].

## 2020-04-15 LAB — BASIC METABOLIC PANEL
BUN/Creatinine Ratio: 18 (ref 10–24)
BUN: 25 mg/dL (ref 8–27)
CO2: 36 mmol/L — ABNORMAL HIGH (ref 20–29)
Calcium: 8.7 mg/dL (ref 8.6–10.2)
Chloride: 92 mmol/L — ABNORMAL LOW (ref 96–106)
Creatinine, Ser: 1.37 mg/dL — ABNORMAL HIGH (ref 0.76–1.27)
GFR calc Af Amer: 59 mL/min/{1.73_m2} — ABNORMAL LOW (ref >59)
GFR calc non Af Amer: 51 mL/min/{1.73_m2} — ABNORMAL LOW (ref >59)
Glucose: 100 mg/dL — ABNORMAL HIGH (ref 65–99)
Potassium: 4.6 mmol/L (ref 3.5–5.2)
Sodium: 142 mmol/L (ref 134–144)

## 2020-04-15 LAB — BRAIN NATRIURETIC PEPTIDE: BNP: 125.6 pg/mL — ABNORMAL HIGH (ref 0.0–100.0)

## 2020-04-17 ENCOUNTER — Telehealth: Payer: Self-pay | Admitting: Cardiology

## 2020-04-17 NOTE — Telephone Encounter (Signed)
Pt c/o medication issue:  1. Name of Medication: metolazone (ZAROXOLYN) 2.5 MG tablet  2. How are you currently taking this medication (dosage and times per day)? 1 tablet daily  3. Are you having a reaction (difficulty breathing--STAT)? no  4. What is your medication issue? Patient's wife states the patient started the medication 2 days ago and it is not helping. She states he is urinating less.

## 2020-04-17 NOTE — Telephone Encounter (Signed)
Patient's wife returning call.

## 2020-04-17 NOTE — Telephone Encounter (Signed)
Left vm for pt to callback

## 2020-04-17 NOTE — Telephone Encounter (Signed)
Pt's wife states that the metolazone doesn't seem to be working any better than the lasix because the pt has not been voiding as much as he was before the medication change. Pt is not having any more shortness of breath or swelling before the change.

## 2020-04-18 NOTE — Telephone Encounter (Signed)
Patient's wife calling back. She states the patient has only urinated 4 oz today.

## 2020-04-18 NOTE — Telephone Encounter (Signed)
Called patient. Informed him per Dr. Geraldo Pitter to switch back to lasix and 80 mg daily until Friday since he used to take 40 mg daily and we are doubling. He will call and let us know If this helped on Friday.

## 2020-04-18 NOTE — Telephone Encounter (Signed)
Go back to lasix and double dose till friday

## 2020-04-21 ENCOUNTER — Telehealth: Payer: Self-pay | Admitting: Cardiology

## 2020-04-21 DIAGNOSIS — I2510 Atherosclerotic heart disease of native coronary artery without angina pectoris: Secondary | ICD-10-CM | POA: Diagnosis not present

## 2020-04-21 DIAGNOSIS — J189 Pneumonia, unspecified organism: Secondary | ICD-10-CM | POA: Diagnosis not present

## 2020-04-21 DIAGNOSIS — N401 Enlarged prostate with lower urinary tract symptoms: Secondary | ICD-10-CM | POA: Diagnosis not present

## 2020-04-21 DIAGNOSIS — K8020 Calculus of gallbladder without cholecystitis without obstruction: Secondary | ICD-10-CM | POA: Diagnosis not present

## 2020-04-21 DIAGNOSIS — R109 Unspecified abdominal pain: Secondary | ICD-10-CM | POA: Diagnosis not present

## 2020-04-21 DIAGNOSIS — N2889 Other specified disorders of kidney and ureter: Secondary | ICD-10-CM | POA: Diagnosis not present

## 2020-04-21 DIAGNOSIS — J9811 Atelectasis: Secondary | ICD-10-CM | POA: Diagnosis not present

## 2020-04-21 DIAGNOSIS — J9611 Chronic respiratory failure with hypoxia: Secondary | ICD-10-CM | POA: Diagnosis not present

## 2020-04-21 DIAGNOSIS — R0602 Shortness of breath: Secondary | ICD-10-CM | POA: Diagnosis not present

## 2020-04-21 DIAGNOSIS — N179 Acute kidney failure, unspecified: Secondary | ICD-10-CM | POA: Diagnosis not present

## 2020-04-21 DIAGNOSIS — J80 Acute respiratory distress syndrome: Secondary | ICD-10-CM | POA: Diagnosis not present

## 2020-04-21 DIAGNOSIS — J90 Pleural effusion, not elsewhere classified: Secondary | ICD-10-CM | POA: Diagnosis not present

## 2020-04-21 DIAGNOSIS — Z85528 Personal history of other malignant neoplasm of kidney: Secondary | ICD-10-CM | POA: Diagnosis not present

## 2020-04-21 DIAGNOSIS — N281 Cyst of kidney, acquired: Secondary | ICD-10-CM | POA: Diagnosis not present

## 2020-04-21 NOTE — Telephone Encounter (Signed)
     Pt's wife calling back he said after increasing pt's lasix, it worked a little bit but today, pt stop urinating again. She would like to speak with Dr. Julien Nordmann nurse again

## 2020-04-21 NOTE — Telephone Encounter (Signed)
Pt states that he has not voided since 0600 this am and it was only a small amount at that time. Pt reports being uncomfortable needing to urinate but unable to. Pt states that he has increased shortness of breath and rapid heart rate. Pt has been advised to go to the ED for evaluation. Pt verbalized understanding and had no additional questions.

## 2020-04-22 DIAGNOSIS — N401 Enlarged prostate with lower urinary tract symptoms: Secondary | ICD-10-CM | POA: Diagnosis not present

## 2020-04-22 DIAGNOSIS — J189 Pneumonia, unspecified organism: Secondary | ICD-10-CM | POA: Diagnosis not present

## 2020-04-22 DIAGNOSIS — J9611 Chronic respiratory failure with hypoxia: Secondary | ICD-10-CM | POA: Diagnosis not present

## 2020-04-23 DIAGNOSIS — J189 Pneumonia, unspecified organism: Secondary | ICD-10-CM | POA: Diagnosis not present

## 2020-04-23 DIAGNOSIS — J9611 Chronic respiratory failure with hypoxia: Secondary | ICD-10-CM | POA: Diagnosis not present

## 2020-04-23 DIAGNOSIS — N401 Enlarged prostate with lower urinary tract symptoms: Secondary | ICD-10-CM | POA: Diagnosis not present

## 2020-04-24 DIAGNOSIS — N401 Enlarged prostate with lower urinary tract symptoms: Secondary | ICD-10-CM | POA: Diagnosis not present

## 2020-04-24 DIAGNOSIS — J9611 Chronic respiratory failure with hypoxia: Secondary | ICD-10-CM | POA: Diagnosis not present

## 2020-04-24 DIAGNOSIS — J189 Pneumonia, unspecified organism: Secondary | ICD-10-CM | POA: Diagnosis not present

## 2020-04-24 DIAGNOSIS — I509 Heart failure, unspecified: Secondary | ICD-10-CM | POA: Diagnosis not present

## 2020-04-24 DIAGNOSIS — J90 Pleural effusion, not elsewhere classified: Secondary | ICD-10-CM | POA: Diagnosis not present

## 2020-04-24 DIAGNOSIS — J9811 Atelectasis: Secondary | ICD-10-CM | POA: Diagnosis not present

## 2020-04-25 DIAGNOSIS — J189 Pneumonia, unspecified organism: Secondary | ICD-10-CM | POA: Diagnosis not present

## 2020-04-25 DIAGNOSIS — N401 Enlarged prostate with lower urinary tract symptoms: Secondary | ICD-10-CM | POA: Diagnosis not present

## 2020-04-25 DIAGNOSIS — J9611 Chronic respiratory failure with hypoxia: Secondary | ICD-10-CM | POA: Diagnosis not present

## 2020-04-26 DIAGNOSIS — J9611 Chronic respiratory failure with hypoxia: Secondary | ICD-10-CM | POA: Diagnosis not present

## 2020-04-26 DIAGNOSIS — J189 Pneumonia, unspecified organism: Secondary | ICD-10-CM | POA: Diagnosis not present

## 2020-04-26 DIAGNOSIS — N401 Enlarged prostate with lower urinary tract symptoms: Secondary | ICD-10-CM | POA: Diagnosis not present

## 2020-05-02 ENCOUNTER — Telehealth: Payer: Self-pay | Admitting: Cardiology

## 2020-05-02 NOTE — Telephone Encounter (Signed)
Anna with [facility] wanted to confirm that the pt is taking torsemide 20 mg twice daily. Noted in chart that the pt was on lasix 40 mg daily. Pt has an appt on 05/05/20. Pt will continue on medication hat he was discharged with from the hospital.

## 2020-05-02 NOTE — Telephone Encounter (Signed)
Ann from [facility] called in and stated that was release from the hosp on 1/26 and she was out to see him today .  They are unsure how he is suppose to be taking the toresmide that they gave him in the hosp  Best number for ann is [phone]

## 2020-05-04 ENCOUNTER — Other Ambulatory Visit: Payer: Self-pay

## 2020-05-04 ENCOUNTER — Ambulatory Visit (INDEPENDENT_AMBULATORY_CARE_PROVIDER_SITE_OTHER): Payer: Medicare Other | Admitting: Cardiology

## 2020-05-04 ENCOUNTER — Encounter: Payer: Self-pay | Admitting: Cardiology

## 2020-05-04 VITALS — BP 122/68 | HR 70 | Ht 72.0 in | Wt 257.0 lb

## 2020-05-04 DIAGNOSIS — I480 Paroxysmal atrial fibrillation: Secondary | ICD-10-CM | POA: Diagnosis not present

## 2020-05-04 DIAGNOSIS — J431 Panlobular emphysema: Secondary | ICD-10-CM | POA: Diagnosis not present

## 2020-05-04 DIAGNOSIS — I10 Essential (primary) hypertension: Secondary | ICD-10-CM

## 2020-05-04 DIAGNOSIS — G4733 Obstructive sleep apnea (adult) (pediatric): Secondary | ICD-10-CM | POA: Diagnosis not present

## 2020-05-04 DIAGNOSIS — Z86711 Personal history of pulmonary embolism: Secondary | ICD-10-CM | POA: Diagnosis not present

## 2020-05-04 DIAGNOSIS — E782 Mixed hyperlipidemia: Secondary | ICD-10-CM | POA: Diagnosis not present

## 2020-05-04 NOTE — Progress Notes (Signed)
Cardiology Office Note:    Date:  05/04/2020   ID:  Ralph Powell, DOB 1945/10/14, MRN [id number]  PCP:  Townsend Roger, MD  Cardiologist:  Jenean Lindau, MD   Referring MD: Townsend Roger, MD    ASSESSMENT:    1. Paroxysmal atrial fibrillation (HCC)   2. Primary hypertension   3. Mixed hyperlipidemia   4. OSA (obstructive sleep apnea)   5. History of pulmonary embolism   6. Panlobular emphysema ([place])    PLAN:    In order of problems listed above:  1. Primary prevention stressed with the patient.  Importance of compliance with diet medication stressed he vocalized understanding.  His wife accompanies him for this visit. 2. Paroxysmal atrial fibrillation:I discussed with the patient atrial fibrillation, disease process. Management and therapy including rate and rhythm control, anticoagulation benefits and potential risks were discussed extensively with the patient. Patient had multiple questions which were answered to patient's satisfaction. 3. Essential hypertension: Blood pressure stable and diet was emphasized. 4. Preserved systolic function congestive heart failure: Congestive heart failure education was discussed with the patient.  Diet was discussed including salt intake issues.  He weighs himself on a regular basis.  Questions were answered to his satisfaction.  He will have a Chem-7 today. 5. Sleep apnea: Sleep health issues were discussed. 6. Patient will be seen in follow-up appointment in 1 months or earlier if the patient has any concerns    Medication Adjustments/Labs and Tests Ordered: Current medicines are reviewed at length with the patient today.  Concerns regarding medicines are outlined above.  No orders of the defined types were placed in this encounter.  No orders of the defined types were placed in this encounter.    No chief complaint on file.    History of Present Illness:    Ralph Powell is a 75 y.o. male.  Patient has past medical history of  COPD on oxygen, history of acute pulmonary embolism, systolic function preserved with congestive heart failure.  He denies any problems at this time.  He went to [facility] [facility] and was found to have pneumonia and treated and released.  Subsequently is done fine.  He has lost significant amount of weight.  No chest pain orthopnea or PND.  He has felt the best he has felt in many months.  At the time of my evaluation, the patient is alert awake oriented and in no distress.  Past Medical History:  Diagnosis Date  . Abnormal liver function tests 04/26/2014  . Acute cor pulmonale (HCC)   . Acute deep vein thrombosis (DVT) of both lower extremities ([place]) 09/22/2016  . Acute encephalopathy 07/16/2018  . Acute hyponatremia 09/21/2016  . Acute on chronic diastolic (congestive) heart failure ([place])   . Acute on chronic respiratory failure with hypoxia and hypercapnia ([place]) 07/19/2018  . Acute pulmonary embolism ([place]) 09/23/2016  . Acute renal failure ([place])   . AKI (acute kidney injury) ([place]) 07/15/2018  . Alcohol abuse 04/20/2013  . Alcohol withdrawal delirium, acute, hyperactive ([place]) 09/24/2016  . Anemia 09/21/2016  . Arthritis   . Bilateral leg edema 09/21/2016  . COPD (chronic obstructive pulmonary disease) ([place])   . DVT, lower extremity ([place])   . Gout   . Heart failure ([place]) 07/15/2018  . History of DVT (deep vein thrombosis) 06/09/2018  . History of pulmonary embolism 06/09/2018  . History of pulmonary embolus (PE)   . History of renal cell cancer 09/28/2016  . Hyperlipidemia   . Hypertension   .  Obesity   . Obstructive sleep apnea   . OSA (obstructive sleep apnea) 10/03/2016  . Other appendicitis 10/01/2016  . Other specified disorders of kidney and ureter 03/11/2013  . Paroxysmal atrial fibrillation ([place]) 06/09/2018  . Renal carcinoma ([place])   . Renal cell cancer ([place]) 04/20/2013  . SOB (shortness of breath) 09/21/2016  . SVT (supraventricular tachycardia) ([place]) 09/25/2016    Past Surgical History:   Procedure Laterality Date  . NEPHRECTOMY Right   . RIGHT HEART CATH N/A 07/21/2018   Procedure: RIGHT HEART CATH;  Surgeon: Jolaine Artist, MD;  Location: [facility];  Service: Cardiovascular;  Laterality: N/A;    Current Medications: Current Meds  Medication Sig  . acetaminophen (TYLENOL) 325 MG tablet Take 650 mg by mouth daily as needed for mild pain.   Marland Kitchen amLODipine (NORVASC) 5 MG tablet Take 5 mg by mouth daily.  . bethanechol (URECHOLINE) 50 MG tablet Take 50 mg by mouth 2 (two) times daily.  . Ferrous Sulfate (IRON) 325 (65 Fe) MG TABS Take 1 tablet by mouth daily.  Marland Kitchen gabapentin (NEURONTIN) 100 MG capsule Take 100 mg by mouth 2 (two) times daily.  Marland Kitchen gabapentin (NEURONTIN) 300 MG capsule Take 300 mg by mouth at bedtime.  . Misc Natural Products (TART CHERRY ADVANCED PO) Take 500 mg by mouth 3 (three) times daily.   Marland Kitchen PACERONE 100 MG tablet Take 1 tablet by mouth once daily  . pantoprazole (PROTONIX) 40 MG tablet Take 40 mg by mouth daily.  . rivaroxaban (XARELTO) 20 MG TABS tablet Take 20 mg by mouth daily with supper.   . sennosides-docusate sodium (SENOKOT-S) 8.6-50 MG tablet Take 1 tablet by mouth daily.  . tamsulosin (FLOMAX) 0.4 MG CAPS capsule Take 0.4 mg by mouth daily.   Marland Kitchen torsemide (DEMADEX) 20 MG tablet Take 40 mg by mouth 2 (two) times daily.     Allergies:   Oxycodone and Codeine   Social History   Socioeconomic History  . Marital status: Married    Spouse name: Not on file  . Number of children: Not on file  . Years of education: Not on file  . Highest education level: Not on file  Occupational History  . Not on file  Tobacco Use  . Smoking status: Former Research scientist (life sciences)  . Smokeless tobacco: Never Used  Substance and Sexual Activity  . Alcohol use: Not Currently  . Drug use: Not on file  . Sexual activity: Not on file  Other Topics Concern  . Not on file  Social History Narrative  . Not on file   Social Determinants of Health   Financial  Resource Strain: Not on file  Food Insecurity: Not on file  Transportation Needs: Not on file  Physical Activity: Not on file  Stress: Not on file  Social Connections: Not on file     Family History: The patient's family history includes Throat cancer in his mother; Valvular heart disease in his father.  ROS:   Please see the history of present illness.    All other systems reviewed and are negative.  EKGs/Labs/Other Studies Reviewed:    The following studies were reviewed today: EKG reveals sinus rhythm with nonspecific ST-T changes. I reviewed records from [facility] hospital extensively.  This was emergency room and admission discharge records.  This is   Recent Labs: 04/14/2020: BNP 125.6; BUN 25; Creatinine, Ser 1.37; Potassium 4.6; Sodium 142  Recent Lipid Panel No results found for: CHOL, TRIG, HDL, CHOLHDL, VLDL, LDLCALC,  LDLDIRECT  Physical Exam:    VS:  BP 122/68   Pulse 70   Ht 6' (1.829 m)   Wt 257 lb (116.6 kg)   SpO2 96%   BMI 34.86 kg/m     Wt Readings from Last 3 Encounters:  05/04/20 257 lb (116.6 kg)  04/14/20 277 lb (125.6 kg)  11/10/19 266 lb (120.7 kg)     GEN: Patient is in no acute distress HEENT: Normal NECK: No JVD; No carotid bruits LYMPHATICS: No lymphadenopathy CARDIAC: Hear sounds regular, 2/6 systolic murmur at the apex. RESPIRATORY:  Clear to auscultation without rales, wheezing or rhonchi  ABDOMEN: Soft, non-tender, non-distended MUSCULOSKELETAL:  No edema; No deformity  SKIN: Warm and dry NEUROLOGIC:  Alert and oriented x 3 PSYCHIATRIC:  Normal affect   Signed, Jenean Lindau, MD  05/04/2020 2:05 PM    [facility]

## 2020-05-04 NOTE — Patient Instructions (Addendum)
Medication Instructions:  No medication changes. *If you need a refill on your cardiac medications before your next appointment, please call your pharmacy*   Lab Work: Your physician recommends that you have a BMET today in the office.  If you have labs (blood work) drawn today and your tests are completely normal, you will receive your results only by: Marland Kitchen MyChart Message (if you have MyChart) OR . A paper copy in the mail If you have any lab test that is abnormal or we need to change your treatment, we will call you to review the results.   Testing/Procedures: None ordered   Follow-Up: At [facility], you and your health needs are our priority.  As part of our continuing mission to provide you with exceptional heart care, we have created designated Provider Care Teams.  These Care Teams include your primary Cardiologist (physician) and Advanced Practice Providers (APPs -  Physician Assistants and Nurse Practitioners) who all work together to provide you with the care you need, when you need it.  We recommend signing up for the patient portal called "MyChart".  Sign up information is provided on this After Visit Summary.  MyChart is used to connect with patients for Virtual Visits (Telemedicine).  Patients are able to view lab/test results, encounter notes, upcoming appointments, etc.  Non-urgent messages can be sent to your provider as well.   To learn more about what you can do with MyChart, go to [email or web address].    Your next appointment:   3 week(s)  The format for your next appointment:   In Person  Provider:   Jyl Heinz, MD   Other Instructions NA

## 2020-05-04 NOTE — Addendum Note (Signed)
Addended by: Truddie Hidden on: 05/04/2020 02:16 PM   Modules accepted: Orders

## 2020-05-05 LAB — BASIC METABOLIC PANEL
BUN/Creatinine Ratio: 17 (ref 10–24)
BUN: 30 mg/dL — ABNORMAL HIGH (ref 8–27)
CO2: 30 mmol/L — ABNORMAL HIGH (ref 20–29)
Calcium: 8.9 mg/dL (ref 8.6–10.2)
Chloride: 90 mmol/L — ABNORMAL LOW (ref 96–106)
Creatinine, Ser: 1.77 mg/dL — ABNORMAL HIGH (ref 0.76–1.27)
GFR calc Af Amer: 43 mL/min/{1.73_m2} — ABNORMAL LOW (ref >59)
GFR calc non Af Amer: 37 mL/min/{1.73_m2} — ABNORMAL LOW (ref >59)
Glucose: 96 mg/dL (ref 65–99)
Potassium: 4.4 mmol/L (ref 3.5–5.2)
Sodium: 137 mmol/L (ref 134–144)

## 2020-05-09 ENCOUNTER — Ambulatory Visit: Payer: Medicare Other | Admitting: Cardiology

## 2020-05-12 ENCOUNTER — Other Ambulatory Visit: Payer: Self-pay

## 2020-05-12 ENCOUNTER — Ambulatory Visit (INDEPENDENT_AMBULATORY_CARE_PROVIDER_SITE_OTHER): Payer: Medicare Other

## 2020-05-12 DIAGNOSIS — I480 Paroxysmal atrial fibrillation: Secondary | ICD-10-CM

## 2020-05-12 DIAGNOSIS — E782 Mixed hyperlipidemia: Secondary | ICD-10-CM | POA: Diagnosis not present

## 2020-05-12 LAB — ECHOCARDIOGRAM COMPLETE: S' Lateral: 3.4 cm

## 2020-05-12 NOTE — Progress Notes (Signed)
Complete echocardiogram performed.  Jimmy Harim Bi RDCS, RVT

## 2020-05-27 DIAGNOSIS — N1831 Chronic kidney disease, stage 3a: Secondary | ICD-10-CM | POA: Diagnosis not present

## 2020-05-27 DIAGNOSIS — J189 Pneumonia, unspecified organism: Secondary | ICD-10-CM | POA: Diagnosis not present

## 2020-05-27 DIAGNOSIS — I5022 Chronic systolic (congestive) heart failure: Secondary | ICD-10-CM | POA: Diagnosis not present

## 2020-05-31 ENCOUNTER — Other Ambulatory Visit: Payer: Self-pay

## 2020-05-31 DIAGNOSIS — I714 Abdominal aortic aneurysm, without rupture, unspecified: Secondary | ICD-10-CM

## 2020-05-31 DIAGNOSIS — M1710 Unilateral primary osteoarthritis, unspecified knee: Secondary | ICD-10-CM

## 2020-05-31 DIAGNOSIS — Z1211 Encounter for screening for malignant neoplasm of colon: Secondary | ICD-10-CM

## 2020-05-31 DIAGNOSIS — M179 Osteoarthritis of knee, unspecified: Secondary | ICD-10-CM

## 2020-05-31 DIAGNOSIS — Z720 Tobacco use: Secondary | ICD-10-CM

## 2020-05-31 HISTORY — DX: Abdominal aortic aneurysm, without rupture: I71.4

## 2020-05-31 HISTORY — DX: Abdominal aortic aneurysm, without rupture, unspecified: I71.40

## 2020-05-31 HISTORY — DX: Encounter for screening for malignant neoplasm of colon: Z12.11

## 2020-05-31 HISTORY — DX: Unilateral primary osteoarthritis, unspecified knee: M17.10

## 2020-05-31 HISTORY — DX: Tobacco use: Z72.0

## 2020-05-31 HISTORY — DX: Osteoarthritis of knee, unspecified: M17.9

## 2020-06-01 ENCOUNTER — Encounter: Payer: Self-pay | Admitting: Cardiology

## 2020-06-01 ENCOUNTER — Other Ambulatory Visit: Payer: Self-pay

## 2020-06-01 ENCOUNTER — Ambulatory Visit (INDEPENDENT_AMBULATORY_CARE_PROVIDER_SITE_OTHER): Payer: Medicare Other | Admitting: Cardiology

## 2020-06-01 VITALS — BP 120/70 | HR 62 | Ht 72.0 in | Wt 258.0 lb

## 2020-06-01 DIAGNOSIS — I480 Paroxysmal atrial fibrillation: Secondary | ICD-10-CM | POA: Diagnosis not present

## 2020-06-01 DIAGNOSIS — Z86711 Personal history of pulmonary embolism: Secondary | ICD-10-CM

## 2020-06-01 DIAGNOSIS — R0602 Shortness of breath: Secondary | ICD-10-CM | POA: Diagnosis not present

## 2020-06-01 DIAGNOSIS — G4733 Obstructive sleep apnea (adult) (pediatric): Secondary | ICD-10-CM

## 2020-06-01 DIAGNOSIS — Z86718 Personal history of other venous thrombosis and embolism: Secondary | ICD-10-CM | POA: Diagnosis not present

## 2020-06-01 NOTE — Progress Notes (Signed)
Cardiology Office Note:    Date:  06/01/2020   ID:  Ralph Powell, DOB [birth year], MRN [id number]  PCP:  Townsend Roger, MD  Cardiologist:  Jenean Lindau, MD   Referring MD: Townsend Roger, MD    ASSESSMENT:    1. Paroxysmal atrial fibrillation (HCC)   2. History of pulmonary embolus (PE)   3. History of DVT (deep vein thrombosis)   4. Obstructive sleep apnea   5. SOB (shortness of breath)    PLAN:    In order of problems listed above:  1. Primary prevention stressed with the patient.  Importance of compliance with diet medication stressed any vocalized understanding. 2. Heart failure with preserved ejection fraction: Stable at this time.  Patient is using diuretics appropriately.  He is also educated about salt intake and weighing himself regularly and he promises to do so. 3. COPD with home oxygen use all the time: Managed by his primary care physician.  Stable at this time. 4. Results of recent echocardiogram as mentioned below and was discussed with him.  Blood work report was discussed. 5. Patient will be seen in follow-up appointment in 6 months or earlier if the patient has any concerns 6. Paroxysmal atrial fibrillation:I discussed with the patient atrial fibrillation, disease process. Management and therapy including rate and rhythm control, anticoagulation benefits and potential risks were discussed extensively with the patient. Patient had multiple questions which were answered to patient's satisfaction. 7. Obesity: Weight reduction stressed.  Risks of obesity explained and he vocalized understanding.  He promises to keep [place] close monitor on his weight.   Medication Adjustments/Labs and Tests Ordered: Current medicines are reviewed at length with the patient today.  Concerns regarding medicines are outlined above.  No orders of the defined types were placed in this encounter.  No orders of the defined types were placed in this encounter.    Chief Complaint  Patient  presents with  . Follow-up     History of Present Illness:    Ralph Powell is a 75 y.o. male.  Patient has past medical history of advanced COPD, essential hypertension and obesity.  He denies any problems at this time.  He uses oxygen and leads a sedentary lifestyle.  His wife is very supportive and accompanies him for this visit.  At the time of my evaluation, the patient is alert awake oriented and in no distress.  Past Medical History:  Diagnosis Date  . Abdominal aortic aneurysm [facility]) 05/31/2020   Sep 19, 2012 Entered By: [place] Comment: see U/S report 09/18/2012; repeat in 1 year  . Abnormal liver function tests 04/26/2014  . Acute cor pulmonale (HCC)   . Acute deep vein thrombosis (DVT) of both lower extremities ([place]) 09/22/2016  . Acute encephalopathy 07/16/2018  . Acute hyponatremia 09/21/2016  . Acute on chronic diastolic (congestive) heart failure ([place])   . Acute on chronic respiratory failure with hypoxia and hypercapnia ([place]) 07/19/2018  . Acute pulmonary embolism ([place]) 09/23/2016  . Acute renal failure ([place])   . AKI (acute kidney injury) ([place]) 07/15/2018  . Alcohol abuse 04/20/2013  . Alcohol withdrawal delirium, acute, hyperactive ([place]) 09/24/2016  . Anemia 09/21/2016  . Arthritis   . Bilateral leg edema 09/21/2016  . Chronic obstructive lung disease ([place]) 04/20/2013  . COPD (chronic obstructive pulmonary disease) ([place])   . DVT, lower extremity ([place])   . Gout   . Heart failure ([place]) 07/15/2018  . History of DVT (deep vein thrombosis) 06/09/2018  .  History of pulmonary embolism 06/09/2018  . History of pulmonary embolus (PE)   . History of renal cell cancer 09/28/2016  . Hyperlipidemia   . Hypertension   . Obesity   . Obstructive sleep apnea   . OSA (obstructive sleep apnea) 10/03/2016  . Osteoarthritis of knee 05/31/2020   Jun 22, 2012 Entered By: Sharmaine Base A Comment: both knees  . Other and unspecified hyperlipidemia   . Other appendicitis 10/01/2016  . Other specified  disorders of kidney and ureter 03/11/2013  . Paroxysmal atrial fibrillation ([place]) 06/09/2018  . Renal carcinoma ([place])   . Renal cell cancer ([place]) 04/20/2013  . SOB (shortness of breath) 09/21/2016  . Special screening for malignant neoplasms, colon 05/31/2020   Dec 29, 2012 Entered By: Sharmaine Base A Comment: repeat 2017 routine  . SVT (supraventricular tachycardia) ([place]) 09/25/2016  . Tobacco user 05/31/2020    Past Surgical History:  Procedure Laterality Date  . NEPHRECTOMY Right   . RIGHT HEART CATH N/A 07/21/2018   Procedure: RIGHT HEART CATH;  Surgeon: Jolaine Artist, MD;  Location: [facility];  Service: Cardiovascular;  Laterality: N/A;    Current Medications: Current Meds  Medication Sig  . acetaminophen (TYLENOL) 325 MG tablet Take 650 mg by mouth daily as needed for mild pain.   Marland Kitchen amLODipine (NORVASC) 10 MG tablet Take 5 mg by mouth daily.  Marland Kitchen aspirin 81 MG EC tablet Take 81 mg by mouth daily.  . bethanechol (URECHOLINE) 50 MG tablet Take 50 mg by mouth 2 (two) times daily.  . Ferrous Sulfate (IRON) 325 (65 Fe) MG TABS Take 1 tablet by mouth daily.  Marland Kitchen gabapentin (NEURONTIN) 100 MG capsule Take 100 mg by mouth 2 (two) times daily.  Marland Kitchen gabapentin (NEURONTIN) 300 MG capsule Take 300 mg by mouth at bedtime.  Marland Kitchen lisinopril (ZESTRIL) 20 MG tablet Take 10 mg by mouth daily.  . Misc Natural Products (TART CHERRY ADVANCED PO) Take 500 mg by mouth 3 (three) times daily.   Marland Kitchen PACERONE 100 MG tablet Take 1 tablet by mouth once daily  . pantoprazole (PROTONIX) 40 MG tablet Take 40 mg by mouth daily.  . rivaroxaban (XARELTO) 20 MG TABS tablet Take 20 mg by mouth daily with supper.   . sennosides-docusate sodium (SENOKOT-S) 8.6-50 MG tablet Take 1 tablet by mouth daily.  . tamsulosin (FLOMAX) 0.4 MG CAPS capsule Take 0.4 mg by mouth daily.   Marland Kitchen torsemide (DEMADEX) 20 MG tablet Take 40 mg by mouth 2 (two) times daily.     Allergies:   Oxycodone and Codeine   Social History    Socioeconomic History  . Marital status: Married    Spouse name: Not on file  . Number of children: Not on file  . Years of education: Not on file  . Highest education level: Not on file  Occupational History  . Not on file  Tobacco Use  . Smoking status: Former Research scientist (life sciences)  . Smokeless tobacco: Never Used  Substance and Sexual Activity  . Alcohol use: Not Currently  . Drug use: Not on file  . Sexual activity: Not on file  Other Topics Concern  . Not on file  Social History Narrative  . Not on file   Social Determinants of Health   Financial Resource Strain: Not on file  Food Insecurity: Not on file  Transportation Needs: Not on file  Physical Activity: Not on file  Stress: Not on file  Social Connections: Not on file  Family History: The patient's family history includes Throat cancer in his mother; Valvular heart disease in his father.  ROS:   Please see the history of present illness.    All other systems reviewed and are negative.  EKGs/Labs/Other Studies Reviewed:    The following studies were reviewed today: IMPRESSIONS    1. Left ventricular ejection fraction, by estimation, is 60 to 65%. The  left ventricle has normal function. The left ventricle has no regional  wall motion abnormalities. Left ventricular diastolic parameters are  consistent with Grade I diastolic  dysfunction (impaired relaxation).    Recent Labs: 04/14/2020: BNP 125.6 05/04/2020: BUN 30; Creatinine, Ser 1.77; Potassium 4.4; Sodium 137  Recent Lipid Panel No results found for: CHOL, TRIG, HDL, CHOLHDL, VLDL, LDLCALC, LDLDIRECT  Physical Exam:    VS:  BP 120/70 (BP Location: Left Arm, Patient Position: Sitting, Cuff Size: Large)   Pulse 62   Ht 6' (1.829 m)   Wt 258 lb (117 kg)   SpO2 96%   BMI 34.99 kg/m     Wt Readings from Last 3 Encounters:  06/01/20 258 lb (117 kg)  05/04/20 257 lb (116.6 kg)  04/14/20 277 lb (125.6 kg)     GEN: Patient is in no acute  distress HEENT: Normal NECK: No JVD; No carotid bruits LYMPHATICS: No lymphadenopathy CARDIAC: Hear sounds regular, 2/6 systolic murmur at the apex. RESPIRATORY:  Clear to auscultation without rales, wheezing or rhonchi  ABDOMEN: Soft, non-tender, non-distended MUSCULOSKELETAL:  No edema; No deformity  SKIN: Warm and dry NEUROLOGIC:  Alert and oriented x 3 PSYCHIATRIC:  Normal affect   Signed, Jenean Lindau, MD  06/01/2020 4:05 PM    [facility]

## 2020-06-01 NOTE — Patient Instructions (Signed)
Medication Instructions:  No medication changes. *If you need a refill on your cardiac medications before your next appointment, please call your pharmacy*   Lab Work: None ordered If you have labs (blood work) drawn today and your tests are completely normal, you will receive your results only by: Marland Kitchen MyChart Message (if you have MyChart) OR . A paper copy in the mail If you have any lab test that is abnormal or we need to change your treatment, we will call you to review the results.   Testing/Procedures: None ordered   Follow-Up: At [facility], you and your health needs are our priority.  As part of our continuing mission to provide you with exceptional heart care, we have created designated Provider Care Teams.  These Care Teams include your primary Cardiologist (physician) and Advanced Practice Providers (APPs -  Physician Assistants and Nurse Practitioners) who all work together to provide you with the care you need, when you need it.  We recommend signing up for the patient portal called "MyChart".  Sign up information is provided on this After Visit Summary.  MyChart is used to connect with patients for Virtual Visits (Telemedicine).  Patients are able to view lab/test results, encounter notes, upcoming appointments, etc.  Non-urgent messages can be sent to your provider as well.   To learn more about what you can do with MyChart, go to [email or web address].    Your next appointment:   3 month(s)  The format for your next appointment:   In Person  Provider:   Jyl Heinz, MD   Other Instructions NA

## 2020-06-27 DIAGNOSIS — R339 Retention of urine, unspecified: Secondary | ICD-10-CM | POA: Diagnosis not present

## 2020-06-27 DIAGNOSIS — N401 Enlarged prostate with lower urinary tract symptoms: Secondary | ICD-10-CM | POA: Diagnosis not present

## 2020-07-13 ENCOUNTER — Telehealth: Payer: Self-pay | Admitting: Cardiology

## 2020-07-13 NOTE — Telephone Encounter (Signed)
Pt's family states [facility] filled his metalzone but pt is no longer on the medication. Advised that he is no longer on that medication and it is not on his list here at the office. Family verbalized understanding and had no additional questions.

## 2020-07-13 NOTE — Telephone Encounter (Signed)
Pt c/o medication issue:  1. Name of Medication: metolazone 2.5 mg  2. How are you currently taking this medication (dosage and times per day)? Has not started   3. Are you having a reaction (difficulty breathing--STAT)? no  4. What is your medication issue? Patient's wife states the patient received the medication, but it is not on his list of medication and they do not know what it is for.

## 2020-08-04 DIAGNOSIS — M109 Gout, unspecified: Secondary | ICD-10-CM | POA: Diagnosis not present

## 2020-08-04 DIAGNOSIS — Z125 Encounter for screening for malignant neoplasm of prostate: Secondary | ICD-10-CM | POA: Diagnosis not present

## 2020-08-04 DIAGNOSIS — Z1339 Encounter for screening examination for other mental health and behavioral disorders: Secondary | ICD-10-CM | POA: Diagnosis not present

## 2020-08-04 DIAGNOSIS — G629 Polyneuropathy, unspecified: Secondary | ICD-10-CM | POA: Diagnosis not present

## 2020-08-04 DIAGNOSIS — J449 Chronic obstructive pulmonary disease, unspecified: Secondary | ICD-10-CM | POA: Diagnosis not present

## 2020-08-04 DIAGNOSIS — I4891 Unspecified atrial fibrillation: Secondary | ICD-10-CM | POA: Diagnosis not present

## 2020-08-04 DIAGNOSIS — Z0000 Encounter for general adult medical examination without abnormal findings: Secondary | ICD-10-CM | POA: Diagnosis not present

## 2020-08-04 DIAGNOSIS — E669 Obesity, unspecified: Secondary | ICD-10-CM | POA: Diagnosis not present

## 2020-08-04 DIAGNOSIS — I10 Essential (primary) hypertension: Secondary | ICD-10-CM | POA: Diagnosis not present

## 2020-08-04 DIAGNOSIS — Z1331 Encounter for screening for depression: Secondary | ICD-10-CM | POA: Diagnosis not present

## 2020-08-04 DIAGNOSIS — E038 Other specified hypothyroidism: Secondary | ICD-10-CM | POA: Diagnosis not present

## 2020-08-04 DIAGNOSIS — J9610 Chronic respiratory failure, unspecified whether with hypoxia or hypercapnia: Secondary | ICD-10-CM | POA: Diagnosis not present

## 2020-08-04 DIAGNOSIS — I872 Venous insufficiency (chronic) (peripheral): Secondary | ICD-10-CM | POA: Diagnosis not present

## 2020-08-04 DIAGNOSIS — Z6834 Body mass index (BMI) 34.0-34.9, adult: Secondary | ICD-10-CM | POA: Diagnosis not present

## 2020-08-04 DIAGNOSIS — N1832 Chronic kidney disease, stage 3b: Secondary | ICD-10-CM | POA: Diagnosis not present

## 2020-08-04 DIAGNOSIS — N400 Enlarged prostate without lower urinary tract symptoms: Secondary | ICD-10-CM | POA: Diagnosis not present

## 2020-08-18 DIAGNOSIS — Z789 Other specified health status: Secondary | ICD-10-CM | POA: Diagnosis not present

## 2020-08-18 DIAGNOSIS — J342 Deviated nasal septum: Secondary | ICD-10-CM | POA: Diagnosis not present

## 2020-08-18 DIAGNOSIS — I4891 Unspecified atrial fibrillation: Secondary | ICD-10-CM | POA: Diagnosis not present

## 2020-08-18 DIAGNOSIS — Z7901 Long term (current) use of anticoagulants: Secondary | ICD-10-CM | POA: Diagnosis not present

## 2020-08-18 DIAGNOSIS — H9190 Unspecified hearing loss, unspecified ear: Secondary | ICD-10-CM | POA: Diagnosis not present

## 2020-08-18 DIAGNOSIS — Z87898 Personal history of other specified conditions: Secondary | ICD-10-CM | POA: Diagnosis not present

## 2020-08-18 DIAGNOSIS — J449 Chronic obstructive pulmonary disease, unspecified: Secondary | ICD-10-CM | POA: Diagnosis not present

## 2020-08-18 DIAGNOSIS — J3489 Other specified disorders of nose and nasal sinuses: Secondary | ICD-10-CM | POA: Diagnosis not present

## 2020-08-18 DIAGNOSIS — J310 Chronic rhinitis: Secondary | ICD-10-CM | POA: Diagnosis not present

## 2020-08-29 DIAGNOSIS — I10 Essential (primary) hypertension: Secondary | ICD-10-CM | POA: Diagnosis not present

## 2020-08-29 DIAGNOSIS — E785 Hyperlipidemia, unspecified: Secondary | ICD-10-CM | POA: Diagnosis not present

## 2020-09-01 ENCOUNTER — Other Ambulatory Visit: Payer: Self-pay

## 2020-09-01 DIAGNOSIS — J449 Chronic obstructive pulmonary disease, unspecified: Secondary | ICD-10-CM | POA: Insufficient documentation

## 2020-09-01 DIAGNOSIS — E785 Hyperlipidemia, unspecified: Secondary | ICD-10-CM | POA: Insufficient documentation

## 2020-09-06 ENCOUNTER — Encounter: Payer: Self-pay | Admitting: Cardiology

## 2020-09-06 ENCOUNTER — Ambulatory Visit (INDEPENDENT_AMBULATORY_CARE_PROVIDER_SITE_OTHER): Payer: Medicare Other | Admitting: Cardiology

## 2020-09-06 ENCOUNTER — Other Ambulatory Visit: Payer: Self-pay

## 2020-09-06 VITALS — BP 128/80 | HR 88 | Ht 72.0 in | Wt 250.2 lb

## 2020-09-06 DIAGNOSIS — E782 Mixed hyperlipidemia: Secondary | ICD-10-CM | POA: Diagnosis not present

## 2020-09-06 DIAGNOSIS — I480 Paroxysmal atrial fibrillation: Secondary | ICD-10-CM

## 2020-09-06 DIAGNOSIS — Z86711 Personal history of pulmonary embolism: Secondary | ICD-10-CM | POA: Diagnosis not present

## 2020-09-06 MED ORDER — AMLODIPINE BESYLATE 5 MG PO TABS: 1.0000 | ORAL_TABLET | Freq: Every day | ORAL | 3 refills | Status: AC

## 2020-09-06 NOTE — Addendum Note (Signed)
Addended by: Truddie Hidden on: 09/06/2020 02:41 PM   Modules accepted: Orders

## 2020-09-06 NOTE — Progress Notes (Signed)
Cardiology Office Note:    Date:  09/06/2020   ID:  Ralph Powell, DOB 09/03/1945, MRN [id number]  PCP:  Townsend Roger, MD  Cardiologist:  Jenean Lindau, MD   Referring MD: Townsend Roger, MD    ASSESSMENT:    1. Paroxysmal atrial fibrillation (HCC)   2. Mixed hyperlipidemia   3. History of pulmonary embolus (PE)    PLAN:    In order of problems listed above:  1. Primary prevention stressed with the patient.  Importance of compliance with diet medication stressed any vocalized understanding. 2. Paroxysmal atrial fibrillation:I discussed with the patient atrial fibrillation, disease process. Management and therapy including rate and rhythm control, anticoagulation benefits and potential risks were discussed extensively with the patient. Patient had multiple questions which were answered to patient's satisfaction. 3. Essential hypertension: Blood pressure stable and diet was emphasized.  He is going to get [place] the information about the correct dose of amlodipine.  He will call us when he goes home and tell us what dose he is taking 4. COPD on oxygen: Managed by primary care. 5. Sleep apnea: Sleep health issues were discussed and he understands. 6. Patient will be seen in follow-up appointment in 6 months or earlier if the patient has any concerns    Medication Adjustments/Labs and Tests Ordered: Current medicines are reviewed at length with the patient today.  Concerns regarding medicines are outlined above.  Orders Placed This Encounter  Procedures  . EKG 12-Lead   No orders of the defined types were placed in this encounter.    No chief complaint on file.    History of Present Illness:    Ralph Powell is a 75 y.o. male.  Patient has past medical history of paroxysmal atrial fibrillation, essential hypertension, COPD on oxygen and obesity.  He leads a sedentary lifestyle.  He denies any chest pain orthopnea or PND.  At the time of my evaluation, the patient is alert awake  oriented and in no distress.  His wife accompanies him for this visit and is very supportive.  Past Medical History:  Diagnosis Date  . Abdominal aortic aneurysm [facility]) 05/31/2020   Sep 19, 2012 Entered By: [place] Comment: see U/S report 09/18/2012; repeat in 1 year  . Abnormal liver function tests 04/26/2014  . Acute cor pulmonale (HCC)   . Acute deep vein thrombosis (DVT) of both lower extremities ([place]) 09/22/2016  . Acute encephalopathy 07/16/2018  . Acute hyponatremia 09/21/2016  . Acute on chronic diastolic (congestive) heart failure ([place])   . Acute on chronic respiratory failure with hypoxia and hypercapnia ([place]) 07/19/2018  . Acute pulmonary embolism ([place]) 09/23/2016  . Acute renal failure ([place])   . AKI (acute kidney injury) ([place]) 07/15/2018  . Alcohol abuse 04/20/2013  . Alcohol withdrawal delirium, acute, hyperactive ([place]) 09/24/2016  . Anemia 09/21/2016  . Arthritis   . Bilateral leg edema 09/21/2016  . Chronic obstructive lung disease ([place]) 04/20/2013  . COPD (chronic obstructive pulmonary disease) ([place])   . DVT, lower extremity ([place])   . Gout   . Heart failure ([place]) 07/15/2018  . History of DVT (deep vein thrombosis) 06/09/2018  . History of pulmonary embolism 06/09/2018  . History of pulmonary embolus (PE)   . History of renal cell cancer 09/28/2016  . Hyperlipidemia   . Hypertension   . Obesity   . Obstructive sleep apnea   . OSA (obstructive sleep apnea) 10/03/2016  . Osteoarthritis of knee 05/31/2020   Jun 22, 2012  Entered By: Sharmaine Base A Comment: both knees  . Other and unspecified hyperlipidemia   . Other appendicitis 10/01/2016  . Other specified disorders of kidney and ureter 03/11/2013  . Paroxysmal atrial fibrillation ([place]) 06/09/2018  . Renal carcinoma ([place])   . Renal cell cancer ([place]) 04/20/2013  . SOB (shortness of breath) 09/21/2016  . Special screening for malignant neoplasms, colon 05/31/2020   Dec 29, 2012 Entered By: Sharmaine Base A Comment: repeat 2017 routine   . SVT (supraventricular tachycardia) ([place]) 09/25/2016  . Tobacco user 05/31/2020    Past Surgical History:  Procedure Laterality Date  . NEPHRECTOMY Right   . RIGHT HEART CATH N/A 07/21/2018   Procedure: RIGHT HEART CATH;  Surgeon: Jolaine Artist, MD;  Location: [facility];  Service: Cardiovascular;  Laterality: N/A;    Current Medications: Current Meds  Medication Sig  . vitamin B-12 (CYANOCOBALAMIN) 500 MCG tablet Take 500 mcg by mouth 2 (two) times daily.     Allergies:   Oxycodone and Codeine   Social History   Socioeconomic History  . Marital status: Married    Spouse name: Not on file  . Number of children: Not on file  . Years of education: Not on file  . Highest education level: Not on file  Occupational History  . Not on file  Tobacco Use  . Smoking status: Former Research scientist (life sciences)  . Smokeless tobacco: Never Used  Substance and Sexual Activity  . Alcohol use: Not Currently  . Drug use: Not on file  . Sexual activity: Not on file  Other Topics Concern  . Not on file  Social History Narrative  . Not on file   Social Determinants of Health   Financial Resource Strain: Not on file  Food Insecurity: Not on file  Transportation Needs: Not on file  Physical Activity: Not on file  Stress: Not on file  Social Connections: Not on file     Family History: The patient's family history includes Throat cancer in his mother; Valvular heart disease in his father.  ROS:   Please see the history of present illness.    All other systems reviewed and are negative.  EKGs/Labs/Other Studies Reviewed:    The following studies were reviewed today:  IMPRESSIONS    1. Left ventricular ejection fraction, by estimation, is 60 to 65%. The  left ventricle has normal function. The left ventricle has no regional  wall motion abnormalities. Left ventricular diastolic parameters are  consistent with Grade I diastolic  dysfunction (impaired relaxation).    Recent  Labs: 04/14/2020: BNP 125.6 05/04/2020: BUN 30; Creatinine, Ser 1.77; Potassium 4.4; Sodium 137  Recent Lipid Panel No results found for: CHOL, TRIG, HDL, CHOLHDL, VLDL, LDLCALC, LDLDIRECT  Physical Exam:    VS:  BP 128/80   Pulse 88   Ht 6' (1.829 m)   Wt 250 lb 3.2 oz (113.5 kg)   SpO2 96%   BMI 33.93 kg/m     Wt Readings from Last 3 Encounters:  09/06/20 250 lb 3.2 oz (113.5 kg)  06/01/20 258 lb (117 kg)  05/04/20 257 lb (116.6 kg)     GEN: Patient is in no acute distress HEENT: Normal NECK: No JVD; No carotid bruits LYMPHATICS: No lymphadenopathy CARDIAC: Hear sounds regular, 2/6 systolic murmur at the apex. RESPIRATORY:  Clear to auscultation without rales, wheezing or rhonchi  ABDOMEN: Soft, non-tender, non-distended MUSCULOSKELETAL:  No edema; No deformity  SKIN: Warm and dry NEUROLOGIC:  Alert and oriented x 3 PSYCHIATRIC:  Normal affect   Signed, Jenean Lindau, MD  09/06/2020 1:59 PM    [facility]

## 2020-09-06 NOTE — Patient Instructions (Signed)
Call [phone] with dosage of Amlodipine.  Medication Instructions:  No medication changes. *If you need a refill on your cardiac medications before your next appointment, please call your pharmacy*   Lab Work: None ordered If you have labs (blood work) drawn today and your tests are completely normal, you will receive your results only by: Marland Kitchen MyChart Message (if you have MyChart) OR . A paper copy in the mail If you have any lab test that is abnormal or we need to change your treatment, we will call you to review the results.   Testing/Procedures: None ordered   Follow-Up: At [facility], you and your health needs are our priority.  As part of our continuing mission to provide you with exceptional heart care, we have created designated Provider Care Teams.  These Care Teams include your primary Cardiologist (physician) and Advanced Practice Providers (APPs -  Physician Assistants and Nurse Practitioners) who all work together to provide you with the care you need, when you need it.  We recommend signing up for the patient portal called "MyChart".  Sign up information is provided on this After Visit Summary.  MyChart is used to connect with patients for Virtual Visits (Telemedicine).  Patients are able to view lab/test results, encounter notes, upcoming appointments, etc.  Non-urgent messages can be sent to your provider as well.   To learn more about what you can do with MyChart, go to [email or web address].    Your next appointment:   6 month(s)  The format for your next appointment:   In Person  Provider:   Jyl Heinz, MD   Other Instructions NA

## 2020-09-27 DIAGNOSIS — N401 Enlarged prostate with lower urinary tract symptoms: Secondary | ICD-10-CM | POA: Diagnosis not present

## 2020-09-27 DIAGNOSIS — R339 Retention of urine, unspecified: Secondary | ICD-10-CM | POA: Diagnosis not present

## 2020-11-06 ENCOUNTER — Other Ambulatory Visit: Payer: Self-pay | Admitting: Cardiology

## 2020-11-06 DIAGNOSIS — I10 Essential (primary) hypertension: Secondary | ICD-10-CM | POA: Diagnosis not present

## 2020-11-16 DIAGNOSIS — I1310 Hypertensive heart and chronic kidney disease without heart failure, with stage 1 through stage 4 chronic kidney disease, or unspecified chronic kidney disease: Secondary | ICD-10-CM | POA: Diagnosis not present

## 2020-11-16 DIAGNOSIS — N1832 Chronic kidney disease, stage 3b: Secondary | ICD-10-CM | POA: Diagnosis not present

## 2020-11-16 DIAGNOSIS — R801 Persistent proteinuria, unspecified: Secondary | ICD-10-CM | POA: Diagnosis not present

## 2020-11-16 DIAGNOSIS — N400 Enlarged prostate without lower urinary tract symptoms: Secondary | ICD-10-CM | POA: Diagnosis not present

## 2020-11-16 DIAGNOSIS — Z905 Acquired absence of kidney: Secondary | ICD-10-CM | POA: Diagnosis not present

## 2021-01-03 DIAGNOSIS — Z20822 Contact with and (suspected) exposure to covid-19: Secondary | ICD-10-CM | POA: Diagnosis not present

## 2021-01-29 DIAGNOSIS — J449 Chronic obstructive pulmonary disease, unspecified: Secondary | ICD-10-CM | POA: Diagnosis not present

## 2021-01-29 DIAGNOSIS — N1832 Chronic kidney disease, stage 3b: Secondary | ICD-10-CM | POA: Diagnosis not present

## 2021-01-30 DIAGNOSIS — Z125 Encounter for screening for malignant neoplasm of prostate: Secondary | ICD-10-CM | POA: Diagnosis not present

## 2021-01-30 DIAGNOSIS — R339 Retention of urine, unspecified: Secondary | ICD-10-CM | POA: Diagnosis not present

## 2021-01-30 DIAGNOSIS — N401 Enlarged prostate with lower urinary tract symptoms: Secondary | ICD-10-CM | POA: Diagnosis not present

## 2021-02-09 DIAGNOSIS — I10 Essential (primary) hypertension: Secondary | ICD-10-CM | POA: Diagnosis not present

## 2021-02-09 DIAGNOSIS — Z23 Encounter for immunization: Secondary | ICD-10-CM | POA: Diagnosis not present

## 2021-02-27 DIAGNOSIS — Z7901 Long term (current) use of anticoagulants: Secondary | ICD-10-CM | POA: Diagnosis not present

## 2021-02-27 DIAGNOSIS — I491 Atrial premature depolarization: Secondary | ICD-10-CM | POA: Diagnosis not present

## 2021-02-27 DIAGNOSIS — R0789 Other chest pain: Secondary | ICD-10-CM | POA: Diagnosis not present

## 2021-02-27 DIAGNOSIS — J9811 Atelectasis: Secondary | ICD-10-CM | POA: Diagnosis not present

## 2021-02-27 DIAGNOSIS — I4519 Other right bundle-branch block: Secondary | ICD-10-CM | POA: Diagnosis not present

## 2021-02-27 DIAGNOSIS — I4510 Unspecified right bundle-branch block: Secondary | ICD-10-CM | POA: Diagnosis not present

## 2021-02-27 DIAGNOSIS — I959 Hypotension, unspecified: Secondary | ICD-10-CM | POA: Diagnosis not present

## 2021-02-27 DIAGNOSIS — I517 Cardiomegaly: Secondary | ICD-10-CM | POA: Diagnosis not present

## 2021-02-27 DIAGNOSIS — I493 Ventricular premature depolarization: Secondary | ICD-10-CM | POA: Diagnosis not present

## 2021-02-27 DIAGNOSIS — Z20822 Contact with and (suspected) exposure to covid-19: Secondary | ICD-10-CM | POA: Diagnosis not present

## 2021-02-27 DIAGNOSIS — R079 Chest pain, unspecified: Secondary | ICD-10-CM | POA: Diagnosis not present

## 2021-02-27 DIAGNOSIS — R509 Fever, unspecified: Secondary | ICD-10-CM | POA: Diagnosis not present

## 2021-02-27 DIAGNOSIS — R55 Syncope and collapse: Secondary | ICD-10-CM | POA: Diagnosis not present

## 2021-02-27 DIAGNOSIS — R059 Cough, unspecified: Secondary | ICD-10-CM | POA: Diagnosis not present

## 2021-02-27 DIAGNOSIS — Z86711 Personal history of pulmonary embolism: Secondary | ICD-10-CM | POA: Diagnosis not present

## 2021-02-27 DIAGNOSIS — R0602 Shortness of breath: Secondary | ICD-10-CM | POA: Diagnosis not present

## 2021-02-27 DIAGNOSIS — Z87448 Personal history of other diseases of urinary system: Secondary | ICD-10-CM | POA: Diagnosis not present

## 2021-02-27 DIAGNOSIS — J189 Pneumonia, unspecified organism: Secondary | ICD-10-CM | POA: Diagnosis not present

## 2021-02-28 DIAGNOSIS — E1169 Type 2 diabetes mellitus with other specified complication: Secondary | ICD-10-CM | POA: Diagnosis not present

## 2021-02-28 DIAGNOSIS — I4891 Unspecified atrial fibrillation: Secondary | ICD-10-CM | POA: Diagnosis not present

## 2021-02-28 DIAGNOSIS — I10 Essential (primary) hypertension: Secondary | ICD-10-CM | POA: Diagnosis not present

## 2021-03-01 DIAGNOSIS — R0609 Other forms of dyspnea: Secondary | ICD-10-CM | POA: Diagnosis not present

## 2021-03-05 DIAGNOSIS — R0902 Hypoxemia: Secondary | ICD-10-CM | POA: Diagnosis not present

## 2021-03-05 DIAGNOSIS — R0603 Acute respiratory distress: Secondary | ICD-10-CM | POA: Diagnosis not present

## 2021-03-05 DIAGNOSIS — J90 Pleural effusion, not elsewhere classified: Secondary | ICD-10-CM | POA: Diagnosis not present

## 2021-03-05 DIAGNOSIS — I110 Hypertensive heart disease with heart failure: Secondary | ICD-10-CM | POA: Diagnosis not present

## 2021-03-05 DIAGNOSIS — K8020 Calculus of gallbladder without cholecystitis without obstruction: Secondary | ICD-10-CM | POA: Diagnosis not present

## 2021-03-05 DIAGNOSIS — R0689 Other abnormalities of breathing: Secondary | ICD-10-CM | POA: Diagnosis not present

## 2021-03-05 DIAGNOSIS — N179 Acute kidney failure, unspecified: Secondary | ICD-10-CM | POA: Diagnosis not present

## 2021-03-05 DIAGNOSIS — K5790 Diverticulosis of intestine, part unspecified, without perforation or abscess without bleeding: Secondary | ICD-10-CM | POA: Diagnosis not present

## 2021-03-05 DIAGNOSIS — R6521 Severe sepsis with septic shock: Secondary | ICD-10-CM | POA: Diagnosis not present

## 2021-03-05 DIAGNOSIS — R4182 Altered mental status, unspecified: Secondary | ICD-10-CM | POA: Diagnosis not present

## 2021-03-05 DIAGNOSIS — K7689 Other specified diseases of liver: Secondary | ICD-10-CM | POA: Diagnosis not present

## 2021-03-05 DIAGNOSIS — R404 Transient alteration of awareness: Secondary | ICD-10-CM | POA: Diagnosis not present

## 2021-03-05 DIAGNOSIS — A419 Sepsis, unspecified organism: Secondary | ICD-10-CM | POA: Diagnosis not present

## 2021-03-05 DIAGNOSIS — I517 Cardiomegaly: Secondary | ICD-10-CM | POA: Diagnosis not present

## 2021-03-05 DIAGNOSIS — J80 Acute respiratory distress syndrome: Secondary | ICD-10-CM | POA: Diagnosis not present

## 2021-03-05 DIAGNOSIS — I5043 Acute on chronic combined systolic (congestive) and diastolic (congestive) heart failure: Secondary | ICD-10-CM | POA: Diagnosis not present

## 2021-03-05 DIAGNOSIS — J9811 Atelectasis: Secondary | ICD-10-CM | POA: Diagnosis not present

## 2021-03-05 DIAGNOSIS — K529 Noninfective gastroenteritis and colitis, unspecified: Secondary | ICD-10-CM | POA: Diagnosis not present

## 2021-03-05 DIAGNOSIS — J9601 Acute respiratory failure with hypoxia: Secondary | ICD-10-CM | POA: Diagnosis not present

## 2021-03-05 DIAGNOSIS — G9341 Metabolic encephalopathy: Secondary | ICD-10-CM | POA: Diagnosis not present

## 2021-03-06 DIAGNOSIS — I361 Nonrheumatic tricuspid (valve) insufficiency: Secondary | ICD-10-CM | POA: Diagnosis not present

## 2021-03-06 DIAGNOSIS — J189 Pneumonia, unspecified organism: Secondary | ICD-10-CM | POA: Diagnosis not present

## 2021-03-06 DIAGNOSIS — K8000 Calculus of gallbladder with acute cholecystitis without obstruction: Secondary | ICD-10-CM | POA: Diagnosis not present

## 2021-03-06 DIAGNOSIS — Z9911 Dependence on respirator [ventilator] status: Secondary | ICD-10-CM | POA: Diagnosis not present

## 2021-03-06 DIAGNOSIS — R079 Chest pain, unspecified: Secondary | ICD-10-CM | POA: Diagnosis not present

## 2021-03-06 DIAGNOSIS — R0600 Dyspnea, unspecified: Secondary | ICD-10-CM | POA: Diagnosis not present

## 2021-03-06 DIAGNOSIS — G9341 Metabolic encephalopathy: Secondary | ICD-10-CM | POA: Diagnosis not present

## 2021-03-06 DIAGNOSIS — J90 Pleural effusion, not elsewhere classified: Secondary | ICD-10-CM | POA: Diagnosis not present

## 2021-03-06 DIAGNOSIS — J449 Chronic obstructive pulmonary disease, unspecified: Secondary | ICD-10-CM | POA: Diagnosis not present

## 2021-03-06 DIAGNOSIS — I5043 Acute on chronic combined systolic (congestive) and diastolic (congestive) heart failure: Secondary | ICD-10-CM | POA: Diagnosis not present

## 2021-03-06 DIAGNOSIS — J9620 Acute and chronic respiratory failure, unspecified whether with hypoxia or hypercapnia: Secondary | ICD-10-CM | POA: Diagnosis not present

## 2021-03-06 DIAGNOSIS — A419 Sepsis, unspecified organism: Secondary | ICD-10-CM | POA: Diagnosis not present

## 2021-03-07 DIAGNOSIS — J189 Pneumonia, unspecified organism: Secondary | ICD-10-CM | POA: Diagnosis not present

## 2021-03-07 DIAGNOSIS — J9690 Respiratory failure, unspecified, unspecified whether with hypoxia or hypercapnia: Secondary | ICD-10-CM | POA: Diagnosis not present

## 2021-03-07 DIAGNOSIS — J9620 Acute and chronic respiratory failure, unspecified whether with hypoxia or hypercapnia: Secondary | ICD-10-CM | POA: Diagnosis not present

## 2021-03-07 DIAGNOSIS — K8000 Calculus of gallbladder with acute cholecystitis without obstruction: Secondary | ICD-10-CM | POA: Diagnosis not present

## 2021-03-07 DIAGNOSIS — J449 Chronic obstructive pulmonary disease, unspecified: Secondary | ICD-10-CM | POA: Diagnosis not present

## 2021-03-07 DIAGNOSIS — G9341 Metabolic encephalopathy: Secondary | ICD-10-CM | POA: Diagnosis not present

## 2021-03-07 DIAGNOSIS — A419 Sepsis, unspecified organism: Secondary | ICD-10-CM | POA: Diagnosis not present

## 2021-03-07 DIAGNOSIS — I5043 Acute on chronic combined systolic (congestive) and diastolic (congestive) heart failure: Secondary | ICD-10-CM | POA: Diagnosis not present

## 2021-03-07 DIAGNOSIS — Z9911 Dependence on respirator [ventilator] status: Secondary | ICD-10-CM | POA: Diagnosis not present

## 2021-03-07 DIAGNOSIS — J9811 Atelectasis: Secondary | ICD-10-CM | POA: Diagnosis not present

## 2021-03-07 DIAGNOSIS — I517 Cardiomegaly: Secondary | ICD-10-CM | POA: Diagnosis not present

## 2021-03-07 DIAGNOSIS — J90 Pleural effusion, not elsewhere classified: Secondary | ICD-10-CM | POA: Diagnosis not present

## 2021-03-08 DIAGNOSIS — A419 Sepsis, unspecified organism: Secondary | ICD-10-CM | POA: Diagnosis not present

## 2021-03-08 DIAGNOSIS — R918 Other nonspecific abnormal finding of lung field: Secondary | ICD-10-CM | POA: Diagnosis not present

## 2021-03-08 DIAGNOSIS — K8000 Calculus of gallbladder with acute cholecystitis without obstruction: Secondary | ICD-10-CM | POA: Diagnosis not present

## 2021-03-08 DIAGNOSIS — G9341 Metabolic encephalopathy: Secondary | ICD-10-CM | POA: Diagnosis not present

## 2021-03-08 DIAGNOSIS — I5043 Acute on chronic combined systolic (congestive) and diastolic (congestive) heart failure: Secondary | ICD-10-CM | POA: Diagnosis not present

## 2021-03-08 DIAGNOSIS — Z4682 Encounter for fitting and adjustment of non-vascular catheter: Secondary | ICD-10-CM | POA: Diagnosis not present

## 2021-03-09 DIAGNOSIS — I517 Cardiomegaly: Secondary | ICD-10-CM | POA: Diagnosis not present

## 2021-03-09 DIAGNOSIS — K8020 Calculus of gallbladder without cholecystitis without obstruction: Secondary | ICD-10-CM | POA: Diagnosis not present

## 2021-03-09 DIAGNOSIS — I5043 Acute on chronic combined systolic (congestive) and diastolic (congestive) heart failure: Secondary | ICD-10-CM | POA: Diagnosis not present

## 2021-03-09 DIAGNOSIS — G9341 Metabolic encephalopathy: Secondary | ICD-10-CM | POA: Diagnosis not present

## 2021-03-09 DIAGNOSIS — R0600 Dyspnea, unspecified: Secondary | ICD-10-CM | POA: Diagnosis not present

## 2021-03-09 DIAGNOSIS — R079 Chest pain, unspecified: Secondary | ICD-10-CM | POA: Diagnosis not present

## 2021-03-09 DIAGNOSIS — A419 Sepsis, unspecified organism: Secondary | ICD-10-CM | POA: Diagnosis not present

## 2021-03-10 DIAGNOSIS — J189 Pneumonia, unspecified organism: Secondary | ICD-10-CM | POA: Diagnosis not present

## 2021-03-10 DIAGNOSIS — A419 Sepsis, unspecified organism: Secondary | ICD-10-CM | POA: Diagnosis not present

## 2021-03-10 DIAGNOSIS — I5043 Acute on chronic combined systolic (congestive) and diastolic (congestive) heart failure: Secondary | ICD-10-CM | POA: Diagnosis not present

## 2021-03-10 DIAGNOSIS — G9341 Metabolic encephalopathy: Secondary | ICD-10-CM | POA: Diagnosis not present

## 2021-03-10 DIAGNOSIS — Z743 Need for continuous supervision: Secondary | ICD-10-CM | POA: Diagnosis not present

## 2021-03-10 DIAGNOSIS — J9601 Acute respiratory failure with hypoxia: Secondary | ICD-10-CM | POA: Diagnosis not present

## 2021-03-10 DIAGNOSIS — R1312 Dysphagia, oropharyngeal phase: Secondary | ICD-10-CM | POA: Diagnosis not present

## 2021-03-10 DIAGNOSIS — J918 Pleural effusion in other conditions classified elsewhere: Secondary | ICD-10-CM | POA: Diagnosis not present

## 2021-03-10 DIAGNOSIS — R4182 Altered mental status, unspecified: Secondary | ICD-10-CM | POA: Diagnosis not present

## 2021-03-10 DIAGNOSIS — J9621 Acute and chronic respiratory failure with hypoxia: Secondary | ICD-10-CM | POA: Diagnosis not present

## 2021-03-11 DIAGNOSIS — J9620 Acute and chronic respiratory failure, unspecified whether with hypoxia or hypercapnia: Secondary | ICD-10-CM | POA: Diagnosis not present

## 2021-03-11 DIAGNOSIS — J449 Chronic obstructive pulmonary disease, unspecified: Secondary | ICD-10-CM | POA: Diagnosis not present

## 2021-03-11 DIAGNOSIS — R1312 Dysphagia, oropharyngeal phase: Secondary | ICD-10-CM | POA: Diagnosis not present

## 2021-03-11 DIAGNOSIS — J918 Pleural effusion in other conditions classified elsewhere: Secondary | ICD-10-CM | POA: Diagnosis not present

## 2021-03-11 DIAGNOSIS — J189 Pneumonia, unspecified organism: Secondary | ICD-10-CM | POA: Diagnosis not present

## 2021-03-11 DIAGNOSIS — J9621 Acute and chronic respiratory failure with hypoxia: Secondary | ICD-10-CM | POA: Diagnosis not present

## 2021-03-11 DIAGNOSIS — J9601 Acute respiratory failure with hypoxia: Secondary | ICD-10-CM | POA: Diagnosis not present

## 2021-03-11 DIAGNOSIS — Z9911 Dependence on respirator [ventilator] status: Secondary | ICD-10-CM | POA: Diagnosis not present

## 2021-03-12 DIAGNOSIS — I700 Atherosclerosis of aorta: Secondary | ICD-10-CM | POA: Diagnosis not present

## 2021-03-12 DIAGNOSIS — J9811 Atelectasis: Secondary | ICD-10-CM | POA: Diagnosis not present

## 2021-03-12 DIAGNOSIS — Z9911 Dependence on respirator [ventilator] status: Secondary | ICD-10-CM | POA: Diagnosis not present

## 2021-03-12 DIAGNOSIS — R001 Bradycardia, unspecified: Secondary | ICD-10-CM | POA: Diagnosis not present

## 2021-03-12 DIAGNOSIS — R0789 Other chest pain: Secondary | ICD-10-CM | POA: Diagnosis not present

## 2021-03-12 DIAGNOSIS — J90 Pleural effusion, not elsewhere classified: Secondary | ICD-10-CM | POA: Diagnosis not present

## 2021-03-12 DIAGNOSIS — I130 Hypertensive heart and chronic kidney disease with heart failure and stage 1 through stage 4 chronic kidney disease, or unspecified chronic kidney disease: Secondary | ICD-10-CM | POA: Diagnosis not present

## 2021-03-12 DIAGNOSIS — J449 Chronic obstructive pulmonary disease, unspecified: Secondary | ICD-10-CM | POA: Diagnosis not present

## 2021-03-12 DIAGNOSIS — I509 Heart failure, unspecified: Secondary | ICD-10-CM | POA: Diagnosis not present

## 2021-03-12 DIAGNOSIS — G9341 Metabolic encephalopathy: Secondary | ICD-10-CM | POA: Diagnosis not present

## 2021-03-12 DIAGNOSIS — R109 Unspecified abdominal pain: Secondary | ICD-10-CM | POA: Diagnosis not present

## 2021-03-12 DIAGNOSIS — A419 Sepsis, unspecified organism: Secondary | ICD-10-CM | POA: Diagnosis not present

## 2021-03-12 DIAGNOSIS — J9621 Acute and chronic respiratory failure with hypoxia: Secondary | ICD-10-CM | POA: Diagnosis not present

## 2021-03-12 DIAGNOSIS — J9620 Acute and chronic respiratory failure, unspecified whether with hypoxia or hypercapnia: Secondary | ICD-10-CM | POA: Diagnosis not present

## 2021-03-12 DIAGNOSIS — J189 Pneumonia, unspecified organism: Secondary | ICD-10-CM | POA: Diagnosis not present

## 2021-03-12 DIAGNOSIS — D649 Anemia, unspecified: Secondary | ICD-10-CM | POA: Diagnosis not present

## 2021-03-12 DIAGNOSIS — N2581 Secondary hyperparathyroidism of renal origin: Secondary | ICD-10-CM | POA: Diagnosis not present

## 2021-03-12 DIAGNOSIS — E875 Hyperkalemia: Secondary | ICD-10-CM | POA: Diagnosis not present

## 2021-03-12 DIAGNOSIS — N183 Chronic kidney disease, stage 3 unspecified: Secondary | ICD-10-CM | POA: Diagnosis not present

## 2021-03-12 DIAGNOSIS — R188 Other ascites: Secondary | ICD-10-CM | POA: Diagnosis not present

## 2021-03-12 DIAGNOSIS — N179 Acute kidney failure, unspecified: Secondary | ICD-10-CM | POA: Diagnosis not present

## 2021-03-13 DIAGNOSIS — Z9911 Dependence on respirator [ventilator] status: Secondary | ICD-10-CM | POA: Diagnosis not present

## 2021-03-13 DIAGNOSIS — R5381 Other malaise: Secondary | ICD-10-CM | POA: Diagnosis not present

## 2021-03-13 DIAGNOSIS — N179 Acute kidney failure, unspecified: Secondary | ICD-10-CM | POA: Diagnosis not present

## 2021-03-13 DIAGNOSIS — E875 Hyperkalemia: Secondary | ICD-10-CM | POA: Diagnosis not present

## 2021-03-13 DIAGNOSIS — E049 Nontoxic goiter, unspecified: Secondary | ICD-10-CM | POA: Diagnosis not present

## 2021-03-13 DIAGNOSIS — N2581 Secondary hyperparathyroidism of renal origin: Secondary | ICD-10-CM | POA: Diagnosis not present

## 2021-03-13 DIAGNOSIS — J9620 Acute and chronic respiratory failure, unspecified whether with hypoxia or hypercapnia: Secondary | ICD-10-CM | POA: Diagnosis not present

## 2021-03-13 DIAGNOSIS — I130 Hypertensive heart and chronic kidney disease with heart failure and stage 1 through stage 4 chronic kidney disease, or unspecified chronic kidney disease: Secondary | ICD-10-CM | POA: Diagnosis not present

## 2021-03-13 DIAGNOSIS — R160 Hepatomegaly, not elsewhere classified: Secondary | ICD-10-CM | POA: Diagnosis not present

## 2021-03-13 DIAGNOSIS — D649 Anemia, unspecified: Secondary | ICD-10-CM | POA: Diagnosis not present

## 2021-03-13 DIAGNOSIS — R1311 Dysphagia, oral phase: Secondary | ICD-10-CM | POA: Diagnosis not present

## 2021-03-13 DIAGNOSIS — R109 Unspecified abdominal pain: Secondary | ICD-10-CM | POA: Diagnosis not present

## 2021-03-13 DIAGNOSIS — R2689 Other abnormalities of gait and mobility: Secondary | ICD-10-CM | POA: Diagnosis not present

## 2021-03-13 DIAGNOSIS — J449 Chronic obstructive pulmonary disease, unspecified: Secondary | ICD-10-CM | POA: Diagnosis not present

## 2021-03-13 DIAGNOSIS — I509 Heart failure, unspecified: Secondary | ICD-10-CM | POA: Diagnosis not present

## 2021-03-13 DIAGNOSIS — R188 Other ascites: Secondary | ICD-10-CM | POA: Diagnosis not present

## 2021-03-13 DIAGNOSIS — J9611 Chronic respiratory failure with hypoxia: Secondary | ICD-10-CM | POA: Diagnosis not present

## 2021-03-13 DIAGNOSIS — J189 Pneumonia, unspecified organism: Secondary | ICD-10-CM | POA: Diagnosis not present

## 2021-03-13 DIAGNOSIS — G9341 Metabolic encephalopathy: Secondary | ICD-10-CM | POA: Diagnosis not present

## 2021-03-13 DIAGNOSIS — J9621 Acute and chronic respiratory failure with hypoxia: Secondary | ICD-10-CM | POA: Diagnosis not present

## 2021-03-13 DIAGNOSIS — R4189 Other symptoms and signs involving cognitive functions and awareness: Secondary | ICD-10-CM | POA: Diagnosis not present

## 2021-03-13 DIAGNOSIS — M6281 Muscle weakness (generalized): Secondary | ICD-10-CM | POA: Diagnosis not present

## 2021-03-13 DIAGNOSIS — N183 Chronic kidney disease, stage 3 unspecified: Secondary | ICD-10-CM | POA: Diagnosis not present

## 2021-03-13 DIAGNOSIS — A419 Sepsis, unspecified organism: Secondary | ICD-10-CM | POA: Diagnosis not present

## 2021-03-13 DIAGNOSIS — R001 Bradycardia, unspecified: Secondary | ICD-10-CM | POA: Diagnosis not present

## 2021-03-14 DIAGNOSIS — N2581 Secondary hyperparathyroidism of renal origin: Secondary | ICD-10-CM | POA: Diagnosis not present

## 2021-03-14 DIAGNOSIS — E875 Hyperkalemia: Secondary | ICD-10-CM | POA: Diagnosis not present

## 2021-03-14 DIAGNOSIS — N179 Acute kidney failure, unspecified: Secondary | ICD-10-CM | POA: Diagnosis not present

## 2021-03-14 DIAGNOSIS — J189 Pneumonia, unspecified organism: Secondary | ICD-10-CM | POA: Diagnosis not present

## 2021-03-14 DIAGNOSIS — G9341 Metabolic encephalopathy: Secondary | ICD-10-CM | POA: Diagnosis not present

## 2021-03-14 DIAGNOSIS — J9621 Acute and chronic respiratory failure with hypoxia: Secondary | ICD-10-CM | POA: Diagnosis not present

## 2021-03-14 DIAGNOSIS — J9620 Acute and chronic respiratory failure, unspecified whether with hypoxia or hypercapnia: Secondary | ICD-10-CM | POA: Diagnosis not present

## 2021-03-14 DIAGNOSIS — I130 Hypertensive heart and chronic kidney disease with heart failure and stage 1 through stage 4 chronic kidney disease, or unspecified chronic kidney disease: Secondary | ICD-10-CM | POA: Diagnosis not present

## 2021-03-14 DIAGNOSIS — I509 Heart failure, unspecified: Secondary | ICD-10-CM | POA: Diagnosis not present

## 2021-03-14 DIAGNOSIS — J449 Chronic obstructive pulmonary disease, unspecified: Secondary | ICD-10-CM | POA: Diagnosis not present

## 2021-03-14 DIAGNOSIS — R001 Bradycardia, unspecified: Secondary | ICD-10-CM | POA: Diagnosis not present

## 2021-03-14 DIAGNOSIS — A419 Sepsis, unspecified organism: Secondary | ICD-10-CM | POA: Diagnosis not present

## 2021-03-14 DIAGNOSIS — D649 Anemia, unspecified: Secondary | ICD-10-CM | POA: Diagnosis not present

## 2021-03-14 DIAGNOSIS — Z9911 Dependence on respirator [ventilator] status: Secondary | ICD-10-CM | POA: Diagnosis not present

## 2021-03-14 DIAGNOSIS — N183 Chronic kidney disease, stage 3 unspecified: Secondary | ICD-10-CM | POA: Diagnosis not present

## 2021-03-15 DIAGNOSIS — J449 Chronic obstructive pulmonary disease, unspecified: Secondary | ICD-10-CM | POA: Diagnosis not present

## 2021-03-15 DIAGNOSIS — R4189 Other symptoms and signs involving cognitive functions and awareness: Secondary | ICD-10-CM | POA: Diagnosis not present

## 2021-03-15 DIAGNOSIS — M6281 Muscle weakness (generalized): Secondary | ICD-10-CM | POA: Diagnosis not present

## 2021-03-15 DIAGNOSIS — G9341 Metabolic encephalopathy: Secondary | ICD-10-CM | POA: Diagnosis not present

## 2021-03-15 DIAGNOSIS — N183 Chronic kidney disease, stage 3 unspecified: Secondary | ICD-10-CM | POA: Diagnosis not present

## 2021-03-15 DIAGNOSIS — J9611 Chronic respiratory failure with hypoxia: Secondary | ICD-10-CM | POA: Diagnosis not present

## 2021-03-15 DIAGNOSIS — E875 Hyperkalemia: Secondary | ICD-10-CM | POA: Diagnosis not present

## 2021-03-15 DIAGNOSIS — J9621 Acute and chronic respiratory failure with hypoxia: Secondary | ICD-10-CM | POA: Diagnosis not present

## 2021-03-15 DIAGNOSIS — I509 Heart failure, unspecified: Secondary | ICD-10-CM | POA: Diagnosis not present

## 2021-03-15 DIAGNOSIS — N2581 Secondary hyperparathyroidism of renal origin: Secondary | ICD-10-CM | POA: Diagnosis not present

## 2021-03-15 DIAGNOSIS — R188 Other ascites: Secondary | ICD-10-CM | POA: Diagnosis not present

## 2021-03-15 DIAGNOSIS — Z9911 Dependence on respirator [ventilator] status: Secondary | ICD-10-CM | POA: Diagnosis not present

## 2021-03-15 DIAGNOSIS — R2689 Other abnormalities of gait and mobility: Secondary | ICD-10-CM | POA: Diagnosis not present

## 2021-03-15 DIAGNOSIS — R1311 Dysphagia, oral phase: Secondary | ICD-10-CM | POA: Diagnosis not present

## 2021-03-15 DIAGNOSIS — R001 Bradycardia, unspecified: Secondary | ICD-10-CM | POA: Diagnosis not present

## 2021-03-15 DIAGNOSIS — R5381 Other malaise: Secondary | ICD-10-CM | POA: Diagnosis not present

## 2021-03-15 DIAGNOSIS — R1310 Dysphagia, unspecified: Secondary | ICD-10-CM | POA: Diagnosis not present

## 2021-03-15 DIAGNOSIS — J9620 Acute and chronic respiratory failure, unspecified whether with hypoxia or hypercapnia: Secondary | ICD-10-CM | POA: Diagnosis not present

## 2021-03-15 DIAGNOSIS — J189 Pneumonia, unspecified organism: Secondary | ICD-10-CM | POA: Diagnosis not present

## 2021-03-15 DIAGNOSIS — N179 Acute kidney failure, unspecified: Secondary | ICD-10-CM | POA: Diagnosis not present

## 2021-03-15 DIAGNOSIS — I130 Hypertensive heart and chronic kidney disease with heart failure and stage 1 through stage 4 chronic kidney disease, or unspecified chronic kidney disease: Secondary | ICD-10-CM | POA: Diagnosis not present

## 2021-03-15 DIAGNOSIS — E049 Nontoxic goiter, unspecified: Secondary | ICD-10-CM | POA: Diagnosis not present

## 2021-03-15 DIAGNOSIS — J9600 Acute respiratory failure, unspecified whether with hypoxia or hypercapnia: Secondary | ICD-10-CM | POA: Diagnosis not present

## 2021-03-15 DIAGNOSIS — A419 Sepsis, unspecified organism: Secondary | ICD-10-CM | POA: Diagnosis not present

## 2021-03-15 DIAGNOSIS — D649 Anemia, unspecified: Secondary | ICD-10-CM | POA: Diagnosis not present

## 2021-03-16 DIAGNOSIS — A419 Sepsis, unspecified organism: Secondary | ICD-10-CM | POA: Diagnosis not present

## 2021-03-16 DIAGNOSIS — J449 Chronic obstructive pulmonary disease, unspecified: Secondary | ICD-10-CM | POA: Diagnosis not present

## 2021-03-16 DIAGNOSIS — N183 Chronic kidney disease, stage 3 unspecified: Secondary | ICD-10-CM | POA: Diagnosis not present

## 2021-03-16 DIAGNOSIS — J9621 Acute and chronic respiratory failure with hypoxia: Secondary | ICD-10-CM | POA: Diagnosis not present

## 2021-03-16 DIAGNOSIS — N179 Acute kidney failure, unspecified: Secondary | ICD-10-CM | POA: Diagnosis not present

## 2021-03-16 DIAGNOSIS — R1311 Dysphagia, oral phase: Secondary | ICD-10-CM | POA: Diagnosis not present

## 2021-03-16 DIAGNOSIS — R188 Other ascites: Secondary | ICD-10-CM | POA: Diagnosis not present

## 2021-03-16 DIAGNOSIS — N2581 Secondary hyperparathyroidism of renal origin: Secondary | ICD-10-CM | POA: Diagnosis not present

## 2021-03-16 DIAGNOSIS — R2689 Other abnormalities of gait and mobility: Secondary | ICD-10-CM | POA: Diagnosis not present

## 2021-03-16 DIAGNOSIS — D649 Anemia, unspecified: Secondary | ICD-10-CM | POA: Diagnosis not present

## 2021-03-16 DIAGNOSIS — R5381 Other malaise: Secondary | ICD-10-CM | POA: Diagnosis not present

## 2021-03-16 DIAGNOSIS — I130 Hypertensive heart and chronic kidney disease with heart failure and stage 1 through stage 4 chronic kidney disease, or unspecified chronic kidney disease: Secondary | ICD-10-CM | POA: Diagnosis not present

## 2021-03-16 DIAGNOSIS — M6281 Muscle weakness (generalized): Secondary | ICD-10-CM | POA: Diagnosis not present

## 2021-03-16 DIAGNOSIS — J9611 Chronic respiratory failure with hypoxia: Secondary | ICD-10-CM | POA: Diagnosis not present

## 2021-03-16 DIAGNOSIS — G9341 Metabolic encephalopathy: Secondary | ICD-10-CM | POA: Diagnosis not present

## 2021-03-16 DIAGNOSIS — Z9911 Dependence on respirator [ventilator] status: Secondary | ICD-10-CM | POA: Diagnosis not present

## 2021-03-16 DIAGNOSIS — E049 Nontoxic goiter, unspecified: Secondary | ICD-10-CM | POA: Diagnosis not present

## 2021-03-16 DIAGNOSIS — R4189 Other symptoms and signs involving cognitive functions and awareness: Secondary | ICD-10-CM | POA: Diagnosis not present

## 2021-03-16 DIAGNOSIS — I509 Heart failure, unspecified: Secondary | ICD-10-CM | POA: Diagnosis not present

## 2021-03-16 DIAGNOSIS — R001 Bradycardia, unspecified: Secondary | ICD-10-CM | POA: Diagnosis not present

## 2021-03-16 DIAGNOSIS — J189 Pneumonia, unspecified organism: Secondary | ICD-10-CM | POA: Diagnosis not present

## 2021-03-16 DIAGNOSIS — J9620 Acute and chronic respiratory failure, unspecified whether with hypoxia or hypercapnia: Secondary | ICD-10-CM | POA: Diagnosis not present

## 2021-03-16 DIAGNOSIS — E875 Hyperkalemia: Secondary | ICD-10-CM | POA: Diagnosis not present

## 2021-03-17 DIAGNOSIS — J441 Chronic obstructive pulmonary disease with (acute) exacerbation: Secondary | ICD-10-CM | POA: Diagnosis not present

## 2021-03-17 DIAGNOSIS — J189 Pneumonia, unspecified organism: Secondary | ICD-10-CM | POA: Diagnosis not present

## 2021-03-17 DIAGNOSIS — N179 Acute kidney failure, unspecified: Secondary | ICD-10-CM | POA: Diagnosis not present

## 2021-03-17 DIAGNOSIS — K700 Alcoholic fatty liver: Secondary | ICD-10-CM | POA: Diagnosis not present

## 2021-03-17 DIAGNOSIS — J9621 Acute and chronic respiratory failure with hypoxia: Secondary | ICD-10-CM | POA: Diagnosis not present

## 2021-03-18 DIAGNOSIS — N1832 Chronic kidney disease, stage 3b: Secondary | ICD-10-CM | POA: Diagnosis not present

## 2021-03-18 DIAGNOSIS — N179 Acute kidney failure, unspecified: Secondary | ICD-10-CM | POA: Diagnosis not present

## 2021-03-18 DIAGNOSIS — J189 Pneumonia, unspecified organism: Secondary | ICD-10-CM | POA: Diagnosis not present

## 2021-03-18 DIAGNOSIS — J449 Chronic obstructive pulmonary disease, unspecified: Secondary | ICD-10-CM | POA: Diagnosis not present

## 2021-03-18 DIAGNOSIS — K700 Alcoholic fatty liver: Secondary | ICD-10-CM | POA: Diagnosis not present

## 2021-03-18 DIAGNOSIS — J441 Chronic obstructive pulmonary disease with (acute) exacerbation: Secondary | ICD-10-CM | POA: Diagnosis not present

## 2021-03-18 DIAGNOSIS — J9621 Acute and chronic respiratory failure with hypoxia: Secondary | ICD-10-CM | POA: Diagnosis not present

## 2021-03-19 DIAGNOSIS — Z9911 Dependence on respirator [ventilator] status: Secondary | ICD-10-CM | POA: Diagnosis not present

## 2021-03-19 DIAGNOSIS — J441 Chronic obstructive pulmonary disease with (acute) exacerbation: Secondary | ICD-10-CM | POA: Diagnosis not present

## 2021-03-19 DIAGNOSIS — I130 Hypertensive heart and chronic kidney disease with heart failure and stage 1 through stage 4 chronic kidney disease, or unspecified chronic kidney disease: Secondary | ICD-10-CM | POA: Diagnosis not present

## 2021-03-19 DIAGNOSIS — I4891 Unspecified atrial fibrillation: Secondary | ICD-10-CM | POA: Diagnosis not present

## 2021-03-19 DIAGNOSIS — E875 Hyperkalemia: Secondary | ICD-10-CM | POA: Diagnosis not present

## 2021-03-19 DIAGNOSIS — R1310 Dysphagia, unspecified: Secondary | ICD-10-CM | POA: Diagnosis not present

## 2021-03-19 DIAGNOSIS — J449 Chronic obstructive pulmonary disease, unspecified: Secondary | ICD-10-CM | POA: Diagnosis not present

## 2021-03-19 DIAGNOSIS — D72829 Elevated white blood cell count, unspecified: Secondary | ICD-10-CM | POA: Diagnosis not present

## 2021-03-19 DIAGNOSIS — I5032 Chronic diastolic (congestive) heart failure: Secondary | ICD-10-CM | POA: Diagnosis not present

## 2021-03-19 DIAGNOSIS — J189 Pneumonia, unspecified organism: Secondary | ICD-10-CM | POA: Diagnosis not present

## 2021-03-19 DIAGNOSIS — R509 Fever, unspecified: Secondary | ICD-10-CM | POA: Diagnosis not present

## 2021-03-19 DIAGNOSIS — D649 Anemia, unspecified: Secondary | ICD-10-CM | POA: Diagnosis not present

## 2021-03-19 DIAGNOSIS — A419 Sepsis, unspecified organism: Secondary | ICD-10-CM | POA: Diagnosis not present

## 2021-03-19 DIAGNOSIS — N2581 Secondary hyperparathyroidism of renal origin: Secondary | ICD-10-CM | POA: Diagnosis not present

## 2021-03-19 DIAGNOSIS — N1832 Chronic kidney disease, stage 3b: Secondary | ICD-10-CM | POA: Diagnosis not present

## 2021-03-19 DIAGNOSIS — N179 Acute kidney failure, unspecified: Secondary | ICD-10-CM | POA: Diagnosis not present

## 2021-03-19 DIAGNOSIS — R6520 Severe sepsis without septic shock: Secondary | ICD-10-CM | POA: Diagnosis not present

## 2021-03-19 DIAGNOSIS — I509 Heart failure, unspecified: Secondary | ICD-10-CM | POA: Diagnosis not present

## 2021-03-19 DIAGNOSIS — N183 Chronic kidney disease, stage 3 unspecified: Secondary | ICD-10-CM | POA: Diagnosis not present

## 2021-03-19 DIAGNOSIS — J918 Pleural effusion in other conditions classified elsewhere: Secondary | ICD-10-CM | POA: Diagnosis not present

## 2021-03-19 DIAGNOSIS — J9621 Acute and chronic respiratory failure with hypoxia: Secondary | ICD-10-CM | POA: Diagnosis not present

## 2021-03-19 DIAGNOSIS — K700 Alcoholic fatty liver: Secondary | ICD-10-CM | POA: Diagnosis not present

## 2021-03-20 DIAGNOSIS — N2581 Secondary hyperparathyroidism of renal origin: Secondary | ICD-10-CM | POA: Diagnosis not present

## 2021-03-20 DIAGNOSIS — J449 Chronic obstructive pulmonary disease, unspecified: Secondary | ICD-10-CM | POA: Diagnosis not present

## 2021-03-20 DIAGNOSIS — D649 Anemia, unspecified: Secondary | ICD-10-CM | POA: Diagnosis not present

## 2021-03-20 DIAGNOSIS — I130 Hypertensive heart and chronic kidney disease with heart failure and stage 1 through stage 4 chronic kidney disease, or unspecified chronic kidney disease: Secondary | ICD-10-CM | POA: Diagnosis not present

## 2021-03-20 DIAGNOSIS — M6281 Muscle weakness (generalized): Secondary | ICD-10-CM | POA: Diagnosis not present

## 2021-03-20 DIAGNOSIS — J189 Pneumonia, unspecified organism: Secondary | ICD-10-CM | POA: Diagnosis not present

## 2021-03-20 DIAGNOSIS — E875 Hyperkalemia: Secondary | ICD-10-CM | POA: Diagnosis not present

## 2021-03-20 DIAGNOSIS — R5381 Other malaise: Secondary | ICD-10-CM | POA: Diagnosis not present

## 2021-03-20 DIAGNOSIS — K700 Alcoholic fatty liver: Secondary | ICD-10-CM | POA: Diagnosis not present

## 2021-03-20 DIAGNOSIS — N1832 Chronic kidney disease, stage 3b: Secondary | ICD-10-CM | POA: Diagnosis not present

## 2021-03-20 DIAGNOSIS — A419 Sepsis, unspecified organism: Secondary | ICD-10-CM | POA: Diagnosis not present

## 2021-03-20 DIAGNOSIS — R2689 Other abnormalities of gait and mobility: Secondary | ICD-10-CM | POA: Diagnosis not present

## 2021-03-20 DIAGNOSIS — N183 Chronic kidney disease, stage 3 unspecified: Secondary | ICD-10-CM | POA: Diagnosis not present

## 2021-03-20 DIAGNOSIS — J9621 Acute and chronic respiratory failure with hypoxia: Secondary | ICD-10-CM | POA: Diagnosis not present

## 2021-03-20 DIAGNOSIS — I509 Heart failure, unspecified: Secondary | ICD-10-CM | POA: Diagnosis not present

## 2021-03-20 DIAGNOSIS — N179 Acute kidney failure, unspecified: Secondary | ICD-10-CM | POA: Diagnosis not present

## 2021-03-20 DIAGNOSIS — J441 Chronic obstructive pulmonary disease with (acute) exacerbation: Secondary | ICD-10-CM | POA: Diagnosis not present

## 2021-03-20 DIAGNOSIS — R4189 Other symptoms and signs involving cognitive functions and awareness: Secondary | ICD-10-CM | POA: Diagnosis not present

## 2021-03-21 DIAGNOSIS — K700 Alcoholic fatty liver: Secondary | ICD-10-CM | POA: Diagnosis not present

## 2021-03-21 DIAGNOSIS — A419 Sepsis, unspecified organism: Secondary | ICD-10-CM | POA: Diagnosis not present

## 2021-03-21 DIAGNOSIS — N179 Acute kidney failure, unspecified: Secondary | ICD-10-CM | POA: Diagnosis not present

## 2021-03-21 DIAGNOSIS — D649 Anemia, unspecified: Secondary | ICD-10-CM | POA: Diagnosis not present

## 2021-03-21 DIAGNOSIS — N183 Chronic kidney disease, stage 3 unspecified: Secondary | ICD-10-CM | POA: Diagnosis not present

## 2021-03-21 DIAGNOSIS — J441 Chronic obstructive pulmonary disease with (acute) exacerbation: Secondary | ICD-10-CM | POA: Diagnosis not present

## 2021-03-21 DIAGNOSIS — J9621 Acute and chronic respiratory failure with hypoxia: Secondary | ICD-10-CM | POA: Diagnosis not present

## 2021-03-21 DIAGNOSIS — J449 Chronic obstructive pulmonary disease, unspecified: Secondary | ICD-10-CM | POA: Diagnosis not present

## 2021-03-21 DIAGNOSIS — J189 Pneumonia, unspecified organism: Secondary | ICD-10-CM | POA: Diagnosis not present

## 2021-03-21 DIAGNOSIS — I509 Heart failure, unspecified: Secondary | ICD-10-CM | POA: Diagnosis not present

## 2021-03-21 DIAGNOSIS — N1832 Chronic kidney disease, stage 3b: Secondary | ICD-10-CM | POA: Diagnosis not present

## 2021-03-21 DIAGNOSIS — E875 Hyperkalemia: Secondary | ICD-10-CM | POA: Diagnosis not present

## 2021-03-21 DIAGNOSIS — I130 Hypertensive heart and chronic kidney disease with heart failure and stage 1 through stage 4 chronic kidney disease, or unspecified chronic kidney disease: Secondary | ICD-10-CM | POA: Diagnosis not present

## 2021-03-21 DIAGNOSIS — N2581 Secondary hyperparathyroidism of renal origin: Secondary | ICD-10-CM | POA: Diagnosis not present

## 2021-03-22 DIAGNOSIS — D649 Anemia, unspecified: Secondary | ICD-10-CM | POA: Diagnosis not present

## 2021-03-22 DIAGNOSIS — M6281 Muscle weakness (generalized): Secondary | ICD-10-CM | POA: Diagnosis not present

## 2021-03-22 DIAGNOSIS — I509 Heart failure, unspecified: Secondary | ICD-10-CM | POA: Diagnosis not present

## 2021-03-22 DIAGNOSIS — N183 Chronic kidney disease, stage 3 unspecified: Secondary | ICD-10-CM | POA: Diagnosis not present

## 2021-03-22 DIAGNOSIS — J189 Pneumonia, unspecified organism: Secondary | ICD-10-CM | POA: Diagnosis not present

## 2021-03-22 DIAGNOSIS — K700 Alcoholic fatty liver: Secondary | ICD-10-CM | POA: Diagnosis not present

## 2021-03-22 DIAGNOSIS — R4189 Other symptoms and signs involving cognitive functions and awareness: Secondary | ICD-10-CM | POA: Diagnosis not present

## 2021-03-22 DIAGNOSIS — J441 Chronic obstructive pulmonary disease with (acute) exacerbation: Secondary | ICD-10-CM | POA: Diagnosis not present

## 2021-03-22 DIAGNOSIS — R5381 Other malaise: Secondary | ICD-10-CM | POA: Diagnosis not present

## 2021-03-22 DIAGNOSIS — J449 Chronic obstructive pulmonary disease, unspecified: Secondary | ICD-10-CM | POA: Diagnosis not present

## 2021-03-22 DIAGNOSIS — R2689 Other abnormalities of gait and mobility: Secondary | ICD-10-CM | POA: Diagnosis not present

## 2021-03-22 DIAGNOSIS — I130 Hypertensive heart and chronic kidney disease with heart failure and stage 1 through stage 4 chronic kidney disease, or unspecified chronic kidney disease: Secondary | ICD-10-CM | POA: Diagnosis not present

## 2021-03-22 DIAGNOSIS — N2581 Secondary hyperparathyroidism of renal origin: Secondary | ICD-10-CM | POA: Diagnosis not present

## 2021-03-22 DIAGNOSIS — A419 Sepsis, unspecified organism: Secondary | ICD-10-CM | POA: Diagnosis not present

## 2021-03-22 DIAGNOSIS — N179 Acute kidney failure, unspecified: Secondary | ICD-10-CM | POA: Diagnosis not present

## 2021-03-22 DIAGNOSIS — J9621 Acute and chronic respiratory failure with hypoxia: Secondary | ICD-10-CM | POA: Diagnosis not present

## 2021-03-22 DIAGNOSIS — N1832 Chronic kidney disease, stage 3b: Secondary | ICD-10-CM | POA: Diagnosis not present

## 2021-03-22 DIAGNOSIS — E875 Hyperkalemia: Secondary | ICD-10-CM | POA: Diagnosis not present

## 2021-03-23 DIAGNOSIS — N179 Acute kidney failure, unspecified: Secondary | ICD-10-CM | POA: Diagnosis not present

## 2021-03-23 DIAGNOSIS — N183 Chronic kidney disease, stage 3 unspecified: Secondary | ICD-10-CM | POA: Diagnosis not present

## 2021-03-23 DIAGNOSIS — I130 Hypertensive heart and chronic kidney disease with heart failure and stage 1 through stage 4 chronic kidney disease, or unspecified chronic kidney disease: Secondary | ICD-10-CM | POA: Diagnosis not present

## 2021-03-23 DIAGNOSIS — N1832 Chronic kidney disease, stage 3b: Secondary | ICD-10-CM | POA: Diagnosis not present

## 2021-03-23 DIAGNOSIS — J189 Pneumonia, unspecified organism: Secondary | ICD-10-CM | POA: Diagnosis not present

## 2021-03-23 DIAGNOSIS — J9621 Acute and chronic respiratory failure with hypoxia: Secondary | ICD-10-CM | POA: Diagnosis not present

## 2021-03-23 DIAGNOSIS — I509 Heart failure, unspecified: Secondary | ICD-10-CM | POA: Diagnosis not present

## 2021-03-23 DIAGNOSIS — N2581 Secondary hyperparathyroidism of renal origin: Secondary | ICD-10-CM | POA: Diagnosis not present

## 2021-03-23 DIAGNOSIS — J441 Chronic obstructive pulmonary disease with (acute) exacerbation: Secondary | ICD-10-CM | POA: Diagnosis not present

## 2021-03-23 DIAGNOSIS — D649 Anemia, unspecified: Secondary | ICD-10-CM | POA: Diagnosis not present

## 2021-03-23 DIAGNOSIS — J449 Chronic obstructive pulmonary disease, unspecified: Secondary | ICD-10-CM | POA: Diagnosis not present

## 2021-03-23 DIAGNOSIS — K700 Alcoholic fatty liver: Secondary | ICD-10-CM | POA: Diagnosis not present

## 2021-03-23 DIAGNOSIS — A419 Sepsis, unspecified organism: Secondary | ICD-10-CM | POA: Diagnosis not present

## 2021-03-23 DIAGNOSIS — E875 Hyperkalemia: Secondary | ICD-10-CM | POA: Diagnosis not present

## 2021-03-24 DIAGNOSIS — N1832 Chronic kidney disease, stage 3b: Secondary | ICD-10-CM | POA: Diagnosis not present

## 2021-03-24 DIAGNOSIS — J189 Pneumonia, unspecified organism: Secondary | ICD-10-CM | POA: Diagnosis not present

## 2021-03-24 DIAGNOSIS — J441 Chronic obstructive pulmonary disease with (acute) exacerbation: Secondary | ICD-10-CM | POA: Diagnosis not present

## 2021-03-24 DIAGNOSIS — J449 Chronic obstructive pulmonary disease, unspecified: Secondary | ICD-10-CM | POA: Diagnosis not present

## 2021-03-24 DIAGNOSIS — K700 Alcoholic fatty liver: Secondary | ICD-10-CM | POA: Diagnosis not present

## 2021-03-24 DIAGNOSIS — N179 Acute kidney failure, unspecified: Secondary | ICD-10-CM | POA: Diagnosis not present

## 2021-03-24 DIAGNOSIS — J9621 Acute and chronic respiratory failure with hypoxia: Secondary | ICD-10-CM | POA: Diagnosis not present

## 2021-03-25 DIAGNOSIS — J9621 Acute and chronic respiratory failure with hypoxia: Secondary | ICD-10-CM | POA: Diagnosis not present

## 2021-03-25 DIAGNOSIS — K700 Alcoholic fatty liver: Secondary | ICD-10-CM | POA: Diagnosis not present

## 2021-03-25 DIAGNOSIS — J449 Chronic obstructive pulmonary disease, unspecified: Secondary | ICD-10-CM | POA: Diagnosis not present

## 2021-03-25 DIAGNOSIS — J441 Chronic obstructive pulmonary disease with (acute) exacerbation: Secondary | ICD-10-CM | POA: Diagnosis not present

## 2021-03-25 DIAGNOSIS — N179 Acute kidney failure, unspecified: Secondary | ICD-10-CM | POA: Diagnosis not present

## 2021-03-25 DIAGNOSIS — J189 Pneumonia, unspecified organism: Secondary | ICD-10-CM | POA: Diagnosis not present

## 2021-03-25 DIAGNOSIS — N1832 Chronic kidney disease, stage 3b: Secondary | ICD-10-CM | POA: Diagnosis not present

## 2021-03-26 DIAGNOSIS — N2581 Secondary hyperparathyroidism of renal origin: Secondary | ICD-10-CM | POA: Diagnosis not present

## 2021-03-26 DIAGNOSIS — I509 Heart failure, unspecified: Secondary | ICD-10-CM | POA: Diagnosis not present

## 2021-03-26 DIAGNOSIS — J9621 Acute and chronic respiratory failure with hypoxia: Secondary | ICD-10-CM | POA: Diagnosis not present

## 2021-03-26 DIAGNOSIS — D649 Anemia, unspecified: Secondary | ICD-10-CM | POA: Diagnosis not present

## 2021-03-26 DIAGNOSIS — J441 Chronic obstructive pulmonary disease with (acute) exacerbation: Secondary | ICD-10-CM | POA: Diagnosis not present

## 2021-03-26 DIAGNOSIS — Z9911 Dependence on respirator [ventilator] status: Secondary | ICD-10-CM | POA: Diagnosis not present

## 2021-03-26 DIAGNOSIS — J189 Pneumonia, unspecified organism: Secondary | ICD-10-CM | POA: Diagnosis not present

## 2021-03-26 DIAGNOSIS — K700 Alcoholic fatty liver: Secondary | ICD-10-CM | POA: Diagnosis not present

## 2021-03-26 DIAGNOSIS — J449 Chronic obstructive pulmonary disease, unspecified: Secondary | ICD-10-CM | POA: Diagnosis not present

## 2021-03-26 DIAGNOSIS — N183 Chronic kidney disease, stage 3 unspecified: Secondary | ICD-10-CM | POA: Diagnosis not present

## 2021-03-26 DIAGNOSIS — I130 Hypertensive heart and chronic kidney disease with heart failure and stage 1 through stage 4 chronic kidney disease, or unspecified chronic kidney disease: Secondary | ICD-10-CM | POA: Diagnosis not present

## 2021-03-26 DIAGNOSIS — A419 Sepsis, unspecified organism: Secondary | ICD-10-CM | POA: Diagnosis not present

## 2021-03-26 DIAGNOSIS — E875 Hyperkalemia: Secondary | ICD-10-CM | POA: Diagnosis not present

## 2021-03-26 DIAGNOSIS — N179 Acute kidney failure, unspecified: Secondary | ICD-10-CM | POA: Diagnosis not present

## 2021-03-27 DIAGNOSIS — I130 Hypertensive heart and chronic kidney disease with heart failure and stage 1 through stage 4 chronic kidney disease, or unspecified chronic kidney disease: Secondary | ICD-10-CM | POA: Diagnosis not present

## 2021-03-27 DIAGNOSIS — D649 Anemia, unspecified: Secondary | ICD-10-CM | POA: Diagnosis not present

## 2021-03-27 DIAGNOSIS — K700 Alcoholic fatty liver: Secondary | ICD-10-CM | POA: Diagnosis not present

## 2021-03-27 DIAGNOSIS — E875 Hyperkalemia: Secondary | ICD-10-CM | POA: Diagnosis not present

## 2021-03-27 DIAGNOSIS — J441 Chronic obstructive pulmonary disease with (acute) exacerbation: Secondary | ICD-10-CM | POA: Diagnosis not present

## 2021-03-27 DIAGNOSIS — N2581 Secondary hyperparathyroidism of renal origin: Secondary | ICD-10-CM | POA: Diagnosis not present

## 2021-03-27 DIAGNOSIS — J449 Chronic obstructive pulmonary disease, unspecified: Secondary | ICD-10-CM | POA: Diagnosis not present

## 2021-03-27 DIAGNOSIS — I509 Heart failure, unspecified: Secondary | ICD-10-CM | POA: Diagnosis not present

## 2021-03-27 DIAGNOSIS — N183 Chronic kidney disease, stage 3 unspecified: Secondary | ICD-10-CM | POA: Diagnosis not present

## 2021-03-27 DIAGNOSIS — J9621 Acute and chronic respiratory failure with hypoxia: Secondary | ICD-10-CM | POA: Diagnosis not present

## 2021-03-27 DIAGNOSIS — N179 Acute kidney failure, unspecified: Secondary | ICD-10-CM | POA: Diagnosis not present

## 2021-03-27 DIAGNOSIS — Z9911 Dependence on respirator [ventilator] status: Secondary | ICD-10-CM | POA: Diagnosis not present

## 2021-03-27 DIAGNOSIS — J189 Pneumonia, unspecified organism: Secondary | ICD-10-CM | POA: Diagnosis not present

## 2021-03-27 DIAGNOSIS — A419 Sepsis, unspecified organism: Secondary | ICD-10-CM | POA: Diagnosis not present

## 2021-03-28 DIAGNOSIS — N179 Acute kidney failure, unspecified: Secondary | ICD-10-CM | POA: Diagnosis not present

## 2021-03-28 DIAGNOSIS — K700 Alcoholic fatty liver: Secondary | ICD-10-CM | POA: Diagnosis not present

## 2021-03-28 DIAGNOSIS — J189 Pneumonia, unspecified organism: Secondary | ICD-10-CM | POA: Diagnosis not present

## 2021-03-28 DIAGNOSIS — J449 Chronic obstructive pulmonary disease, unspecified: Secondary | ICD-10-CM | POA: Diagnosis not present

## 2021-03-28 DIAGNOSIS — Z9911 Dependence on respirator [ventilator] status: Secondary | ICD-10-CM | POA: Diagnosis not present

## 2021-03-28 DIAGNOSIS — J441 Chronic obstructive pulmonary disease with (acute) exacerbation: Secondary | ICD-10-CM | POA: Diagnosis not present

## 2021-03-28 DIAGNOSIS — J9621 Acute and chronic respiratory failure with hypoxia: Secondary | ICD-10-CM | POA: Diagnosis not present

## 2021-03-29 DIAGNOSIS — J9621 Acute and chronic respiratory failure with hypoxia: Secondary | ICD-10-CM | POA: Diagnosis not present

## 2021-03-29 DIAGNOSIS — J189 Pneumonia, unspecified organism: Secondary | ICD-10-CM | POA: Diagnosis not present

## 2021-03-29 DIAGNOSIS — K700 Alcoholic fatty liver: Secondary | ICD-10-CM | POA: Diagnosis not present

## 2021-03-29 DIAGNOSIS — J449 Chronic obstructive pulmonary disease, unspecified: Secondary | ICD-10-CM | POA: Diagnosis not present

## 2021-03-29 DIAGNOSIS — J441 Chronic obstructive pulmonary disease with (acute) exacerbation: Secondary | ICD-10-CM | POA: Diagnosis not present

## 2021-03-29 DIAGNOSIS — Z9911 Dependence on respirator [ventilator] status: Secondary | ICD-10-CM | POA: Diagnosis not present

## 2021-03-29 DIAGNOSIS — N179 Acute kidney failure, unspecified: Secondary | ICD-10-CM | POA: Diagnosis not present

## 2021-03-30 DIAGNOSIS — J449 Chronic obstructive pulmonary disease, unspecified: Secondary | ICD-10-CM | POA: Diagnosis not present

## 2021-03-30 DIAGNOSIS — K700 Alcoholic fatty liver: Secondary | ICD-10-CM | POA: Diagnosis not present

## 2021-03-30 DIAGNOSIS — J441 Chronic obstructive pulmonary disease with (acute) exacerbation: Secondary | ICD-10-CM | POA: Diagnosis not present

## 2021-03-30 DIAGNOSIS — N179 Acute kidney failure, unspecified: Secondary | ICD-10-CM | POA: Diagnosis not present

## 2021-03-30 DIAGNOSIS — Z9911 Dependence on respirator [ventilator] status: Secondary | ICD-10-CM | POA: Diagnosis not present

## 2021-03-30 DIAGNOSIS — J189 Pneumonia, unspecified organism: Secondary | ICD-10-CM | POA: Diagnosis not present

## 2021-03-30 DIAGNOSIS — J9621 Acute and chronic respiratory failure with hypoxia: Secondary | ICD-10-CM | POA: Diagnosis not present

## 2021-03-31 DIAGNOSIS — J449 Chronic obstructive pulmonary disease, unspecified: Secondary | ICD-10-CM | POA: Diagnosis not present

## 2021-03-31 DIAGNOSIS — Z9911 Dependence on respirator [ventilator] status: Secondary | ICD-10-CM | POA: Diagnosis not present

## 2021-03-31 DIAGNOSIS — K700 Alcoholic fatty liver: Secondary | ICD-10-CM | POA: Diagnosis not present

## 2021-03-31 DIAGNOSIS — J9621 Acute and chronic respiratory failure with hypoxia: Secondary | ICD-10-CM | POA: Diagnosis not present

## 2021-03-31 DIAGNOSIS — N179 Acute kidney failure, unspecified: Secondary | ICD-10-CM | POA: Diagnosis not present

## 2021-03-31 DIAGNOSIS — J189 Pneumonia, unspecified organism: Secondary | ICD-10-CM | POA: Diagnosis not present

## 2021-03-31 DIAGNOSIS — J441 Chronic obstructive pulmonary disease with (acute) exacerbation: Secondary | ICD-10-CM | POA: Diagnosis not present

## 2021-04-01 DIAGNOSIS — N179 Acute kidney failure, unspecified: Secondary | ICD-10-CM | POA: Diagnosis not present

## 2021-04-01 DIAGNOSIS — J449 Chronic obstructive pulmonary disease, unspecified: Secondary | ICD-10-CM | POA: Diagnosis not present

## 2021-04-01 DIAGNOSIS — J441 Chronic obstructive pulmonary disease with (acute) exacerbation: Secondary | ICD-10-CM | POA: Diagnosis not present

## 2021-04-01 DIAGNOSIS — J189 Pneumonia, unspecified organism: Secondary | ICD-10-CM | POA: Diagnosis not present

## 2021-04-01 DIAGNOSIS — K700 Alcoholic fatty liver: Secondary | ICD-10-CM | POA: Diagnosis not present

## 2021-04-01 DIAGNOSIS — Z9911 Dependence on respirator [ventilator] status: Secondary | ICD-10-CM | POA: Diagnosis not present

## 2021-04-01 DIAGNOSIS — J9621 Acute and chronic respiratory failure with hypoxia: Secondary | ICD-10-CM | POA: Diagnosis not present

## 2021-04-02 DIAGNOSIS — J441 Chronic obstructive pulmonary disease with (acute) exacerbation: Secondary | ICD-10-CM | POA: Diagnosis not present

## 2021-04-02 DIAGNOSIS — K700 Alcoholic fatty liver: Secondary | ICD-10-CM | POA: Diagnosis not present

## 2021-04-02 DIAGNOSIS — J9621 Acute and chronic respiratory failure with hypoxia: Secondary | ICD-10-CM | POA: Diagnosis not present

## 2021-04-02 DIAGNOSIS — J189 Pneumonia, unspecified organism: Secondary | ICD-10-CM | POA: Diagnosis not present

## 2021-04-02 DIAGNOSIS — J449 Chronic obstructive pulmonary disease, unspecified: Secondary | ICD-10-CM | POA: Diagnosis not present

## 2021-04-02 DIAGNOSIS — N179 Acute kidney failure, unspecified: Secondary | ICD-10-CM | POA: Diagnosis not present

## 2021-04-02 DIAGNOSIS — N1832 Chronic kidney disease, stage 3b: Secondary | ICD-10-CM | POA: Diagnosis not present

## 2021-04-03 DIAGNOSIS — N179 Acute kidney failure, unspecified: Secondary | ICD-10-CM | POA: Diagnosis not present

## 2021-04-03 DIAGNOSIS — J9621 Acute and chronic respiratory failure with hypoxia: Secondary | ICD-10-CM | POA: Diagnosis not present

## 2021-04-03 DIAGNOSIS — J189 Pneumonia, unspecified organism: Secondary | ICD-10-CM | POA: Diagnosis not present

## 2021-04-03 DIAGNOSIS — K700 Alcoholic fatty liver: Secondary | ICD-10-CM | POA: Diagnosis not present

## 2021-04-03 DIAGNOSIS — J449 Chronic obstructive pulmonary disease, unspecified: Secondary | ICD-10-CM | POA: Diagnosis not present

## 2021-04-03 DIAGNOSIS — N1832 Chronic kidney disease, stage 3b: Secondary | ICD-10-CM | POA: Diagnosis not present

## 2021-04-03 DIAGNOSIS — J441 Chronic obstructive pulmonary disease with (acute) exacerbation: Secondary | ICD-10-CM | POA: Diagnosis not present

## 2021-04-04 DIAGNOSIS — J189 Pneumonia, unspecified organism: Secondary | ICD-10-CM | POA: Diagnosis not present

## 2021-04-04 DIAGNOSIS — J449 Chronic obstructive pulmonary disease, unspecified: Secondary | ICD-10-CM | POA: Diagnosis not present

## 2021-04-04 DIAGNOSIS — K700 Alcoholic fatty liver: Secondary | ICD-10-CM | POA: Diagnosis not present

## 2021-04-04 DIAGNOSIS — R5381 Other malaise: Secondary | ICD-10-CM | POA: Diagnosis not present

## 2021-04-04 DIAGNOSIS — R4189 Other symptoms and signs involving cognitive functions and awareness: Secondary | ICD-10-CM | POA: Diagnosis not present

## 2021-04-04 DIAGNOSIS — N1832 Chronic kidney disease, stage 3b: Secondary | ICD-10-CM | POA: Diagnosis not present

## 2021-04-04 DIAGNOSIS — J441 Chronic obstructive pulmonary disease with (acute) exacerbation: Secondary | ICD-10-CM | POA: Diagnosis not present

## 2021-04-04 DIAGNOSIS — M6281 Muscle weakness (generalized): Secondary | ICD-10-CM | POA: Diagnosis not present

## 2021-04-04 DIAGNOSIS — N179 Acute kidney failure, unspecified: Secondary | ICD-10-CM | POA: Diagnosis not present

## 2021-04-04 DIAGNOSIS — R2689 Other abnormalities of gait and mobility: Secondary | ICD-10-CM | POA: Diagnosis not present

## 2021-04-04 DIAGNOSIS — J9621 Acute and chronic respiratory failure with hypoxia: Secondary | ICD-10-CM | POA: Diagnosis not present

## 2021-04-18 ENCOUNTER — Ambulatory Visit: Payer: Medicare Other | Admitting: Cardiology

## 2021-05-02 DEATH — deceased

## 2022-07-29 ENCOUNTER — Other Ambulatory Visit: Payer: Self-pay | Admitting: Podiatry
